# Patient Record
Sex: Female | Born: 1940
Health system: Southern US, Community
[De-identification: ages and names within clinical notes are randomized; demographics above are authoritative.]

## PROBLEM LIST (undated history)

## (undated) DIAGNOSIS — E119 Type 2 diabetes mellitus without complications: Secondary | ICD-10-CM

## (undated) DIAGNOSIS — M25569 Pain in unspecified knee: Secondary | ICD-10-CM

## (undated) DIAGNOSIS — I10 Essential (primary) hypertension: Secondary | ICD-10-CM

## (undated) DIAGNOSIS — Z923 Personal history of irradiation: Secondary | ICD-10-CM

## (undated) DIAGNOSIS — N3289 Other specified disorders of bladder: Secondary | ICD-10-CM

## (undated) DIAGNOSIS — M1990 Unspecified osteoarthritis, unspecified site: Secondary | ICD-10-CM

## (undated) DIAGNOSIS — E785 Hyperlipidemia, unspecified: Secondary | ICD-10-CM

## (undated) DIAGNOSIS — C50919 Malignant neoplasm of unspecified site of unspecified female breast: Secondary | ICD-10-CM

## (undated) DIAGNOSIS — T7840XA Allergy, unspecified, initial encounter: Secondary | ICD-10-CM

## (undated) HISTORY — DX: Other specified disorders of bladder: N32.89

## (undated) HISTORY — DX: Type 2 diabetes mellitus without complications: E11.9

## (undated) HISTORY — DX: Pain in unspecified knee: M25.569

## (undated) HISTORY — DX: Allergy, unspecified, initial encounter: T78.40XA

## (undated) HISTORY — DX: Hyperlipidemia, unspecified: E78.5

## (undated) HISTORY — DX: Unspecified osteoarthritis, unspecified site: M19.90

## (undated) HISTORY — DX: Malignant neoplasm of unspecified site of unspecified female breast: C50.919

## (undated) HISTORY — DX: Essential (primary) hypertension: I10

## (undated) HISTORY — PX: COLONOSCOPY: SHX174

---

## 1973-11-14 HISTORY — PX: ABDOMINAL HYSTERECTOMY: SHX81

## 1985-11-14 HISTORY — PX: BREAST LUMPECTOMY: SHX2

## 1986-07-15 HISTORY — PX: MASTECTOMY PARTIAL / LUMPECTOMY W/ AXILLARY LYMPHADENECTOMY: SUR852

## 1998-02-19 ENCOUNTER — Encounter: Admission: RE | Admit: 1998-02-19 | Discharge: 1998-05-20 | Payer: Self-pay | Admitting: General Surgery

## 1998-11-14 HISTORY — PX: SHOULDER ARTHROSCOPY: SHX128

## 1999-09-02 ENCOUNTER — Ambulatory Visit (HOSPITAL_BASED_OUTPATIENT_CLINIC_OR_DEPARTMENT_OTHER): Admission: RE | Admit: 1999-09-02 | Discharge: 1999-09-02 | Payer: Self-pay | Admitting: Orthopedic Surgery

## 2000-02-24 ENCOUNTER — Encounter: Admission: RE | Admit: 2000-02-24 | Discharge: 2000-02-24 | Payer: Self-pay | Admitting: Internal Medicine

## 2000-02-24 ENCOUNTER — Encounter: Payer: Self-pay | Admitting: Internal Medicine

## 2005-04-05 ENCOUNTER — Ambulatory Visit: Payer: Self-pay | Admitting: Internal Medicine

## 2005-04-18 ENCOUNTER — Ambulatory Visit: Payer: Self-pay | Admitting: Internal Medicine

## 2005-04-21 ENCOUNTER — Ambulatory Visit: Payer: Self-pay | Admitting: Internal Medicine

## 2005-04-27 ENCOUNTER — Ambulatory Visit: Payer: Self-pay | Admitting: Internal Medicine

## 2005-05-10 ENCOUNTER — Encounter (INDEPENDENT_AMBULATORY_CARE_PROVIDER_SITE_OTHER): Payer: Self-pay | Admitting: Specialist

## 2005-05-10 ENCOUNTER — Ambulatory Visit: Payer: Self-pay | Admitting: Internal Medicine

## 2005-07-20 ENCOUNTER — Emergency Department (HOSPITAL_COMMUNITY): Admission: EM | Admit: 2005-07-20 | Discharge: 2005-07-20 | Payer: Self-pay | Admitting: Emergency Medicine

## 2006-01-13 ENCOUNTER — Ambulatory Visit: Payer: Self-pay | Admitting: Internal Medicine

## 2006-05-25 ENCOUNTER — Emergency Department (HOSPITAL_COMMUNITY): Admission: EM | Admit: 2006-05-25 | Discharge: 2006-05-25 | Payer: Self-pay | Admitting: Emergency Medicine

## 2006-05-30 ENCOUNTER — Emergency Department (HOSPITAL_COMMUNITY): Admission: EM | Admit: 2006-05-30 | Discharge: 2006-05-30 | Payer: Self-pay | Admitting: Family Medicine

## 2006-11-08 ENCOUNTER — Ambulatory Visit: Payer: Self-pay | Admitting: Internal Medicine

## 2007-01-01 ENCOUNTER — Ambulatory Visit: Payer: Self-pay | Admitting: Internal Medicine

## 2007-01-01 LAB — CONVERTED CEMR LAB
BUN: 8 mg/dL (ref 6–23)
Basophils Absolute: 0 10*3/uL (ref 0.0–0.1)
Basophils Relative: 0.8 % (ref 0.0–1.0)
CO2: 30 meq/L (ref 19–32)
Calcium: 9.5 mg/dL (ref 8.4–10.5)
Chloride: 109 meq/L (ref 96–112)
Cholesterol: 175 mg/dL (ref 0–200)
Creatinine, Ser: 0.7 mg/dL (ref 0.4–1.2)
Direct LDL: 111.5 mg/dL
Eosinophils Absolute: 0.1 10*3/uL (ref 0.0–0.6)
Eosinophils Relative: 1.6 % (ref 0.0–5.0)
GFR calc Af Amer: 108 mL/min
GFR calc non Af Amer: 89 mL/min
Glucose, Bld: 148 mg/dL — ABNORMAL HIGH (ref 70–99)
HCT: 41 % (ref 36.0–46.0)
HDL: 37.3 mg/dL — ABNORMAL LOW (ref >39.0)
Hemoglobin: 14.4 g/dL (ref 12.0–15.0)
Lymphocytes Relative: 45.6 % (ref 12.0–46.0)
MCHC: 35.1 g/dL (ref 30.0–36.0)
MCV: 91.6 fL (ref 78.0–100.0)
Monocytes Absolute: 0.2 10*3/uL (ref 0.2–0.7)
Monocytes Relative: 4.6 % (ref 3.0–11.0)
Neutro Abs: 2.1 10*3/uL (ref 1.4–7.7)
Neutrophils Relative %: 47.4 % (ref 43.0–77.0)
Platelets: 218 10*3/uL (ref 150–400)
Potassium: 4.2 meq/L (ref 3.5–5.1)
RBC: 4.48 M/uL (ref 3.87–5.11)
RDW: 11.8 % (ref 11.5–14.6)
Sodium: 143 meq/L (ref 135–145)
TSH: 1.37 microintl units/mL (ref 0.35–5.50)
Total CHOL/HDL Ratio: 4.7
Triglycerides: 208 mg/dL (ref 0–149)
VLDL: 42 mg/dL — ABNORMAL HIGH (ref 0–40)
Vitamin B-12: 741 pg/mL (ref 211–911)
WBC: 4.5 10*3/uL (ref 4.5–10.5)

## 2007-08-28 ENCOUNTER — Ambulatory Visit: Payer: Self-pay | Admitting: Internal Medicine

## 2007-09-10 ENCOUNTER — Observation Stay (HOSPITAL_COMMUNITY): Admission: EM | Admit: 2007-09-10 | Discharge: 2007-09-11 | Payer: Self-pay | Admitting: Emergency Medicine

## 2007-09-11 ENCOUNTER — Ambulatory Visit: Payer: Self-pay | Admitting: Internal Medicine

## 2007-09-13 DIAGNOSIS — Z853 Personal history of malignant neoplasm of breast: Secondary | ICD-10-CM

## 2007-09-13 DIAGNOSIS — E785 Hyperlipidemia, unspecified: Secondary | ICD-10-CM | POA: Insufficient documentation

## 2007-09-13 DIAGNOSIS — I10 Essential (primary) hypertension: Secondary | ICD-10-CM

## 2007-09-13 DIAGNOSIS — M1990 Unspecified osteoarthritis, unspecified site: Secondary | ICD-10-CM | POA: Insufficient documentation

## 2007-09-28 ENCOUNTER — Ambulatory Visit: Payer: Self-pay

## 2007-10-01 ENCOUNTER — Ambulatory Visit: Payer: Self-pay | Admitting: Internal Medicine

## 2007-10-01 DIAGNOSIS — E119 Type 2 diabetes mellitus without complications: Secondary | ICD-10-CM

## 2007-10-02 DIAGNOSIS — Z9189 Other specified personal risk factors, not elsewhere classified: Secondary | ICD-10-CM

## 2007-10-26 ENCOUNTER — Ambulatory Visit: Payer: Self-pay | Admitting: Internal Medicine

## 2007-10-26 DIAGNOSIS — T7841XA Arthus phenomenon, initial encounter: Secondary | ICD-10-CM | POA: Insufficient documentation

## 2007-10-26 LAB — CONVERTED CEMR LAB
ALT: 25 units/L (ref 0–35)
AST: 22 units/L (ref 0–37)
Albumin: 3.8 g/dL (ref 3.5–5.2)
Alkaline Phosphatase: 51 units/L (ref 39–117)
Bilirubin, Direct: 0.2 mg/dL (ref 0.0–0.3)
Cholesterol: 115 mg/dL (ref 0–200)
HDL: 33.8 mg/dL — ABNORMAL LOW (ref >39.0)
LDL Cholesterol: 54 mg/dL (ref 0–99)
Total Bilirubin: 0.9 mg/dL (ref 0.3–1.2)
Total CHOL/HDL Ratio: 3.4
Total Protein: 6.7 g/dL (ref 6.0–8.3)
Triglycerides: 135 mg/dL (ref 0–149)
VLDL: 27 mg/dL (ref 0–40)

## 2007-10-28 ENCOUNTER — Encounter: Payer: Self-pay | Admitting: Internal Medicine

## 2007-11-02 ENCOUNTER — Ambulatory Visit: Payer: Self-pay | Admitting: Internal Medicine

## 2008-01-05 ENCOUNTER — Emergency Department (HOSPITAL_COMMUNITY): Admission: EM | Admit: 2008-01-05 | Discharge: 2008-01-05 | Payer: Self-pay | Admitting: Family Medicine

## 2008-01-29 ENCOUNTER — Encounter: Payer: Self-pay | Admitting: Internal Medicine

## 2008-05-23 ENCOUNTER — Telehealth (INDEPENDENT_AMBULATORY_CARE_PROVIDER_SITE_OTHER): Payer: Self-pay | Admitting: *Deleted

## 2008-06-03 ENCOUNTER — Ambulatory Visit: Payer: Self-pay | Admitting: Internal Medicine

## 2008-06-03 DIAGNOSIS — N3289 Other specified disorders of bladder: Secondary | ICD-10-CM

## 2008-06-03 DIAGNOSIS — M25569 Pain in unspecified knee: Secondary | ICD-10-CM

## 2008-08-12 ENCOUNTER — Ambulatory Visit: Payer: Self-pay | Admitting: Internal Medicine

## 2008-10-21 ENCOUNTER — Ambulatory Visit: Payer: Self-pay | Admitting: Internal Medicine

## 2008-10-23 ENCOUNTER — Encounter: Payer: Self-pay | Admitting: Internal Medicine

## 2009-02-11 ENCOUNTER — Encounter: Payer: Self-pay | Admitting: Internal Medicine

## 2009-02-11 LAB — HM MAMMOGRAPHY: HM Mammogram: NORMAL

## 2009-06-08 ENCOUNTER — Ambulatory Visit: Payer: Self-pay | Admitting: Internal Medicine

## 2009-07-30 ENCOUNTER — Ambulatory Visit: Payer: Self-pay | Admitting: Internal Medicine

## 2009-07-30 LAB — CONVERTED CEMR LAB
Albumin: 4.2 g/dL (ref 3.5–5.2)
Alkaline Phosphatase: 62 units/L (ref 39–117)
Basophils Relative: 0.7 % (ref 0.0–3.0)
CO2: 29 meq/L (ref 19–32)
Chloride: 106 meq/L (ref 96–112)
Eosinophils Absolute: 0.1 10*3/uL (ref 0.0–0.7)
HCT: 41.5 % (ref 36.0–46.0)
HDL: 36.6 mg/dL — ABNORMAL LOW (ref >39.00)
Hemoglobin: 14.3 g/dL (ref 12.0–15.0)
LDL Cholesterol: 74 mg/dL (ref 0–99)
MCHC: 34.3 g/dL (ref 30.0–36.0)
MCV: 93 fL (ref 78.0–100.0)
Monocytes Absolute: 0.2 10*3/uL (ref 0.1–1.0)
Neutro Abs: 1.9 10*3/uL (ref 1.4–7.7)
Potassium: 4.3 meq/L (ref 3.5–5.1)
RBC: 4.47 M/uL (ref 3.87–5.11)
Sodium: 141 meq/L (ref 135–145)
Total CHOL/HDL Ratio: 4
Triglycerides: 163 mg/dL — ABNORMAL HIGH (ref 0.0–149.0)

## 2009-08-04 ENCOUNTER — Encounter: Payer: Self-pay | Admitting: Internal Medicine

## 2009-08-11 ENCOUNTER — Telehealth: Payer: Self-pay | Admitting: Internal Medicine

## 2009-08-12 ENCOUNTER — Ambulatory Visit: Payer: Self-pay | Admitting: Internal Medicine

## 2009-08-12 DIAGNOSIS — J309 Allergic rhinitis, unspecified: Secondary | ICD-10-CM | POA: Insufficient documentation

## 2009-08-13 ENCOUNTER — Encounter: Payer: Self-pay | Admitting: Internal Medicine

## 2009-09-07 ENCOUNTER — Telehealth: Payer: Self-pay | Admitting: Internal Medicine

## 2009-09-24 ENCOUNTER — Ambulatory Visit: Payer: Self-pay | Admitting: Internal Medicine

## 2009-09-29 ENCOUNTER — Ambulatory Visit: Payer: Self-pay | Admitting: Internal Medicine

## 2009-09-29 LAB — CONVERTED CEMR LAB: Blood Glucose, AC Bkfst: 153 mg/dL

## 2009-10-14 ENCOUNTER — Ambulatory Visit: Payer: Self-pay | Admitting: Internal Medicine

## 2010-02-04 ENCOUNTER — Ambulatory Visit: Payer: Self-pay | Admitting: Internal Medicine

## 2010-03-25 ENCOUNTER — Encounter: Payer: Self-pay | Admitting: Internal Medicine

## 2010-03-30 ENCOUNTER — Encounter: Payer: Self-pay | Admitting: Internal Medicine

## 2010-04-22 ENCOUNTER — Ambulatory Visit: Payer: Self-pay | Admitting: Internal Medicine

## 2010-04-22 DIAGNOSIS — M545 Low back pain: Secondary | ICD-10-CM | POA: Insufficient documentation

## 2010-04-23 LAB — CONVERTED CEMR LAB
Ketones, ur: NEGATIVE mg/dL
Leukocytes, UA: NEGATIVE
Specific Gravity, Urine: 1.015 (ref 1.000–1.030)
Urine Glucose: NEGATIVE mg/dL
pH: 7 (ref 5.0–8.0)

## 2010-04-27 ENCOUNTER — Encounter (INDEPENDENT_AMBULATORY_CARE_PROVIDER_SITE_OTHER): Payer: Self-pay | Admitting: *Deleted

## 2010-05-19 ENCOUNTER — Encounter (INDEPENDENT_AMBULATORY_CARE_PROVIDER_SITE_OTHER): Payer: Self-pay | Admitting: *Deleted

## 2010-07-01 ENCOUNTER — Encounter (INDEPENDENT_AMBULATORY_CARE_PROVIDER_SITE_OTHER): Payer: Self-pay

## 2010-07-06 ENCOUNTER — Ambulatory Visit: Payer: Self-pay | Admitting: Internal Medicine

## 2010-07-13 ENCOUNTER — Ambulatory Visit: Payer: Self-pay | Admitting: Internal Medicine

## 2010-07-13 LAB — HM COLONOSCOPY

## 2010-07-14 ENCOUNTER — Encounter: Payer: Self-pay | Admitting: Internal Medicine

## 2010-08-19 ENCOUNTER — Ambulatory Visit: Payer: Self-pay | Admitting: Internal Medicine

## 2010-10-11 ENCOUNTER — Ambulatory Visit: Payer: Self-pay | Admitting: Internal Medicine

## 2010-12-16 NOTE — Letter (Signed)
Summary: Previsit letter  [facility]  [address] [place], [place] [address]   Phone: [phone]  Fax: [phone]       05/19/2010 MRN: [id number]  [facility] [address] [place], [place]  [address]  Dear Kelly Bell,  Welcome to the Gastroenterology Division at [facility].    You are scheduled to see a nurse for your pre-procedure visit on 07/06/2010 at 10:30am on the 3rd floor at [facility], [address]  We ask that you try to arrive at our office 15 minutes prior to your appointment time to allow for check-in.  Your nurse visit will consist of discussing your medical and surgical history, your immediate family medical history, and your medications.    Please bring a complete list of all your medications or, if you prefer, bring the medication bottles and we will list them.  We will need to be aware of both prescribed and over the counter drugs.  We will need to know exact dosage information as well.  If you are on blood thinners (Coumadin, Plavix, Aggrenox, Ticlid, etc.) please call our office today/prior to your appointment, as we need to consult with your physician about holding your medication.   Please be prepared to read and sign documents such as consent forms, a financial agreement, and acknowledgement forms.  If necessary, and with your consent, a friend or relative is welcome to sit-in on the nurse visit with you.  Please bring your insurance card so that we may make a copy of it.  If your insurance requires a referral to see a specialist, please bring your referral form from your primary care physician.  No co-pay is required for this nurse visit.     If you cannot keep your appointment, please call [phone] to cancel or reschedule prior to your appointment date.  This allows [place] the opportunity to schedule an appointment for another patient in need of care.    Thank you for choosing [facility] for your medical  needs.  We appreciate the opportunity to care for you.  Please visit [place] at our website  to learn more about our practice.                     Sincerely.                                                                                                                   The Gastroenterology Division

## 2010-12-16 NOTE — Letter (Signed)
Summary: Colonoscopy Letter  [facility]  [address] [place], [place] [address]   Phone: [phone]  Fax: [phone]      April 27, 2010 MRN: [id number]   [facility] [address] [place], [place]  [address]   Dear Ms. Leahy,   According to your medical record, it is time for you to schedule a Colonoscopy. The American Cancer Society recommends this procedure as a method to detect early colon cancer. Patients with a family history of colon cancer, or a personal history of colon polyps or inflammatory bowel disease are at increased risk.  This letter has been generated based on the recommendations made at the time of your procedure. If you feel that in your particular situation this may no longer apply, please contact our office.  Please call our office at [phone] to schedule this appointment or to update your records at your earliest convenience.  Thank you for cooperating with [place] to provide you with the very best care possible.   Sincerely,  Wilhemina Bonito. Marina Goodell, M.D.  [facility] Division [phone]

## 2010-12-16 NOTE — Procedures (Signed)
Summary: Colonoscopy  Patient: Kelly Bell Note: All result statuses are Final unless otherwise noted.  Tests: (1) Colonoscopy (COL)   COL Colonoscopy           DONE     [facility]     [address]     [place], [place]  [address]           COLONOSCOPY PROCEDURE REPORT           PATIENT:  Kelly Bell, Kelly Bell  MR#:  [id number]     BIRTHDATE:  08-Jan-1941, 69 yrs. old  GENDER:  female     ENDOSCOPIST:  Wilhemina Bonito. Eda Keys, MD     REF. BY:  Surveillance Program Recall,     PROCEDURE DATE:  07/13/2010     PROCEDURE:  Colonoscopy with snare polypectomy x 3     ASA CLASS:  Class II     INDICATIONS:  history of pre-cancerous (adenomatous) colon polyps,     surveillance and high-risk screening ; index exam 04-2005 w/ TAs     MEDICATIONS:   Fentanyl 100 mcg IV, Versed 10 mg IV, Benadryl 25     mg IV           DESCRIPTION OF PROCEDURE:   After the risks benefits and     alternatives of the procedure were thoroughly explained, informed     consent was obtained.  Digital rectal exam was performed and     revealed no abnormalities.   The LB [id number] endoscope     was introduced through the anus and advanced to the cecum, which     was identified by both the appendix and ileocecal valve, without     limitations.Time to cecum = 5:08 min.  The quality of the prep was     excellent, using MoviPrep.  The instrument was then slowly     withdrawn (t = 10:48 min) as the colon was fully examined.     <<PROCEDUREIMAGES>>           FINDINGS:  Three polyps (63mm,1mm,3mm) were found in the ascending     colon. Polyps were snared without cautery. Retrieval was     successful. snare polyp  Moderate diverticulosis was found     throughout the colon.   Retroflexed views in the rectum revealed     internal hemorrhoids.    The scope was then withdrawn from the     patient and the procedure completed.           COMPLICATIONS:  None     ENDOSCOPIC IMPRESSION:     1) Three polyps in the ascending colon  - removed     2) Moderate diverticulosis throughout the colon     3) Internal hemorrhoids           RECOMMENDATIONS:     1) Follow up colonoscopy in 5 years           ______________________________     Wilhemina Bonito. Eda Keys, MD           CC:  Jacques Navy, MD;The Patient           n.     Rosalie DoctorWilhemina Bonito. Eda Keys at 07/13/2010 10:28 AM           [place], [place], [phone]  Note: An exclamation mark (!) indicates a result that was not dispersed into the flowsheet. Document Creation Date: 07/13/2010 10:29 AM _______________________________________________________________________  (1) Order result  status: Final Collection or observation date-time: 07/13/2010 10:20 Requested date-time:  Receipt date-time:  Reported date-time:  Referring Physician:   Ordering Physician: Fransico Setters [phone]) Specimen Source:  Source: Launa Grill Order Number: [id number] Lab site:   Appended Document: Colonoscopy     Procedures Next Due Date:    Colonoscopy: 06/2015

## 2010-12-16 NOTE — Letter (Signed)
Summary: Patient Notice- Polyp Results  [facility]  [address] [place], [place] [address]   Phone: [phone]  Fax: [phone]        July 14, 2010 MRN: [id number]    [facility] [address] [place], [place]  [address]    Dear Ms. Kostelnik,  I am pleased to inform you that the colon polyp(s) removed during your recent colonoscopy was (were) found to be benign (no cancer detected) upon pathologic examination.  I recommend you have a repeat colonoscopy examination in 5 years to look for recurrent polyps, as having colon polyps increases your risk for having recurrent polyps or even colon cancer in the future.  Should you develop new or worsening symptoms of abdominal pain, bowel habit changes or bleeding from the rectum or bowels, please schedule an evaluation with either your primary care physician or with me.  Additional information/recommendations:  __ No further action with gastroenterology is needed at this time. Please      follow-up with your primary care physician for your other healthcare      needs.    Please call us if you are having persistent problems or have questions about your condition that have not been fully answered at this time.  Sincerely,  Hilarie Fredrickson MD  This letter has been electronically signed by your physician.  Appended Document: Patient Notice- Polyp Results letter mailed 9.2.11

## 2010-12-16 NOTE — Letter (Signed)
Summary: Diabetic Instructions  [facility]  [address] [place], [place] [address]   Phone: [phone]  Fax: [phone]    Kelly Bell 1940-12-16 MRN: [id number]   _  _   ORAL DIABETIC MEDICATION INSTRUCTIONS  The day before your procedure:   Take your diabetic pill as you do normally  The day of your procedure:   Do not take your diabetic pill    We will check your blood sugar levels during the admission process and again in Recovery before discharging you home  ________________________________________________________________________  _  _   INSULIN (LONG ACTING) MEDICATION INSTRUCTIONS (Lantus, NPH, 70/30, Humulin, Novolin-N)   The day before your procedure:   Take  your regular evening dose    The day of your procedure:   Do not take your morning dose    _  _   INSULIN (SHORT ACTING) MEDICATION INSTRUCTIONS (Regular, Humulog, Novolog)   The day before your procedure:   Do not take your evening dose   The day of your procedure:   Do not take your morning dose   _  _   INSULIN PUMP MEDICATION INSTRUCTIONS  We will contact the physician managing your diabetic care for written dosage instructions for the day before your procedure and the day of your procedure.  Once we have received the instructions, we will contact you.

## 2010-12-16 NOTE — Letter (Signed)
Summary: [facility] Instructions  [facility]  [address] [place], [place] [address]   Phone: [phone]  Fax: [phone]       Kelly Bell    1940-12-08    MRN: [id number]        Procedure Day /Date: Tuesday 07/13/2010     Arrival Time: 8:30 am      Procedure Time: 9:30 am     Location of Procedure:                    _x _  [facility] (4th Floor)                        PREPARATION FOR COLONOSCOPY WITH MOVIPREP   Starting 5 days prior to your procedure Sunday 9/25 do not eat nuts, seeds, popcorn, corn, beans, peas,  salads, or any raw vegetables.  Do not take any fiber supplements (e.g. Metamucil, Citrucel, and Benefiber).  THE DAY BEFORE YOUR PROCEDURE         DATE: Monday 8/29  1.  Drink clear liquids the entire day-NO SOLID FOOD  2.  Do not drink anything colored red or purple.  Avoid juices with pulp.  No orange juice.  3.  Drink at least 64 oz. (8 glasses) of fluid/clear liquids during the day to prevent dehydration and help the prep work efficiently.  CLEAR LIQUIDS INCLUDE: Water Jello Ice Popsicles Tea (sugar ok, no milk/cream) Powdered fruit flavored drinks Coffee (sugar ok, no milk/cream) Gatorade Juice: apple, white grape, white cranberry  Lemonade Clear bullion, consomm, broth Carbonated beverages (any kind) Strained chicken noodle soup Hard Candy                             4.  In the morning, mix first dose of MoviPrep solution:    Empty 1 Pouch A and 1 Pouch B into the disposable container    Add lukewarm drinking water to the top line of the container. Mix to dissolve    Refrigerate (mixed solution should be used within 24 hrs)  5.  Begin drinking the prep at 5:00 p.m. The MoviPrep container is divided by 4 marks.   Every 15 minutes drink the solution down to the next mark (approximately 8 oz) until the full liter is complete.   6.  Follow completed prep with 16 oz of clear liquid of your choice (Nothing red or  purple).  Continue to drink clear liquids until bedtime.  7.  Before going to bed, mix second dose of MoviPrep solution:    Empty 1 Pouch A and 1 Pouch B into the disposable container    Add lukewarm drinking water to the top line of the container. Mix to dissolve    Refrigerate  THE DAY OF YOUR PROCEDURE      DATE: Tuesday 8/30  Beginning at 4:30 a.m. (5 hours before procedure):         1. Every 15 minutes, drink the solution down to the next mark (approx 8 oz) until the full liter is complete.  2. Follow completed prep with 16 oz. of clear liquid of your choice.    3. You may drink clear liquids until 7:30 am (2 HOURS BEFORE PROCEDURE).   MEDICATION INSTRUCTIONS  Unless otherwise instructed, you should take regular prescription medications with a small sip of water   as early as possible the morning of your  procedure.  Diabetic patients - see separate instructions.  Additional medication instructions: Do not take HCTZ am of procedure.         OTHER INSTRUCTIONS  You will need a responsible adult at least 71 years of age to accompany you and drive you home.   This person must remain in the waiting room during your procedure.  Wear loose fitting clothing that is easily removed.  Leave jewelry and other valuables at home.  However, you may wish to bring a book to read or  an iPod/MP3 player to listen to music as you wait for your procedure to start.  Remove all body piercing jewelry and leave at home.  Total time from sign-in until discharge is approximately 2-3 hours.  You should go home directly after your procedure and rest.  You can resume normal activities the  day after your procedure.  The day of your procedure you should not:   Drive   Make legal decisions   Operate machinery   Drink alcohol   Return to work  You will receive specific instructions about eating, activities and medications before you leave.    The above instructions have been  reviewed and explained to me by   Ulis Rias RN  July 06, 2010 10:41 AM    I fully understand and can verbalize these instructions _____________________________ Date _________

## 2010-12-16 NOTE — Assessment & Plan Note (Signed)
Summary: ZOSTAVAX INJ/ MEN /NWS  STATES BCBS WILL PAY/ NWS   Nurse Visit   Allergies: No Known Drug Allergies  Immunizations Administered:  Zostavax # 1:    Vaccine Type: Zostavax    Site: left deltoid    Mfr: Merck    Dose: 0.5 ml    Route: [place]    Given by: Lucious Groves    Exp. Date: 12/11/2010    Lot #: [id number]    VIS given: 08/26/05 given February 04, 2010.  Orders Added: 1)  Zoster (Shingles) Vaccine Live [90736] 2)  Admin 1st Vaccine [phone]

## 2010-12-16 NOTE — Assessment & Plan Note (Signed)
Summary: pain right side/men/cd   Vital Signs:  Patient profile:   70 year old female Height:      65 inches Weight:      239.25 pounds BMI:     39.96 O2 Sat:      96 % on Room air Temp:     98.4 degrees F oral Pulse rate:   71 / minute BP sitting:   148 / 94  (left arm) Cuff size:   large  Vitals Entered By: Lucious Groves (April 22, 2010 11:39 AM)  O2 Flow:  Room air CC: C/o right side pain./kb Is Patient Diabetic? Yes Pain Assessment Patient in pain? yes     Location: right side Intensity: 8 Type: aching Onset of pain  2 weeks Comments Patient notes that she stopped HCTZ and needs new 4mg  prescription for Amaryl./kb   Primary Care Provider:  [facility]  CC:  C/o right side pain./kb.  History of Present Illness: C/o R LS sharp severe pain, nagging x 2 wks: off and on; better w/Ibuprofen. No irrad. Pain 8/10 She needs a new Rx for DM  Preventive Screening-Counseling & Management  Caffeine-Diet-Exercise     Does Patient Exercise: yes  Current Medications (verified): 1)  Aspirin Ec 325 Mg  Tbec (Aspirin) .Marland Kitchen.. 1 By Mouth Once Daily 2)  Hydrochlorothiazide 25 Mg  Tabs (Hydrochlorothiazide) .... Once Daily 3)  Oxybutynin Chloride 5 Mg  Tb24 (Oxybutynin Chloride) .Marland Kitchen.. 1 By Mouth Three Times A Day or Qid As Need For Irritable  Bladder 4)  Simvastatin 80 Mg  Tabs (Simvastatin) .Marland Kitchen.. 1 Every Pm 5)  Metoprolol Tartrate 50 Mg  Tabs (Metoprolol Tartrate) .... Take 1 Tablet By Mouth Once A Day 6)  Amaryl 2 Mg Tabs (Glimepiride) .... 2 Daily 7)  Nasacort Aq 55 Mcg/act Aers (Triamcinolone Acetonide(Nasal)) .Marland Kitchen.. 1 Spray Each Nostril Once Daily  Allergies (verified): No Known Drug Allergies  Past History:  Past Medical History: Last updated: 08/12/2009 Breast cancer, hx of-Right Hyperlipidemia Hypertension diabetes mellitus, type 2 Usual childhood diseases irritable bladder knee pain  Past Surgical History: Last updated: 09/13/2007 Lumpectomy with axillary  dissection Hysterectomy- 1975 Left shoulder arthroscopy-2000 Colonoscopy-05/10/2005  Social History: married 1965 1 son, 1 daughter; 3 grandsons retired-worked in the school system; doing care-giver work for [employer] Regular exercise-yes water aerobics  Review of Systems  The patient denies fever, prolonged cough, abdominal pain, hematuria, and difficulty walking.         No rash  Physical Exam  General:  Overweight AA female in NADalert, well-hydrated, appropriate dress, and normal appearance.   Mouth:  teeth and gums in good repair; mucous membranes moist, without lesions or ulcers. oropharynx clear without exudate, erythema.  Heart:  normal rate, regular rhythm, no murmur, and no rub. BLE without edema. Abdomen:  soft, non-tender, normal bowel sounds, no masses, and no hepatomegaly.   Msk:  Lumbar-sacral spine is tender to palpation over paraspinal muscles and painfull with the ROM on R Neurologic:  alert & oriented X3, cranial nerves II-XII intact, strength normal in all extremities, gait normal, and DTRs symmetrical and normal.   Skin:  turgor normal, color normal, no ulcerations, and no edema.   Psych:  Oriented X3, memory intact for recent and remote, normally interactive, and good eye contact.     Impression & Recommendations:  Problem # 1:  LOW BACK PAIN, ACUTE (ICD-724.2) R, MSK Assessment New See "Patient Instructions".  Her updated medication list for this problem includes:    Aspirin  Ec 325 Mg Tbec (Aspirin) .Marland Kitchen... 1 by mouth once daily    Nabumetone 500 Mg Tabs (Nabumetone) .Marland Kitchen... 1 by mouth two times a day or 2 by mouth once daily pc for arthritis    Tramadol Hcl 50 Mg Tabs (Tramadol hcl) .Marland Kitchen... 1-2 tabs by mouth two times a day as needed pain  Orders: TLB-Udip ONLY (81003-UDIP) T-Lumbar Spine 2 Views (72100TC)  Problem # 2:  DIABETES MELLITUS, MILD (ICD-250.00) Assessment: Comment Only  The following medications were removed from the medication list:     Amaryl 2 Mg Tabs (Glimepiride) .Marland Kitchen... 2 daily Her updated medication list for this problem includes:    Aspirin Ec 325 Mg Tbec (Aspirin) .Marland Kitchen... 1 by mouth once daily    Glimepiride 4 Mg Tabs (Glimepiride) .Marland Kitchen... 1 by mouth once daily for diabetes  Problem # 3:  HYPERTENSION (ICD-401.9) Assessment: Comment Only  Her updated medication list for this problem includes:    Hydrochlorothiazide 25 Mg Tabs (Hydrochlorothiazide) ..... Once daily    Metoprolol Tartrate 50 Mg Tabs (Metoprolol tartrate) .Marland Kitchen... Take 1 tablet by mouth once a day  BP today: 148/94 Prior BP: 120/82 (08/12/2009)  Labs Reviewed: K+: 4.3 (07/30/2009) Creat: : 0.8 (07/30/2009)   Chol: 143 (07/30/2009)   HDL: 36.60 (07/30/2009)   LDL: 74 (07/30/2009)   TG: 163.0 (07/30/2009)  Complete Medication List: 1)  Aspirin Ec 325 Mg Tbec (Aspirin) .Marland Kitchen.. 1 by mouth once daily 2)  Hydrochlorothiazide 25 Mg Tabs (Hydrochlorothiazide) .... Once daily 3)  Oxybutynin Chloride 5 Mg Tb24 (Oxybutynin chloride) .Marland Kitchen.. 1 by mouth three times a day or qid as need for irritable  bladder 4)  Simvastatin 80 Mg Tabs (Simvastatin) .Marland Kitchen.. 1 every pm 5)  Metoprolol Tartrate 50 Mg Tabs (Metoprolol tartrate) .... Take 1 tablet by mouth once a day 6)  Nasacort Aq 55 Mcg/act Aers (Triamcinolone acetonide(nasal)) .Marland Kitchen.. 1 spray each nostril once daily 7)  Nabumetone 500 Mg Tabs (Nabumetone) .Marland Kitchen.. 1 by mouth two times a day or 2 by mouth once daily pc for arthritis 8)  Tramadol Hcl 50 Mg Tabs (Tramadol hcl) .Marland Kitchen.. 1-2 tabs by mouth two times a day as needed pain 9)  Glimepiride 4 Mg Tabs (Glimepiride) .Marland Kitchen.. 1 by mouth once daily for diabetes  Patient Instructions: 1)  Use stretching and balance exercises that I have provided (15 min. or longer every day)  2)  Please schedule a follow-up appointment in 1 month Dr Debby Bud. Prescriptions: GLIMEPIRIDE 4 MG TABS (GLIMEPIRIDE) 1 by mouth once daily for diabetes  #30 x 12   Entered and Authorized by:   Tresa Garter  MD   Signed by:   Tresa Garter MD on 04/22/2010   Method used:   Electronically to        [facility] (retail)       [address]       [place], [place]  [address]       Ph: [phone]       Fax: [phone]   RxID:   [id number] TRAMADOL HCL 50 MG TABS (TRAMADOL HCL) 1-2 tabs by mouth two times a day as needed pain  #60 x 3   Entered and Authorized by:   Tresa Garter MD   Signed by:   Tresa Garter MD on 04/22/2010   Method used:   Electronically to        [facility] (retail)  [address]       [place], [place]  [address]       Ph: [phone]       Fax: [phone]   RxID:   [id number] NABUMETONE 500 MG TABS (NABUMETONE) 1 by mouth two times a day or 2 by mouth once daily pc for arthritis  #60 x 6   Entered and Authorized by:   Tresa Garter MD   Signed by:   Tresa Garter MD on 04/22/2010   Method used:   Electronically to        [facility] (retail)       [address]       [place], [place]  [address]       Ph: [phone]       Fax: [phone]   RxID:   [id number]

## 2010-12-16 NOTE — Assessment & Plan Note (Signed)
Summary: TB TEST / MEN /NWS   Nurse Visit   Allergies: No Known Drug Allergies  Immunizations Administered:  PPD Skin Test:    Vaccine Type: PPD    Site: left forearm    Dose: 0.1 ml    Route: ID    Given by: Lamar Sprinkles, CMA    Exp. Date: 09/26/2011    Lot #: [id number]  PPD Results    Date of reading: 10/13/2010    Results: < 5mm    Interpretation: negative  Orders Added: 1)  TB Skin Test [86580] 2)  Admin 1st Vaccine [90471]   Orders Added: 1)  TB Skin Test [86580] 2)  Admin 1st Vaccine [62130]

## 2010-12-16 NOTE — Assessment & Plan Note (Signed)
Summary: FLU SHOT/NWS  Nurse Visit   Allergies: No Known Drug Allergies  Orders Added: 1)  Flu Vaccine 3yrs + MEDICARE PATIENTS [Q2039] 2)  Administration Flu vaccine - MCR [G0008] Flu Vaccine Consent Questions     Do you have a history of severe allergic reactions to this vaccine? no    Any prior history of allergic reactions to egg and/or gelatin? no    Do you have a sensitivity to the preservative Thimersol? no    Do you have a past history of Guillan-Barre Syndrome? no    Do you currently have an acute febrile illness? no    Have you ever had a severe reaction to latex? no    Vaccine information given and explained to patient? yes    Are you currently pregnant? no    Lot Number:[id number]   Exp Date:05/14/2011   Site Given  Left Deltoid IM 

## 2010-12-16 NOTE — Miscellaneous (Signed)
Summary: Lec previsit  Clinical Lists Changes  Medications: Added new medication of MOVIPREP 100 GM  SOLR (PEG-KCL-NACL-NASULF-NA ASC-C) As per prep instructions. - Signed Rx of MOVIPREP 100 GM  SOLR (PEG-KCL-NACL-NASULF-NA ASC-C) As per prep instructions.;  #1 x 0;  Signed;  Entered by: Ulis Rias RN;  Authorized by: Hilarie Fredrickson MD;  Method used: Electronically to [facility] [address] [place], [place], [place]  [address], Ph: [phone], Fax: [phone] Observations: Added new observation of NKA: T (07/06/2010 10:19)    Prescriptions: MOVIPREP 100 GM  SOLR (PEG-KCL-NACL-NASULF-NA ASC-C) As per prep instructions.  #1 x 0   Entered by:   Ulis Rias RN   Authorized by:   Hilarie Fredrickson MD   Signed by:   Ulis Rias RN on 07/06/2010   Method used:   Electronically to        [facility] (retail)       [address]       [place], [place]  [address]       Ph: [phone]       Fax: [phone]   RxID:   [id number]

## 2011-01-28 LAB — GLUCOSE, CAPILLARY: Glucose-Capillary: 119 mg/dL — ABNORMAL HIGH (ref 70–99)

## 2011-03-03 ENCOUNTER — Ambulatory Visit (INDEPENDENT_AMBULATORY_CARE_PROVIDER_SITE_OTHER): Payer: Medicare Other | Admitting: Internal Medicine

## 2011-03-03 ENCOUNTER — Encounter: Payer: Self-pay | Admitting: Internal Medicine

## 2011-03-03 VITALS — BP 138/80 | HR 60 | Temp 98.7°F | Wt 236.0 lb

## 2011-03-03 DIAGNOSIS — M76829 Posterior tibial tendinitis, unspecified leg: Secondary | ICD-10-CM

## 2011-03-04 ENCOUNTER — Encounter: Payer: Self-pay | Admitting: Internal Medicine

## 2011-03-04 NOTE — Progress Notes (Signed)
  Subjective:    Patient ID: Kelly Bell, female    DOB: [birth year], 70 y.o.   MRN: [id number]  HPIMrs. Bell presents for a 1-2 week history of pin in the distal right leg. She does not recall any precipitating event. She has had no injury. She denies any shortness of breath or any respiratory difficulty. She describes the pain as sharp and persistent that involves the lateral aspect of the distal right lower extremity up to the level of her knee. She does have pain with weight=bearing but has been unimpeded in ambulation. She reports swelling of both feet but no swelling of the calve RLE.   PMH, FamHx and SocHx reviewed for any changes and relevance.     Review of Systems  HENT: Negative.   Eyes: Negative.   Respiratory: Negative.   Cardiovascular: Negative.   Genitourinary: Negative.   Musculoskeletal:       Per the HPI  Neurological: Negative.        Objective:   Physical Exam Morbidly obese AA woman in no distress HEENT - wnl Chest - CTAP Ext- right Lower extremity with tenderness in the lateral compartment without mass. Tender at the lateral knee at insertion of tibialis muscle. No palpable mass, negative Homan's sign. 2+ dorsalis pedis pulse. 1+ pedal edema bilaterally.       Assessment & Plan:  1. Tibialis anterior tendonitis.  Plan - lineament of choice           OTC NSAID - GI precautions given           Stretch exercises - referred to [email or web address]

## 2011-03-29 NOTE — H&P (Signed)
NAMESHAY, JHAVERI                  ACCOUNT NO.:  [id number]   MEDICAL RECORD NO.:  [id number]          PATIENT TYPE:  INP   LOCATION:  1426                         FACILITY:  [facility]   PHYSICIAN:  Vinnie Level, MD    DATE OF BIRTH:  [birth year]   DATE OF ADMISSION:  09/09/2007  DATE OF DISCHARGE:                              HISTORY & PHYSICAL   [place] HISTORY AND PHYSICAL.   CHIEF COMPLAINT:  Chest pain.   HISTORY OF PRESENT ILLNESS:  This is a 70 year old African-American  female with history of hypertension, hyperlipidemia, who presents with  several hours of sharp 10/10 substernal chest pain that radiates to her  right shoulder.  She notes this started at rest and lasted several  minutes into hours.  Did subside a few times completely, then returned.  She had no associated diaphoresis, nausea or dyspnea with this.  She has  never had similar symptoms in the past.  She previously was doing water  aerobics but no recent exercise or exertional ability to test whether  this pain is with exertion.  She does her routine activities of daily  living around the house, including laundry, without issues.   PAST MEDICAL HISTORY:  1. Breast cancer.  2. Hypertension.  3. Hyperlipidemia.   CARDIAC RISK FACTORS:  No tobacco.  No family history.  Positive  hypertension, positive hyperlipidemia.  No formal diagnosis of diabetes.   FAMILY HISTORY:  She does have diabetes in both parents.  No frank  coronary disease, however.  There is remote cancer as well and the  patient herself has had breast cancer.   SOCIAL HISTORY:  The patient denies tobacco, alcohol, drug use.  Her  daughter and son are present at the bedside.   REVIEW OF SYSTEMS:  As per HPI.  All other systems are reviewed and are  negative except as noted above.  The patient notes no recent weight loss  or weight gain, fevers, chills, no recent changes in vision, hearing,  swallowing, no chest pain prior to this episode.  No  frank palpitations.  No dyspnea on exertion.  She lays flat in bed.  No orthopnea.  No lower  extremity edema.  She denies any nausea, vomiting, diarrhea.  No change  in bowel habits or bladder habits.  No hematuria. No hematochezia.  Her  bones and joints appear intact. Her mood is normal.  She has no evidence  of depression or anxiety.  She has had no recent easy bruising.  No  polyuria, no polydipsia, no recent changes in her hair.  Again, all  systems are reviewed and negative except as noted above.   MEDICATIONS:  Her medications include:  1. Lovastatin 40 mg p.o. daily.  2. Aspirin 325 mg p.o. daily.  3. Metoprolol 50 mg p.o. daily.  4. She notes she is not taking her hydrochlorothiazide as was      prescribed in the past.  5. She also takes oxybutynin 5 mg p.o. b.i.d. for symptoms with      irritable bladder, but only takes this on an occasional  basis.   ALLERGIES:  No known drug allergies.   PHYSICAL EXAMINATION:  VITAL SIGNS:  Temperature 98.2, blood pressure  ranged from 133/64 to 165/64.  Respiratory rate 26 to20.  Sating 97 to  98% on room air.  GENERAL:  She did initially present with 10/10 chest pain.  Generally,  well-developed, well-nourished obese African-American female in no acute  distress.  She initially complained of 10/10 pain which quickly resolved  with nitro paste placed on her chest.  She has had no further pain since  being here in the emergency department.  She does not appear to be using  accessory respiratory muscles.  HEENT:  Normocephalic, atraumatic.  Extraocular movements intact.  Pupils equal, round, reactive to light and accommodation.  NECK:  No jugular venous distention.  No bruits.  No cervical  adenopathy.  CARDIOVASCULAR:  Regular rate and rhythm.  Mild bradycardia at times.  No murmurs, rubs or gallops.  LUNGS:  Clear to auscultation bilaterally.  ABDOMEN:  Obese, nontender.  Nondistended.  Unable to reproduce any pain  with palpation.   EXTREMITIES:  Trace edema bilaterally.  MUSCULOSKELETAL:  All bones are palpated and within normal limits.  NEUROLOGICAL:  Cranial nerves II-XII grossly intact.  Good strength x4.  EXTREMITIES:  Skin without rash.   LABORATORY DATA:  An EKG appears to be sinus bradycardia with no ST or T  wave changes.  Normal axis.  Normal intervals.  A chest x-ray reveals  bibasilar atelectasis but no acute other pulmonary disease.  White count  is 6.7, hemoglobin is 13.9, hematocrit 39.8, platelet count 212,000.  Neutrophils are 40%, lymphocytes 52%, monocytes 6%, eosinophils 1%.  A D-  dimer is normal at 0.37.  Myoglobin 60.3.  CK-MB is less than 1.0 and  troponin is less than 0.05, both of which are normal.  BMP reveals  sodium 140, potassium 4.2, chloride 107, bicarb 25, BUN 11, creatinine  0.8, glucose 105, calcium 10.1.   EMERGENCY ROOM COURSE:  In the emergency department she was given  aspirin, sublingual  nitroglycerin, nitro paste and oxygen.   ASSESSMENT/PLAN:  This is a 70 year old African-American female with  hypertension, hyperlipidemia presenting with nonspecific chest pain with  normal electrocardiogram and negative enzymes x1 set.   PROBLEM #1:  CHEST PAIN:  Rule out myocardial infarction with telemetry,  electrocardiogram, serial cardiac enzymes.  Will continue with aspirin  and beta blocker.  She appears to be well beta-blocked at the present  time with heart rate less than 60.  Will keep her NPO for likely a  stress test in the morning, (DSE versus ESE).  Sublingual nitroglycerin  p.r.n.  I see no need for anticoagulation with Lovenox at the present  time.   PROBLEM #2:  HYPERLIPIDEMIA:  Continue with her Statin.  Check fasting  lipid profile.   PROBLEM #3:  PROPHYLAXIS:  Lovenox subcutaneously.   PROBLEM #4:  ENDOCRINE:  Check hemoglobin A1C.   PROBLEM #5:  FLUIDS, ELECTROLYTES, NUTRITION:  As I stated above, NPO.  Electrolytes otherwise appear to be within normal  limits.   DISPOSITION:  Telemetry bed, [place] attending Dr. Felicity Coyer.      Vinnie Level, MD  Electronically Signed     PMB/MEDQ  D:  09/10/2007  T:  09/10/2007  Job:  [id number]

## 2011-03-29 NOTE — Discharge Summary (Signed)
NAME:  Kelly Bell, Kelly Bell                  ACCOUNT NO.:  [id number]   MEDICAL RECORD NO.:  [id number]          PATIENT TYPE:  INP   LOCATION:  1426                         FACILITY[facility]   PHYSICIAN:  Valerie A. Felicity Coyer, MDDATE OF BIRTH:  November 12, 1941   DATE OF ADMISSION:  09/10/2007  DATE OF DISCHARGE:  09/11/2007                               DISCHARGE SUMMARY   DISCHARGE DIAGNOSES:  1. Chest pain, atypical.  Rule out myocardial infarction negative per      outpatient Cardiolite.  2. History of hypertension.  3. History of dyslipidemia.  4. History of breast cancer.   DISCHARGE MEDICATIONS:  1. Aspirin 325 mg once daily.  2. Metoprolol 50 mg once daily.  3. Lovastatin 40 mg once daily.   DISPOSITION:  The patient is discharged home in medically stable  condition.  She is asymptomatic and has had no recurrent chest pressure  or pain during this hospitalization.  Hospital followup is scheduled  with primary care physician, Dr. Illene Regulus, for Wednesday, November  5th, at 4:00 p.m.  She will also be contacted by [facility] for  a scheduled exercise Cardiolite in the coming week.  She is instructed  that if she does not receive information regarding this appointment, she  should call their office or primary MD to find more information  regarding scheduling.  She is also instructed that if she has recurrent  pain or trouble breathing, she should call MD or return to the emergency  room for further evaluation.   HOSPITAL COURSE:  Chest pain.  The patient is a 70 year old woman with  hypertension and dyslipidemia, but who has no other known history of  coronary disease, presented to the emergency room late the evening of  the 26the and was admitted on the 27th for evaluation of sharp  substernal chest pain.  The symptoms came and left suddenly, and without  associated symptoms of diaphoresis, nausea, shortness of breath, or neck  or arm pain.  Initial evaluation was  unremarkable, but she was admitted  for observation to rule out ischemia.  D-dimer was normal at 0.37, so a  CT angio was not pursued, and she remained hemodynamically stable with  normal O2 sats and no recurrence of the symptoms during this time.  She  was monitor on tele which was unremarkable, and serial cardiac enzymes  also returned without evidence of cardiac ischemia.  As such, given the  resolution of her symptoms, hemodynamic stability and negative workup at  this time, she is being arranged for an exercise Cardiolite to be  performed on an outpatient basis by [facility], as well as close  followup with her primary MD.  Please see details above regarding the  scheduling of these.  She will continue her home medications as before  per management of her  hypertension and dyslipidemia.  It should be noted a fasting lipid  profile was checked and showed suboptimal control with a total  cholesterol of 208, triglycerides of 239, HDL of 40 and LDL of 120.  I  will, however, not change  statin, and defer this to her primary MD in  followup next week.  All other medical issues are stable or as listed.Vikki Ports A. Felicity Coyer, MD  Electronically Signed     VAL/MEDQ  D:  09/11/2007  T:  09/11/2007  Job:  [id number]

## 2011-04-01 NOTE — Assessment & Plan Note (Signed)
[facility]                           PRIMARY CARE OFFICE NOTE   NAME:Kelly Bell, Kelly Bell                         MRN:          [id number]  DATE:01/01/2007                            DOB:          [birth year]    Kelly Bell is a 70 year old African American woman who presents for a  followup evaluation and exam. She is followed for hyperlipidemia and  hypertension.  Patient was last seen in the office January 13, 2006 for  headache, evaluated by Dr. Jonny Ruiz, treated for otitis media with a Z-Pak.   PAST MEDICAL HISTORY:   SURGICAL:  1. Lumpectomy with axillary dissection.  2. Vaginal hysterectomy in 1975.  3. Left shoulder arthroscopy in 2000.   MEDICAL ILLNESS:  1. Usual childhood diseases.  2. Hyperlipidemia.  3. Hypertension.  4. Degenerative joint disease.  5. Breast cancer on the right.   CURRENT MEDICATIONS:  1. Aspirin 325 mg daily.  2. Metoprolol 50 mg daily.  3. Lovastatin 40 mg daily.   CHIEF COMPLAINT:  Patient reports she is fatigued. She reports that she  has frequency and urgency with micturition, which is somewhat  problematic for her.   HEALTH MAINTENANCE:  The patient's last mammogram was March of 2007 and  was unremarkable. Last colonoscopy was June of 2006, and was significant  for colon polyps that returned as 1 adenomatous polyp and 1 polypoid  fragment and she is to be a 5-year recall.   SOCIAL HISTORY:  Patient is now retired since 2003. She reports she  tired of being retired and is looking for some type of part time  volunteer work. [facility] [employer] participating in  water aerobics on a regular basis. She reports her marriage is in good  health. She does continue to garden.   REVIEW OF SYSTEMS:  Negative for any constitutional, Cardiovascular,  respiratory, GI, or GU problems except for urinary frequency. Patient is  due for ophthalmologic exam.   PHYSICAL EXAMINATION:  Temperature 98.5, blood pressure  172/82, pulse  60, weight 232.  GENERAL APPEARANCE: An overweight African American woman in no acute  distress.  HEENT EXAM: Normocephalic, atraumatic, EACs and TMs were normal.  Oropharynx with full dentures.  No buccal lesions were noted. Posterior  pharynx was clear.  Conjunctivae and sclera were clear. PERRLA: EOMI  funduscopic exam was unremarkable.  NECK: Supple without thyromegaly. Nodes, no adenopathy was noted in the  cervical supraclavicular regions.  CHEST:  No CVA tenderness, no deformity.  LUNGS: Clear with no rales, wheezes, or rhonchi.  BREAST EXAM: Skin was normal, nipples without discharge, patient has a  well-healed biopsy excisional scar from the 12 to 3 o'clock position on  the right breast. There are no fixed masses, lesions, or abnormalities,  but some mild fibrocystic changes.  CARDIOVASCULAR: 2+ radial pulse, no JVD or carotid bruits. She had a  quiet precordium with regular rate and rhythm, without murmurs, rubs, or  gallops.  ABDOMEN: Obese, palpation of inner organs was hindered by her girth.  PELVIC EXAM: Deferred to patient being status post hysterectomy.  EXTREMITIES: Without clubbing, cyanosis, edema, or deformity.  NEUROLOGIC EXAM: Non focal.   Laboratory ordered and pending includes a lipid panel, basic metabolic  panel for follow up of her hypertension, TSH, CBC, and B12 for her  chronic fatigue.   A 12-lead electrocardiogram revealed a normal sinus bradycardia. She  does have some increased voltage suggestive of LDH, secondary to  hypertension.   ASSESSMENT/PLAN:  1. Hypertension, patient is suboptimally controlled. Plan is to add      hydrochlorothiazide 12.5 mg q. day. and she will continue      metoprolol. She will have to have followup blood pressure checks at      her convenience.  2. Hyperlipidemia, laboratories ordered and pending. We will make      recommendations based on lab results.  3. Genitourinary, the patient with symptoms  consistent with irritable      bladder. She is given a trail of Vesicare. If she sees improvement      in her symptoms she will fill a prescription for oxybutynin to take      5 mg b.i.d. For excessive dryness she may cut back to 5 mg q. a.m.  4. Oncology, patient is a survivor. Mammograms have been normal.      Physical exam is normal.  5. Orthopedics, patient is stable. She is having no significant joint      pain. She is participating in water aerobics and doing well.   SUMMARY:  This is a very pleasant woman with problems as outlined above,  she is given refill prescriptions. Laboratories as ordered as above and  she will be notified by [facility] of the results. I have encouraged the  patient to continue with her water aerobics and exercise. Patient is  asked to return to see me in 1 year or on a p.r.n. basis.     Rosalyn Gess Norins, MD  Electronically Signed   MEN/MedQ  DD: 01/01/2007  DT: 01/01/2007  Job #: [id number]   cc:   Ms. Karolee Ohs

## 2011-05-19 ENCOUNTER — Other Ambulatory Visit: Payer: Self-pay | Admitting: Internal Medicine

## 2011-05-21 ENCOUNTER — Other Ambulatory Visit: Payer: Self-pay | Admitting: Internal Medicine

## 2011-06-07 ENCOUNTER — Other Ambulatory Visit: Payer: Self-pay | Admitting: Internal Medicine

## 2011-06-07 NOTE — Telephone Encounter (Signed)
Rx Done,

## 2011-07-13 ENCOUNTER — Other Ambulatory Visit: Payer: Self-pay | Admitting: Internal Medicine

## 2011-07-22 ENCOUNTER — Encounter: Payer: Self-pay | Admitting: Internal Medicine

## 2011-08-03 ENCOUNTER — Ambulatory Visit (INDEPENDENT_AMBULATORY_CARE_PROVIDER_SITE_OTHER): Payer: Medicare Other | Admitting: *Deleted

## 2011-08-03 DIAGNOSIS — Z23 Encounter for immunization: Secondary | ICD-10-CM

## 2011-08-16 ENCOUNTER — Inpatient Hospital Stay (INDEPENDENT_AMBULATORY_CARE_PROVIDER_SITE_OTHER)
Admission: RE | Admit: 2011-08-16 | Discharge: 2011-08-16 | Disposition: A | Discharge status: Home / Self Care | Payer: Medicare Other | Source: Ambulatory Visit | Attending: Family Medicine | Admitting: Family Medicine

## 2011-08-16 DIAGNOSIS — T22019A Burn of unspecified degree of unspecified forearm, initial encounter: Secondary | ICD-10-CM

## 2011-08-23 ENCOUNTER — Ambulatory Visit (INDEPENDENT_AMBULATORY_CARE_PROVIDER_SITE_OTHER): Payer: Medicare Other | Admitting: Internal Medicine

## 2011-08-23 VITALS — BP 132/86 | HR 59 | Temp 97.0°F | Wt 236.0 lb

## 2011-08-23 DIAGNOSIS — M79622 Pain in left upper arm: Secondary | ICD-10-CM

## 2011-08-23 DIAGNOSIS — M79609 Pain in unspecified limb: Secondary | ICD-10-CM

## 2011-08-23 DIAGNOSIS — M1990 Unspecified osteoarthritis, unspecified site: Secondary | ICD-10-CM

## 2011-08-23 MED ORDER — HYDROCODONE-ACETAMINOPHEN 5-325 MG PO TABS: 1.0000 | ORAL_TABLET | Freq: Four times a day (QID) | ORAL | Status: AC | PRN

## 2011-08-24 ENCOUNTER — Ambulatory Visit (INDEPENDENT_AMBULATORY_CARE_PROVIDER_SITE_OTHER)
Admission: RE | Admit: 2011-08-24 | Discharge: 2011-08-24 | Disposition: A | Discharge status: Home / Self Care | Payer: Medicare Other | Source: Ambulatory Visit | Attending: Internal Medicine | Admitting: Internal Medicine

## 2011-08-24 DIAGNOSIS — M79609 Pain in unspecified limb: Secondary | ICD-10-CM

## 2011-08-24 DIAGNOSIS — M79622 Pain in left upper arm: Secondary | ICD-10-CM

## 2011-08-24 LAB — DIFFERENTIAL
Basophils Absolute: 0
Basophils Relative: 0
Eosinophils Absolute: 0.1
Eosinophils Relative: 1
Lymphocytes Relative: 52 — ABNORMAL HIGH
Lymphs Abs: 3.5 — ABNORMAL HIGH
Monocytes Absolute: 0.4
Monocytes Relative: 6
Neutro Abs: 2.7
Neutrophils Relative %: 40 — ABNORMAL LOW

## 2011-08-24 LAB — POCT CARDIAC MARKERS
CKMB, poc: <1 — ABNORMAL LOW
Myoglobin, poc: 48.8
Operator id: 4531
Troponin i, poc: <0.05
Troponin i, poc: <0.05

## 2011-08-24 LAB — TROPONIN I

## 2011-08-24 LAB — BASIC METABOLIC PANEL
Chloride: 107
GFR calc non Af Amer: >60
Glucose, Bld: 105 — ABNORMAL HIGH
Potassium: 4.2
Sodium: 140

## 2011-08-24 LAB — CARDIAC PANEL(CRET KIN+CKTOT+MB+TROPI)
CK, MB: 1.7
CK, MB: 2.1
Relative Index: 0.9
Relative Index: 1
Total CK: 179 — ABNORMAL HIGH
Total CK: 205 — ABNORMAL HIGH

## 2011-08-24 LAB — LIPID PANEL
HDL: 40
Total CHOL/HDL Ratio: 5.2
Triglycerides: 239 — ABNORMAL HIGH
VLDL: 48 — ABNORMAL HIGH

## 2011-08-24 LAB — CK TOTAL AND CKMB (NOT AT ARMC)
CK, MB: 2
Relative Index: 1
Total CK: 204 — ABNORMAL HIGH

## 2011-08-24 LAB — CBC
HCT: 39.8
Hemoglobin: 13.9
MCHC: 34.9
MCV: 91
Platelets: 212
RBC: 4.37
RDW: 13.2
WBC: 6.7

## 2011-08-24 LAB — D-DIMER, QUANTITATIVE (NOT AT ARMC): D-Dimer, Quant: 0.37

## 2011-08-25 ENCOUNTER — Other Ambulatory Visit: Payer: Self-pay | Admitting: Internal Medicine

## 2011-08-25 NOTE — Progress Notes (Signed)
  Subjective:    Patient ID: Kelly Bell, female    DOB: 05-Jan-1941, 70 y.o.   MRN: [id number]  HPI Kelly Bell presents with acute pain in the left UE. She reports the pai radiates from the neck to the upper back to the shoulder and down her arm to the elbow. She denies any recent strain or injury. She has a history left shoulder surgery. She has normal strength with her limitation being solely related to pain. She admits to mild paresthesia of the fingers, mostly 1st-3rd. \ I have reviewed the patient's medical history in detail and updated the computerized patient record.    Review of Systems System review is negative for any constitutional, cardiac, pulmonary, GI or neuro symptoms or complaints other than as described in the HPI.     Objective:   Physical Exam Vitals - reviewed and stable Gen'; - overweight AA woman who is uncomfortable Ext - left hand appears normal with normal grip, no joint abnormality. Left wrist appears normal with negative Tinel's and Phalen's signs. There is normal range of motion at the left elbow but she has some pain with movement. There is marked decreased range of active and passive motion. No crepitus is appreciated in the shoulder.        Assessment & Plan:

## 2011-08-25 NOTE — Assessment & Plan Note (Signed)
Left arm pain that has a distribution from neck to hand. The is good strength and normal sensation with negative manuevers for carpal tunnel. Suspect primary shoulder joint derangement vs. Cervical disk disease vs DJD neck.  Plan - c-spine series. If normal will refer to shoulder surgeon.  Addendum- c-spine films w/o DDD or marked DJD.  Plan - patient to see her orthopedist.

## 2011-09-02 ENCOUNTER — Telehealth: Payer: Self-pay | Admitting: *Deleted

## 2011-09-02 DIAGNOSIS — M1990 Unspecified osteoarthritis, unspecified site: Secondary | ICD-10-CM

## 2011-09-02 NOTE — Telephone Encounter (Signed)
Pt calling for xray results °

## 2011-09-02 NOTE — Telephone Encounter (Signed)
Called pt. Still having shoulder pain. C-spine was normal. Norco was too strong. Not taking tramadol. Not taking nabumetone  Plan refer to ortho

## 2011-11-16 ENCOUNTER — Telehealth: Payer: Self-pay | Admitting: *Deleted

## 2011-11-16 MED ORDER — OXYBUTYNIN CHLORIDE 5 MG PO TABS: 5.0000 mg | ORAL_TABLET | Freq: Four times a day (QID) | ORAL | Status: DC | PRN

## 2011-11-16 NOTE — Telephone Encounter (Signed)
11/16/2011 

## 2011-12-27 ENCOUNTER — Other Ambulatory Visit: Payer: Self-pay | Admitting: Internal Medicine

## 2012-02-23 ENCOUNTER — Other Ambulatory Visit: Payer: Self-pay | Admitting: Internal Medicine

## 2012-03-12 ENCOUNTER — Other Ambulatory Visit: Payer: Self-pay | Admitting: Internal Medicine

## 2012-04-01 ENCOUNTER — Other Ambulatory Visit: Payer: Self-pay | Admitting: Internal Medicine

## 2012-04-20 ENCOUNTER — Other Ambulatory Visit: Payer: Self-pay | Admitting: Internal Medicine

## 2012-04-20 NOTE — Telephone Encounter (Signed)
Rx refill sent to [facility]

## 2012-06-22 ENCOUNTER — Other Ambulatory Visit (INDEPENDENT_AMBULATORY_CARE_PROVIDER_SITE_OTHER): Payer: Medicare Other

## 2012-06-22 ENCOUNTER — Encounter: Payer: Self-pay | Admitting: Internal Medicine

## 2012-06-22 ENCOUNTER — Ambulatory Visit (INDEPENDENT_AMBULATORY_CARE_PROVIDER_SITE_OTHER): Payer: Medicare Other | Admitting: Internal Medicine

## 2012-06-22 VITALS — BP 122/80 | HR 62 | Temp 97.7°F | Resp 16 | Wt 233.0 lb

## 2012-06-22 DIAGNOSIS — Z853 Personal history of malignant neoplasm of breast: Secondary | ICD-10-CM

## 2012-06-22 DIAGNOSIS — E669 Obesity, unspecified: Secondary | ICD-10-CM

## 2012-06-22 DIAGNOSIS — E785 Hyperlipidemia, unspecified: Secondary | ICD-10-CM

## 2012-06-22 DIAGNOSIS — E119 Type 2 diabetes mellitus without complications: Secondary | ICD-10-CM

## 2012-06-22 DIAGNOSIS — Z0000 Encounter for general adult medical examination without abnormal findings: Secondary | ICD-10-CM

## 2012-06-22 DIAGNOSIS — I10 Essential (primary) hypertension: Secondary | ICD-10-CM

## 2012-06-22 LAB — LIPID PANEL
Cholesterol: 154 mg/dL (ref 0–200)
HDL: 40.4 mg/dL (ref >39.00)
Total CHOL/HDL Ratio: 4
Triglycerides: 135 mg/dL (ref 0.0–149.0)

## 2012-06-22 LAB — CBC WITH DIFFERENTIAL/PLATELET
Basophils Relative: 0.8 % (ref 0.0–3.0)
Eosinophils Absolute: 0.1 10*3/uL (ref 0.0–0.7)
Eosinophils Relative: 1.2 % (ref 0.0–5.0)
Lymphocytes Relative: 45 % (ref 12.0–46.0)
MCHC: 33.6 g/dL (ref 30.0–36.0)
Neutrophils Relative %: 47.8 % (ref 43.0–77.0)
Platelets: 177 10*3/uL (ref 150.0–400.0)
RBC: 4.36 Mil/uL (ref 3.87–5.11)
WBC: 4.2 10*3/uL — ABNORMAL LOW (ref 4.5–10.5)

## 2012-06-22 LAB — COMPREHENSIVE METABOLIC PANEL
ALT: 23 U/L (ref 0–35)
BUN: 12 mg/dL (ref 6–23)
CO2: 27 mEq/L (ref 19–32)
Calcium: 9.4 mg/dL (ref 8.4–10.5)
Creatinine, Ser: 0.7 mg/dL (ref 0.4–1.2)
GFR: 100.66 mL/min (ref >60.00)
Total Bilirubin: 0.7 mg/dL (ref 0.3–1.2)

## 2012-06-22 LAB — HEPATIC FUNCTION PANEL
AST: 18 U/L (ref 0–37)
Alkaline Phosphatase: 62 U/L (ref 39–117)
Bilirubin, Direct: 0.1 mg/dL (ref 0.0–0.3)
Total Bilirubin: 0.7 mg/dL (ref 0.3–1.2)

## 2012-06-22 NOTE — Patient Instructions (Addendum)
Good exam today.   Weight management: the key is following the 3 rules and PATIENCE. 1. Smart food choices - go for the low fat option. Watch out for empty calories - junk!! 2. PORTION SIZE CONTROL - use your hand as a guide to the right amount to eat 3. Keep up the regular exercise.  Target weight - 180 lbs;  Goal - loose 1-2 lbs/month.  This is a 18 -36 month project.   For lab today - with full report to follow.

## 2012-06-22 NOTE — Progress Notes (Signed)
Subjective:    Patient ID: Kelly Bell, female    DOB: 08-Nov-1941, 71 y.o.   MRN: [id number]  HPI  The patient is here for annual Medicare wellness examination and management of other chronic and acute problems.   CC: swelling of the nose, rhinorrhea and swelling and pain in the right ear. Feels like her ear is clogged but no hearing loss. No fever or chills. She has tried zyrtec does help with her symptoms.   The risk factors are reflected in the social history.  The roster of all physicians providing medical care to patient - is listed in the Snapshot section of the chart.  Activities of daily living:  The patient is 100% inedpendent in all ADLs: dressing, toileting, feeding as well as independent mobility  Home safety : The patient has smoke detectors in the home. They wear seatbelts. Falls -does need to make improvements reduce risk of falls. No firearms at home  There is no risks for hepatitis, STDs or HIV. There is no   history of blood transfusion. They have no travel history to infectious disease endemic areas of the world.  The patient has dentures and has not seen their dentist. They have  seen their eye doctor in the last year. They deny any hearing difficulty and have not had audiologic testing in the last year.    They do not  have excessive sun exposure. Discussed the need for sun protection: hats, long sleeves and use of sunscreen if there is significant sun exposure.   Diet: the importance of a healthy diet is discussed. They do have a healthy diet.  The patient has a regular exercise program: water aerobics  , 45 min duration, 3 per week.  The benefits of regular aerobic exercise were discussed.  Depression screen: there are no signs or vegative symptoms of depression- irritability, change in appetite, anhedonia, sadness/tearfullness.  Cognitive assessment: the patient manages all their financial and personal affairs and is actively engaged.   The following portions  of the patient's history were reviewed and updated as appropriate: allergies, current medications, past family history, past medical history,  past surgical history, past social history  and problem list.  Vision, hearing, body mass index were assessed and reviewed.   During the course of the visit the patient was educated and counseled about appropriate screening and preventive services including : fall prevention , diabetes screening, nutrition counseling, colorectal cancer screening, and recommended immunizations.  Past Medical History  Diagnosis Date  . Breast cancer     Right  . Hyperlipidemia   . HTN (hypertension)   . Type II or unspecified type diabetes mellitus without mention of complication, not stated as uncontrolled   . Irritable bladder   . Knee pain    Past Surgical History  Procedure Date  . Mastectomy partial / lumpectomy w/ axillary lymphadenectomy   . Abdominal hysterectomy 1975  . Shoulder arthroscopy 2000  . Colonoscopy 05/10/05   Family History  Problem Relation Age of Onset  . Diabetes Mother   . Pancreatic cancer Mother   . Cancer Mother   . Diabetes Father   . Heart failure Father   . Heart disease Father     CHF  . Cancer Sister     breast  . Cancer Brother     oral cancer  . Cancer Sister     breast   History   Social History  . Marital Status: Married    Spouse Name: N/A  Number of Children: 2  . Years of Education: 12   Occupational History  . 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 Smoking status: Never Smoker   . Smokeless tobacco: Never Used  . Alcohol Use: No  . Drug Use: No  . Sexually Active: Not Currently   Other Topics Concern  . Not on file   Social History Narrative   HSG. Married 1965. 1 son - '68, 1 daughter-'72; 3 grandsons. Retired - mfg, Technical sales engineer. SO - pretty good health.   Current Outpatient Prescriptions on File Prior to Visit  Medication Sig  Dispense Refill  . aspirin 325 MG tablet Take 325 mg by mouth daily.        Marland Kitchen glimepiride (AMARYL) 4 MG tablet TAKE ONE TABLET BY MOUTH EVERY DAY FOR DIABETES  30 tablet  6  . hydrochlorothiazide (HYDRODIURIL) 25 MG tablet TAKE ONE TABLET BY MOUTH EVERY DAY  30 tablet  11  . metoprolol (LOPRESSOR) 50 MG tablet TAKE ONE TABLET BY MOUTH EVERY DAY  30 tablet  6  . nabumetone (RELAFEN) 500 MG tablet TAKE ONE TABLET BY MOUTH TWICE DAILY OR  TWO  TABLETS  ONCE  DAILY  AFTER  A  MEAL  FOR  ARTHRITIS  60 tablet  11  . oxybutynin (DITROPAN) 5 MG tablet TAKE ONE TABLET BY MOUTH 4 TIMES DAILY AS NEEDED  120 tablet  5  . simvastatin (ZOCOR) 80 MG tablet TAKE ONE TABLET BY MOUTH EVERY DAY IN THE EVENING  30 tablet  5  . traMADol (ULTRAM) 50 MG tablet Take 50-100 mg by mouth 2 (two) times daily as needed.             Review of Systems Constitutional:  Negative for fever, chills, activity change and unexpected weight change.  HEENT:  Negative for hearing loss, ear pain, congestion, neck stiffness and postnasal drip. Negative for sore throat or swallowing problems. Negative for dental complaints.   Eyes: Negative for vision loss or change in visual acuity.  Respiratory: Negative for chest tightness and wheezing. Negative for DOE.   Cardiovascular: Negative for chest pain or palpitations. No decreased exercise tolerance Gastrointestinal: No change in bowel habit. No bloating or gas. No reflux or indigestion Genitourinary: Negative for urgency, frequency, flank pain and difficulty urinating.  Musculoskeletal: Negative for myalgias, back pain, arthralgias and gait problem.  Neurological: Negative for dizziness, tremors, weakness and headaches.  Hematological: Negative for adenopathy.  Psychiatric/Behavioral: Negative for behavioral problems and dysphoric mood.       Objective:   Physical Exam Filed Vitals:   06/22/12 0904  BP: 122/80  Pulse: 62  Temp: 97.7 F (36.5 C)  Resp: 16   Wt Readings from  Last 3 Encounters:  06/22/12 233 lb (105.688 kg)  08/23/11 236 lb (107.049 kg)  03/03/11 236 lb (107.049 kg)   Gen'l: well nourished, well developed AA woman in no distress HEENT - Page/AT, EACs/TMs normal, oropharynx with full dentures, no buccal or palatal lesions, posterior pharynx clear, mucous membranes moist. Tender to palpation over the right TMJ. C&S clear, PERRLA, fundi - normal Neck - supple, no thyromegaly Nodes- negative submental, cervical, supraclavicular regions Chest - no deformity, no CVAT Lungs - cleat without rales, wheezes. No increased work of breathing Breast - s/p mastectomy right, manual exam deferred to up coming mammogram Cardiovascular - regular rate and rhythm, quiet precordium, no murmurs, rubs or gallops, 2+ radial, DP  and PT pulses Abdomen - BS+ x 4, no HSM, no guarding or rebound or tenderness Pelvic - deferred -aged out, s/p hysterectomy Rectal - deferred  Extremities - no clubbing, cyanosis, edema or deformity.  Neuro - A&O x 3, CN II-XII normal, motor strength normal and equal, DTRs 2+ and symmetrical biceps, radial, and patellar tendons. Cerebellar - no tremor, no rigidity, fluid movement and normal gait. Foot - normal light touch, pin prick and deep vibratory sensation. Derm - Head, neck, back, abdomen and extremities without suspicious lesions  Lab Results  Component Value Date   WBC 4.2* 06/22/2012   HGB 13.8 06/22/2012   HCT 41.0 06/22/2012   PLT 177.0 06/22/2012   GLUCOSE 166* 06/22/2012   CHOL 154 06/22/2012   TRIG 135.0 06/22/2012   HDL 40.40 06/22/2012   LDLDIRECT 111.5 01/01/2007   LDLCALC 87 06/22/2012        ALT 23 06/22/2012   AST 18 06/22/2012        NA 141 06/22/2012   K 4.5 06/22/2012   CL 107 06/22/2012   CREATININE 0.7 06/22/2012   BUN 12 06/22/2012   CO2 27 06/22/2012   TSH 1.29 06/22/2012   HGBA1C 7.4* 06/22/2012         Assessment & Plan:

## 2012-06-23 DIAGNOSIS — E669 Obesity, unspecified: Secondary | ICD-10-CM | POA: Insufficient documentation

## 2012-06-23 DIAGNOSIS — Z0000 Encounter for general adult medical examination without abnormal findings: Secondary | ICD-10-CM | POA: Insufficient documentation

## 2012-06-23 NOTE — Assessment & Plan Note (Signed)
A1C is above goal of 7% or less but below threshold for changing medication. Currently taking low dose amaryl.  Plan Continue present medication  Attention to adherence to NO SUGAR low carb diet.

## 2012-06-23 NOTE — Assessment & Plan Note (Signed)
LDL better than goal of 100 or less; HDL at goal of 40 or higher. Liver functions are normal. Patient has been on high dose simvastatin for more than 1 year w/o adverse side effects  Plan Continue present medication.

## 2012-06-23 NOTE — Assessment & Plan Note (Signed)
Kelly Bell is aware of the health risk posed by her weight and wants to loose weight. Discussed the principles of weight management: smart food choices, PORTION SIZE CONTROL, regular exercise -continuing water exercise.  Plan:  Target weight 180 lbs  Goal - to loose 1-2 lbs per month - 18-36 month project to reach target weight.

## 2012-06-23 NOTE — Assessment & Plan Note (Signed)
Doing well. Current with mammography.

## 2012-06-23 NOTE — Assessment & Plan Note (Signed)
BP Readings from Last 3 Encounters:  06/22/12 122/80  08/23/11 132/86  03/03/11 138/80   Good control on present medications. Lab results normal: electrolytes and kidney function.  Plan continue present medications

## 2012-06-23 NOTE — Assessment & Plan Note (Signed)
Interval history w/o major illness, injury or surgery. Limited physical exam, sans pelvic/breast, is normal except for weight. Lab results are in normal range except for A1C. She is current with colorectal cancer screening and mammography. Immunizations are up todate.  In summary - a very pleasant woman who appears medically stable. She is committed to weight loss and will continue her present exercise.

## 2012-07-27 ENCOUNTER — Encounter: Payer: Self-pay | Admitting: Internal Medicine

## 2012-08-07 ENCOUNTER — Other Ambulatory Visit: Payer: Self-pay | Admitting: Internal Medicine

## 2012-09-13 ENCOUNTER — Ambulatory Visit (INDEPENDENT_AMBULATORY_CARE_PROVIDER_SITE_OTHER): Payer: Medicare Other | Admitting: General Practice

## 2012-09-13 DIAGNOSIS — Z23 Encounter for immunization: Secondary | ICD-10-CM

## 2012-10-19 ENCOUNTER — Other Ambulatory Visit: Payer: Self-pay | Admitting: Internal Medicine

## 2012-10-20 ENCOUNTER — Other Ambulatory Visit: Payer: Self-pay | Admitting: Internal Medicine

## 2013-02-19 ENCOUNTER — Other Ambulatory Visit: Payer: Self-pay | Admitting: Internal Medicine

## 2013-03-05 ENCOUNTER — Other Ambulatory Visit (INDEPENDENT_AMBULATORY_CARE_PROVIDER_SITE_OTHER): Payer: 59

## 2013-03-05 ENCOUNTER — Ambulatory Visit (INDEPENDENT_AMBULATORY_CARE_PROVIDER_SITE_OTHER): Payer: 59 | Admitting: Internal Medicine

## 2013-03-05 ENCOUNTER — Encounter: Payer: Self-pay | Admitting: Internal Medicine

## 2013-03-05 VITALS — BP 112/80 | HR 63 | Temp 98.3°F | Ht 67.0 in | Wt 234.2 lb

## 2013-03-05 DIAGNOSIS — E119 Type 2 diabetes mellitus without complications: Secondary | ICD-10-CM

## 2013-03-05 DIAGNOSIS — J309 Allergic rhinitis, unspecified: Secondary | ICD-10-CM

## 2013-03-05 DIAGNOSIS — E785 Hyperlipidemia, unspecified: Secondary | ICD-10-CM

## 2013-03-05 DIAGNOSIS — I10 Essential (primary) hypertension: Secondary | ICD-10-CM

## 2013-03-05 DIAGNOSIS — R609 Edema, unspecified: Secondary | ICD-10-CM

## 2013-03-05 LAB — CBC WITH DIFFERENTIAL/PLATELET
Basophils Relative: 0.8 % (ref 0.0–3.0)
Eosinophils Relative: 1.8 % (ref 0.0–5.0)
Lymphocytes Relative: 43.2 % (ref 12.0–46.0)
Neutrophils Relative %: 48.8 % (ref 43.0–77.0)
Platelets: 195 10*3/uL (ref 150.0–400.0)
RBC: 4.47 Mil/uL (ref 3.87–5.11)
WBC: 4.9 10*3/uL (ref 4.5–10.5)

## 2013-03-05 LAB — URINALYSIS, ROUTINE W REFLEX MICROSCOPIC
Hgb urine dipstick: NEGATIVE
Nitrite: NEGATIVE
Specific Gravity, Urine: 1.02 (ref 1.000–1.030)
Total Protein, Urine: NEGATIVE
Urine Glucose: 100
Urobilinogen, UA: 0.2 (ref 0.0–1.0)

## 2013-03-05 LAB — BASIC METABOLIC PANEL
BUN: 11 mg/dL (ref 6–23)
CO2: 27 mEq/L (ref 19–32)
Chloride: 105 mEq/L (ref 96–112)
Creatinine, Ser: 0.8 mg/dL (ref 0.4–1.2)
Glucose, Bld: 178 mg/dL — ABNORMAL HIGH (ref 70–99)

## 2013-03-05 LAB — HEPATIC FUNCTION PANEL
ALT: 20 U/L (ref 0–35)
AST: 19 U/L (ref 0–37)
Albumin: 4.1 g/dL (ref 3.5–5.2)
Total Protein: 6.9 g/dL (ref 6.0–8.3)

## 2013-03-05 LAB — HEMOGLOBIN A1C: Hgb A1c MFr Bld: 7.8 % — ABNORMAL HIGH (ref 4.6–6.5)

## 2013-03-05 LAB — LIPID PANEL
Cholesterol: 139 mg/dL (ref 0–200)
LDL Cholesterol: 79 mg/dL (ref 0–99)
Triglycerides: 135 mg/dL (ref 0.0–149.0)

## 2013-03-05 MED ORDER — HYDROCHLOROTHIAZIDE 25 MG PO TABS: ORAL_TABLET | ORAL | Status: DC

## 2013-03-05 MED ORDER — FLUTICASONE PROPIONATE 50 MCG/ACT NA SUSP: 2.0000 | Freq: Every day | NASAL | Status: DC

## 2013-03-05 NOTE — Assessment & Plan Note (Signed)
Mild uncontrolled, for a1c fu, may likely need further OHA for a1c > 7

## 2013-03-05 NOTE — Assessment & Plan Note (Signed)
stable overall by history and exam, recent data reviewed with pt, and pt to continue medical treatment as before,  to f/u any worsening symptoms or concerns BP Readings from Last 3 Encounters:  03/05/13 112/80  06/22/12 122/80  08/23/11 132/86

## 2013-03-05 NOTE — Progress Notes (Signed)
Subjective:    Patient ID: Kelly Bell, female    DOB: January 05, 1941, 72 y.o.   MRN: [id number]  HPI  Here to f/u as Dr Debby Bud is out of the office; overall doing ok,  Pt denies chest pain, increased sob or doe, wheezing, orthopnea, PND, palpitations, dizziness or syncope.  Pt denies polydipsia, polyuria, or low sugar symptoms such as weakness or confusion improved with po intake.  Pt denies new neurological symptoms such as new headache, or facial or extremity weakness or numbness.   Pt states overall good compliance with meds, has been trying to follow lower cholesterol, diabetic diet, with wt overall stable,  but little exercise however, but trying to do [facility]ob[facility]  CBG's in AM about 150-170 (rare 200) in the am.  Has had mild pedal edema new onest per pt for "quite a while" (cant say how long, gradual onset since last august), goes away at night, but persistent even at night for the last 2-3 days.  Epson salt soaks not working. Admits to more salt in diet recently for the holidays, not sure about prior.  No overt bleeding or bruising, no know worsening renal or hepatic fxn, and overall good compliance with meds.  Was taking HCTZ but stopped on her own months ago, b/c did not like the diuretic effect, seemed to cause polyuria.  Does have several wks ongoing nasal allergy symptoms with clearish congestion, itch and sneezing, without fever, pain, ST, cough, swelling or wheezing. Past Medical History  Diagnosis Date  . Breast cancer     Right  . Hyperlipidemia   . HTN (hypertension)   . Type II or unspecified type diabetes mellitus without mention of complication, not stated as uncontrolled   . Irritable bladder   . Knee pain    Past Surgical History  Procedure Laterality Date  . Mastectomy partial / lumpectomy w/ axillary lymphadenectomy    . Abdominal hysterectomy  1975  . Shoulder arthroscopy  2000  . Colonoscopy  05/10/05    reports that she has never smoked. She  has never used smokeless tobacco. She reports that she does not drink alcohol or use illicit drugs. family history includes Cancer in her brother, mother, and sisters; Diabetes in her father and mother; Heart disease in her father; Heart failure in her father; and Pancreatic cancer in her mother. No Known Allergies Current Outpatient Prescriptions on File Prior to Visit  Medication Sig Dispense Refill  . aspirin 325 MG tablet Take 325 mg by mouth daily.        . metoprolol (LOPRESSOR) 50 MG tablet TAKE ONE TABLET BY MOUTH EVERY DAY  30 tablet  5  . nabumetone (RELAFEN) 500 MG tablet TAKE ONE TABLET BY MOUTH TWICE DAILY OR  TWO  TABLETS  ONCE  DAILY  AFTER  A  MEAL  FOR  ARTHRITIS  60 tablet  11  . oxybutynin (DITROPAN) 5 MG tablet TAKE ONE TABLET BY MOUTH 4 TIMES DAILY AS NEEDED  120 tablet  5  . simvastatin (ZOCOR) 80 MG tablet TAKE ONE TABLET BY MOUTH IN THE EVENING  30 tablet  5  . glimepiride (AMARYL) 4 MG tablet TAKE ONE TABLET BY MOUTH EVERY DAY FOR DIABETES  30 tablet  5  . hydrochlorothiazide (HYDRODIURIL) 25 MG tablet TAKE ONE TABLET BY MOUTH EVERY DAY  30 tablet  11  . traMADol (ULTRAM) 50 MG tablet Take 50-100 mg by mouth 2 (two) times daily as needed.  No current facility-administered medications on file prior to visit.   Review of Systems  Constitutional: Negative for unexpected weight change, or unusual diaphoresis  HENT: Negative for tinnitus.   Eyes: Negative for photophobia and visual disturbance.  Respiratory: Negative for choking and stridor.   Gastrointestinal: Negative for vomiting and blood in stool.  Genitourinary: Negative for hematuria and decreased urine volume.  Musculoskeletal: Negative for acute joint swelling Skin: Negative for color change and wound.  Neurological: Negative for tremors and numbness other than noted  Psychiatric/Behavioral: Negative for decreased concentration or  hyperactivity.       Objective:   Physical Exam BP 112/80  Pulse 63   Temp(Src) 98.3 F (36.8 C) (Oral)  Ht 5\' 7"  (1.702 m)  Wt 234 lb 4 oz (106.255 kg)  BMI 36.68 kg/m2  SpO2 97% VS noted,  Constitutional: Pt appears well-developed and well-nourished.  HENT: Head: NCAT.  Right Ear: External ear normal.  Left Ear: External ear normal.  Eyes: Conjunctivae and EOM are normal. Pupils are equal, round, and reactive to light.  Bilat tm's with mild erythema.  Max sinus areas non tender.  Pharynx with mild erythema, no exudate Neck: Normal range of motion. Neck supple.  Cardiovascular: Normal rate and regular rhythm.   Pulmonary/Chest: Effort normal and breath sounds normal.  Neurological: Pt is alert. Not confused  Skin: Skin is warm. No erythema. trace pedal edema bilat noted Psychiatric: Pt behavior is normal. Thought content normal.     Assessment & Plan:

## 2013-03-05 NOTE — Assessment & Plan Note (Signed)
Recent worsening seems correllated with stopping the HCTZ 25 and possible more salt in diet, in the setting of prob mild venous insuff, no other known card/pulm/renal/hepatic/anemia dz or other.  For simple re-start hct at 12.5 qd which she will hopefully tolerate better, and not overtx the BP

## 2013-03-05 NOTE — Assessment & Plan Note (Signed)
Already taking zyrtec otc with mild uncontrolled seasonal flare - for flonase asd,  to f/u any worsening symptoms or concerns

## 2013-03-05 NOTE — Assessment & Plan Note (Signed)
stable overall by history and exam, recent data reviewed with pt, and pt to continue medical treatment as before,  to f/u any worsening symptoms or concerns Lab Results  Component Value Date   LDLCALC 87 06/22/2012

## 2013-03-05 NOTE — Patient Instructions (Addendum)
OK to re-start the fluid pill at HALF per day (a new prescription is sent to the pharmacy) Please take all new medication as prescribed - the flonase for nasal allergies Please continue all other medications as before Please go to the LAB in the Basement (turn left off the elevator) for the tests to be done today You will be contacted by phone if any changes need to be made immediately.  Otherwise, you will receive a letter about your results with an explanation, but please check with MyChart first. Thank you for enrolling in MyChart. Please follow the instructions below to securely access your online medical record. MyChart allows you to send messages to your doctor, view your test results, renew your prescriptions, schedule appointments, and more. To Log into My Chart online, please go by [employer] or [employer] to [email or web address], or download the MyChart App from the [employer] .  Your Username is: Juliahna (password [email or web address]) Please return in 6 months, or sooner if needed, with Lab testing done 3-5 days before, to Dr Debby Bud

## 2013-03-06 ENCOUNTER — Other Ambulatory Visit: Payer: Self-pay | Admitting: Internal Medicine

## 2013-03-06 MED ORDER — METFORMIN HCL ER 500 MG PO TB24: 500.0000 mg | ORAL_TABLET | Freq: Every day | ORAL | Status: DC

## 2013-03-14 ENCOUNTER — Telehealth: Payer: Self-pay | Admitting: Internal Medicine

## 2013-03-14 NOTE — Telephone Encounter (Signed)
She should stop metformin if she has stomach symptoms, I do not use glyburide, please follow up soon.

## 2013-03-14 NOTE — Telephone Encounter (Signed)
Caller: Kelly Bell/Patient; Phone: [phone]; Reason for Call: Patient recently started taking Metformin for blood sugar control.  She is having issues with her stomach since starting Metformin.  She also wants to know if she should continue taking the Glyburide for her diabetes.  This is the medication she was taking before beginning Metformin.  She attempted to use MyChart, but was not successful in sending a message.  Please contact her to give further instructions.

## 2013-03-15 ENCOUNTER — Encounter: Payer: Self-pay | Admitting: Internal Medicine

## 2013-03-16 ENCOUNTER — Emergency Department (INDEPENDENT_AMBULATORY_CARE_PROVIDER_SITE_OTHER)
Admission: EM | Admit: 2013-03-16 | Discharge: 2013-03-16 | Disposition: A | Discharge status: Home / Self Care | Payer: Medicare Other | Source: Home / Self Care

## 2013-03-16 ENCOUNTER — Emergency Department (INDEPENDENT_AMBULATORY_CARE_PROVIDER_SITE_OTHER): Payer: Medicare Other

## 2013-03-16 ENCOUNTER — Encounter (HOSPITAL_COMMUNITY): Payer: Self-pay | Admitting: Emergency Medicine

## 2013-03-16 DIAGNOSIS — IMO0001 Reserved for inherently not codable concepts without codable children: Secondary | ICD-10-CM

## 2013-03-16 DIAGNOSIS — S40019A Contusion of unspecified shoulder, initial encounter: Secondary | ICD-10-CM

## 2013-03-16 DIAGNOSIS — M791 Myalgia, unspecified site: Secondary | ICD-10-CM

## 2013-03-16 DIAGNOSIS — S40011A Contusion of right shoulder, initial encounter: Secondary | ICD-10-CM

## 2013-03-16 MED ORDER — HYDROCODONE-ACETAMINOPHEN 5-325 MG PO TABS: 1.0000 | ORAL_TABLET | ORAL | Status: DC | PRN

## 2013-03-16 NOTE — ED Provider Notes (Signed)
Medical screening examination/treatment/procedure(s) were performed by non-physician practitioner and as supervising physician I was immediately available for consultation/collaboration.   [facility]; MD  Sharin Grave, MD 03/16/13 2004

## 2013-03-16 NOTE — ED Notes (Signed)
Pt c/o right shoulder injury. Pt states that she loss balance and tripped and fell on right shoulder. Unable to lift arm above head. Pt has not tried anything for treatment.

## 2013-03-16 NOTE — ED Provider Notes (Signed)
History     CSN: [id number]  Arrival date & time 03/16/13  1602   None     Chief Complaint  Patient presents with  . Arm Injury    right arm injury to day several hours ago. loss balance and tripped falling on right arm.     (Consider location/radiation/quality/duration/timing/severity/associated sxs/prior treatment) HPI Comments: 72 year old obese female fell on her right shoulder today. She is complaining of pain in the right upper arm right shoulder. Denies injury to the head neck, left upper extremity or other areas. She is fully ambulatory in no distress.   Past Medical History  Diagnosis Date  . Breast cancer     Right  . Hyperlipidemia   . HTN (hypertension)   . Type II or unspecified type diabetes mellitus without mention of complication, not stated as uncontrolled   . Irritable bladder   . Knee pain     Past Surgical History  Procedure Laterality Date  . Mastectomy partial / lumpectomy w/ axillary lymphadenectomy    . Abdominal hysterectomy  1975  . Shoulder arthroscopy  2000  . Colonoscopy  05/10/05    Family History  Problem Relation Age of Onset  . Diabetes Mother   . Pancreatic cancer Mother   . Cancer Mother   . Diabetes Father   . Heart failure Father   . Heart disease Father     CHF  . Cancer Sister     breast  . Cancer Brother     oral cancer  . Cancer Sister     breast    History  Substance Use Topics  . Smoking status: Never Smoker   . Smokeless tobacco: Never Used  . Alcohol Use: No    OB History   Grav Para Term Preterm Abortions TAB SAB Ect Mult Living                  Review of Systems  Constitutional: Negative for fever, chills and activity change.  HENT: Negative.   Respiratory: Negative.   Cardiovascular: Negative.   Musculoskeletal: Positive for myalgias and arthralgias.       As per HPI  Skin: Negative for color change, pallor and rash.  Neurological: Negative.     Allergies  Review of patient's allergies  indicates no known allergies.  Home Medications   Current Outpatient Rx  Name  Route  Sig  Dispense  Refill  . aspirin 325 MG tablet   Oral   Take 325 mg by mouth daily.           . fluticasone (FLONASE) 50 MCG/ACT nasal spray   Nasal   Place 2 sprays into the nose daily.   16 g   2   . glimepiride (AMARYL) 4 MG tablet      TAKE ONE TABLET BY MOUTH EVERY DAY FOR DIABETES   30 tablet   5   . hydrochlorothiazide (HYDRODIURIL) 25 MG tablet      TAKE 1/2 TABLET BY MOUTH EVERY DAY   45 tablet   3   . metFORMIN (GLUCOPHAGE-XR) 500 MG 24 hr tablet   Oral   Take 1 tablet (500 mg total) by mouth daily with breakfast.   90 tablet   3   . metoprolol (LOPRESSOR) 50 MG tablet      TAKE ONE TABLET BY MOUTH EVERY DAY   30 tablet   5   . nabumetone (RELAFEN) 500 MG tablet      TAKE ONE TABLET BY  MOUTH TWICE DAILY OR  TWO  TABLETS  ONCE  DAILY  AFTER  A  MEAL  FOR  ARTHRITIS   60 tablet   11   . oxybutynin (DITROPAN) 5 MG tablet      TAKE ONE TABLET BY MOUTH 4 TIMES DAILY AS NEEDED   120 tablet   5   . simvastatin (ZOCOR) 80 MG tablet      TAKE ONE TABLET BY MOUTH IN THE EVENING   30 tablet   5   . HYDROcodone-acetaminophen (NORCO/VICODIN) 5-325 MG per tablet   Oral   Take 1 tablet by mouth every 4 (four) hours as needed for pain.   15 tablet   0     BP 168/87  Pulse 57  Temp(Src) 98.4 F (36.9 C) (Oral)  Resp 17  SpO2 97%  Physical Exam  Nursing note and vitals reviewed. Constitutional: She is oriented to person, place, and time. She appears well-developed and well-nourished. No distress.  HENT:  Head: Normocephalic and atraumatic.  Eyes: EOM are normal.  Neck: Normal range of motion. Neck supple.  Musculoskeletal:  No shoulder or asymmetry or deformity. Tenderness in the soft tissues of the upper arm, a.c. joint, trapezius muscle and the superior and posterior aspects. Light palpation in any of these areas produces moderate to severe pain for which  she withdrawals. She is unwilling to try range of motion due to pain. She is able to abduct her arm approximately 20. There is tenderness at the a.c. joint but this is not the area of greatest tenderness. Distal neurovascular and sensory is intact.  Neurological: She is alert and oriented to person, place, and time. No cranial nerve deficit.  Skin: Skin is warm and dry.  Psychiatric: She has a normal mood and affect.    ED Course  Procedures (including critical care time)  Labs Reviewed - No data to display Dg Shoulder Right  03/16/2013  *RADIOLOGY REPORT*  Clinical Data: Fall, right shoulder pain  RIGHT SHOULDER - 2+ VIEW  Comparison: None.  Findings: Three views of the right shoulder submitted.  No acute fracture or subluxation.  Surgical clips are noted in the right axilla.  Mild degenerative changes right AC joint.  IMPRESSION: No acute fracture or subluxation.  Mild degenerative changes right AC joint.   Original Report Authenticated By: Natasha Mead, M.D.      1. Shoulder contusion, right, initial encounter   2. Myalgia       MDM  Continue Your nabumetone pain as directed Norco 5 mg one every 4 hours when necessary pain #15 Apply heat to the muscles that are sore. Wear arm sling for the next 4 days. May remove the arm sling to slightly move her shoulder around 4 range of motion. Followup with your doctor mid next week for reevaluation. The pain is located in her trapezius, upper shoulder, shoulder joint and upper arm. Appears to be mostly soft tissue but due to the level of tenderness at any point it is palpated it is difficult to obtain a good assessment.  Hayden Rasmussen, NP 03/16/13 1840

## 2013-04-07 ENCOUNTER — Other Ambulatory Visit: Payer: Self-pay | Admitting: Internal Medicine

## 2013-04-25 ENCOUNTER — Encounter: Payer: Self-pay | Admitting: Internal Medicine

## 2013-05-04 ENCOUNTER — Other Ambulatory Visit: Payer: Self-pay | Admitting: Internal Medicine

## 2013-05-06 ENCOUNTER — Other Ambulatory Visit: Payer: Self-pay | Admitting: Internal Medicine

## 2013-06-03 ENCOUNTER — Other Ambulatory Visit: Payer: Self-pay | Admitting: Internal Medicine

## 2013-06-04 ENCOUNTER — Other Ambulatory Visit: Payer: Self-pay | Admitting: Internal Medicine

## 2013-06-11 ENCOUNTER — Other Ambulatory Visit: Payer: Self-pay | Admitting: Internal Medicine

## 2013-07-29 ENCOUNTER — Encounter: Payer: Self-pay | Admitting: Internal Medicine

## 2013-08-20 ENCOUNTER — Other Ambulatory Visit: Payer: Self-pay | Admitting: Internal Medicine

## 2013-08-30 ENCOUNTER — Encounter: Payer: Self-pay | Admitting: Internal Medicine

## 2013-09-02 ENCOUNTER — Other Ambulatory Visit: Payer: Self-pay

## 2013-09-02 MED ORDER — SIMVASTATIN 80 MG PO TABS: 80.0000 mg | ORAL_TABLET | Freq: Every day | ORAL | Status: DC

## 2013-09-05 ENCOUNTER — Other Ambulatory Visit: Payer: Self-pay | Admitting: Internal Medicine

## 2013-09-05 ENCOUNTER — Ambulatory Visit (INDEPENDENT_AMBULATORY_CARE_PROVIDER_SITE_OTHER): Payer: Medicare Other | Admitting: Internal Medicine

## 2013-09-05 ENCOUNTER — Other Ambulatory Visit (INDEPENDENT_AMBULATORY_CARE_PROVIDER_SITE_OTHER): Payer: Medicare Other

## 2013-09-05 ENCOUNTER — Encounter: Payer: Self-pay | Admitting: Internal Medicine

## 2013-09-05 VITALS — BP 160/90 | HR 59 | Temp 98.1°F | Wt 232.0 lb

## 2013-09-05 DIAGNOSIS — E119 Type 2 diabetes mellitus without complications: Secondary | ICD-10-CM

## 2013-09-05 DIAGNOSIS — E669 Obesity, unspecified: Secondary | ICD-10-CM

## 2013-09-05 DIAGNOSIS — E785 Hyperlipidemia, unspecified: Secondary | ICD-10-CM

## 2013-09-05 DIAGNOSIS — Z23 Encounter for immunization: Secondary | ICD-10-CM

## 2013-09-05 DIAGNOSIS — I10 Essential (primary) hypertension: Secondary | ICD-10-CM

## 2013-09-05 LAB — COMPREHENSIVE METABOLIC PANEL
AST: 20 U/L (ref 0–37)
Albumin: 4 g/dL (ref 3.5–5.2)
BUN: 9 mg/dL (ref 6–23)
Calcium: 9.4 mg/dL (ref 8.4–10.5)
Chloride: 107 mEq/L (ref 96–112)
Glucose, Bld: 167 mg/dL — ABNORMAL HIGH (ref 70–99)
Potassium: 4.6 mEq/L (ref 3.5–5.1)

## 2013-09-05 LAB — HEPATIC FUNCTION PANEL
ALT: 22 U/L (ref 0–35)
AST: 20 U/L (ref 0–37)
Albumin: 4 g/dL (ref 3.5–5.2)
Total Protein: 7 g/dL (ref 6.0–8.3)

## 2013-09-05 LAB — LIPID PANEL: Cholesterol: 147 mg/dL (ref 0–200)

## 2013-09-05 MED ORDER — PIOGLITAZONE HCL 30 MG PO TABS: 30.0000 mg | ORAL_TABLET | Freq: Every day | ORAL | Status: DC

## 2013-09-05 NOTE — Patient Instructions (Signed)
Good to see you.  Heart and lungs are fine. Normal sensation in the feet.  Routine lab ordered today and results will be on MyChart.  Diabetes - will check the A1C today and make recommendations based on the results. If too high will add an oral medication - most likely Januvia, a DPP4.  Health maintenance - up to date. Will give flu shot today.

## 2013-09-05 NOTE — Progress Notes (Signed)
Subjective:    Patient ID: Kelly Bell, female    DOB: [birth year], 72 y.o.   MRN: [id number]  HPI [name] presents for follow up. She is followed for DM and HTN. Metformin was intolerable but she has continued with amaryl 4 mg. She has been to the eye doctor in the last 12 months.  Blood pressure - high today and in May. She reports better control at home.  Past Medical History  Diagnosis Date  . Breast cancer     Right  . Hyperlipidemia   . HTN (hypertension)   . Type II or unspecified type diabetes mellitus without mention of complication, not stated as uncontrolled   . Irritable bladder   . Knee pain    Past Surgical History  Procedure Laterality Date  . Mastectomy partial / lumpectomy w/ axillary lymphadenectomy    . Abdominal hysterectomy  1975  . Shoulder arthroscopy  2000  . Colonoscopy  05/10/05   Family History  Problem Relation Age of Onset  . Diabetes Mother   . Pancreatic cancer Mother   . Cancer Mother   . Diabetes Father   . Heart failure Father   . Heart disease Father     CHF  . Cancer Sister     breast  . Cancer Brother     oral cancer  . Cancer Sister     breast   History   Social History  . Marital Status: Married    Spouse Name: N/A    Number of Children: 2  . Years of Education: 12   Occupational History  . 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 Smoking status: Never Smoker   . Smokeless tobacco: Never Used  . Alcohol Use: No  . Drug Use: No  . Sexual Activity: Not Currently   Other Topics Concern  . Not on file   Social History Narrative   HSG. Married 1965. 1 son - '68, 1 daughter-'72; 3 grandsons. Retired - mfg, Technical sales engineer. SO - pretty good health.                 Current Outpatient Prescriptions on File Prior to Visit  Medication Sig Dispense Refill  . aspirin 325 MG tablet Take 325 mg by mouth daily.        . fluticasone (FLONASE) 50 MCG/ACT nasal spray  Place 2 sprays into the nose daily.  16 g  2  . glimepiride (AMARYL) 4 MG tablet TAKE ONE TABLET BY MOUTH ONCE DAILY FOR  DIABETES  30 tablet  5  . hydrochlorothiazide (HYDRODIURIL) 25 MG tablet TAKE 1/2 TABLET BY MOUTH EVERY DAY  45 tablet  3  . HYDROcodone-acetaminophen (NORCO/VICODIN) 5-325 MG per tablet Take 1 tablet by mouth every 4 (four) hours as needed for pain.  15 tablet  0  . metoprolol (LOPRESSOR) 50 MG tablet TAKE ONE TABLET BY MOUTH EVERY DAY  30 tablet  5  . nabumetone (RELAFEN) 500 MG tablet TAKE ONE TABLET BY MOUTH TWICE DAILY OR TAKE TWO TABLETS BY MOUTH ONCE A DAY AFTER A MEAL FOR ARTHRITIS  60 tablet  5  . oxybutynin (DITROPAN) 5 MG tablet TAKE ONE TABLET BY MOUTH 4 TIMES DAILY AS NEEDED  120 tablet  1  . simvastatin (ZOCOR) 80 MG tablet Take 1 tablet (80 mg total) by mouth at bedtime.  90 tablet  3  . [DISCONTINUED] oxybutynin (DITROPAN) 5 MG tablet TAKE  ONE TABLET BY MOUTH 4 TIMES DAILY AS NEEDED  120 tablet  5   No current facility-administered medications on file prior to visit.      Review of Systems System review is negative for any constitutional, cardiac, pulmonary, GI or neuro symptoms or complaints other than as described in the HPI.     Objective:   Physical Exam Filed Vitals:   09/05/13 1038  BP: 160/90  Pulse: 59  Temp: 98.1 F (36.7 C)   Wt Readings from Last 3 Encounters:  09/05/13 232 lb (105.235 kg)  03/05/13 234 lb 4 oz (106.255 kg)  06/22/12 233 lb (105.688 kg)   Gen'l - overweight woman in no distress HEENT- C&S clear, PERRLA Cor- 2+ radial and DP pulse, RRR Pulm - normal Neuro - A&O x 3, normal get up and go, normal gait. See DM foot exam.  Recent Results (from the past 2160 hour(s))  HEMOGLOBIN A1C     Status: Abnormal   Collection Time    09/05/13 11:48 AM      Result Value Range   Hemoglobin A1C 8.0 (*) 4.6 - 6.5 %   Comment: Glycemic Control Guidelines for People with Diabetes:Non Diabetic:  <6%Goal of Therapy: <7%Additional  Action Suggested:  >8%   HEPATIC FUNCTION PANEL     Status: None   Collection Time    09/05/13 11:48 AM      Result Value Range   Total Bilirubin 0.7  0.3 - 1.2 mg/dL   Bilirubin, Direct 0.1  0.0 - 0.3 mg/dL   Alkaline Phosphatase 51  39 - 117 U/L   AST 20  0 - 37 U/L   ALT 22  0 - 35 U/L   Total Protein 7.0  6.0 - 8.3 g/dL   Albumin 4.0  3.5 - 5.2 g/dL  LIPID PANEL     Status: None   Collection Time    09/05/13 11:48 AM      Result Value Range   Cholesterol 147  0 - 200 mg/dL   Comment: ATP III Classification       Desirable:  < 200 mg/dL               Borderline High:  200 - 239 mg/dL          High:  > = 578 mg/dL   Triglycerides 469.6  0.0 - 149.0 mg/dL   Comment: Normal:  <295 mg/dLBorderline High:  150 - 199 mg/dL   HDL 28.41  >32.44 mg/dL   VLDL 01.0  0.0 - 27.2 mg/dL   LDL Cholesterol 80  0 - 99 mg/dL   Total CHOL/HDL Ratio 4     Comment:                Men          Women1/2 Average Risk     3.4          3.3Average Risk          5.0          4.42X Average Risk          9.6          7.13X Average Risk          15.0          11.0                      COMPREHENSIVE METABOLIC PANEL     Status: Abnormal   Collection  Time    09/05/13 11:48 AM      Result Value Range   Sodium 141  135 - 145 mEq/L   Potassium 4.6  3.5 - 5.1 mEq/L   Chloride 107  96 - 112 mEq/L   CO2 26  19 - 32 mEq/L   Glucose, Bld 167 (*) 70 - 99 mg/dL   BUN 9  6 - 23 mg/dL   Creatinine, Ser 0.7  0.4 - 1.2 mg/dL   Total Bilirubin 0.7  0.3 - 1.2 mg/dL   Alkaline Phosphatase 51  39 - 117 U/L   AST 20  0 - 37 U/L   ALT 22  0 - 35 U/L   Total Protein 7.0  6.0 - 8.3 g/dL   Albumin 4.0  3.5 - 5.2 g/dL   Calcium 9.4  8.4 - 40.9 mg/dL   GFR 811.91  >47.82 mL/min        Assessment & Plan:

## 2013-09-09 NOTE — Assessment & Plan Note (Signed)
Taking and tolerating high dose simvastatin for greater than 1 year w/o adverse effects. Lab reveals excellent control with LDL 80, HDL 40.4, LFTs normal.  Plan Continue present regimen.

## 2013-09-09 NOTE — Assessment & Plan Note (Signed)
Lab Results  Component Value Date   HGBA1C 8.0* 09/05/2013   Patient was intolerant of metformin.  Plan Continue Glimeperide  Add Actos 30 mg/day  Repeat A1C 3 months

## 2013-09-09 NOTE — Assessment & Plan Note (Signed)
Working on [employer] with some loss.  Plan Continue weight management: Diet management: smart food choices, PORTION SIZE CONTROL, regular exercise. Goal - to loose 1-2 lbs.month. Target weight - 200 lbs

## 2013-09-09 NOTE — Assessment & Plan Note (Signed)
BP Readings from Last 3 Encounters:  09/05/13 160/90  03/16/13 168/87  03/05/13 112/80   Borderline control.   Plan Will continue present medications  Monitor BP at home and report back via MyChart with recommendations to follow

## 2013-09-10 ENCOUNTER — Encounter: Payer: Self-pay | Admitting: Internal Medicine

## 2013-09-28 ENCOUNTER — Other Ambulatory Visit: Payer: Self-pay | Admitting: Internal Medicine

## 2013-10-15 ENCOUNTER — Other Ambulatory Visit: Payer: Self-pay | Admitting: Internal Medicine

## 2013-11-28 ENCOUNTER — Encounter: Payer: Self-pay | Admitting: Internal Medicine

## 2013-11-28 ENCOUNTER — Ambulatory Visit (INDEPENDENT_AMBULATORY_CARE_PROVIDER_SITE_OTHER): Payer: Medicare Other | Admitting: Internal Medicine

## 2013-11-28 VITALS — BP 140/80 | HR 59 | Temp 97.4°F | Wt 240.0 lb

## 2013-11-28 DIAGNOSIS — D489 Neoplasm of uncertain behavior, unspecified: Secondary | ICD-10-CM

## 2013-11-28 DIAGNOSIS — D485 Neoplasm of uncertain behavior of skin: Secondary | ICD-10-CM

## 2013-11-28 NOTE — Progress Notes (Signed)
Pre visit review using our clinic review tool, if applicable. No additional management support is needed unless otherwise documented below in the visit note. 

## 2013-11-30 NOTE — Progress Notes (Signed)
Subjective:    Patient ID: Kelly Bell, female    DOB: [birth year], 73 y.o.   MRN: [id number]  HPI Ms Loring presents for excision of three suspicious black moles on her left chest.  PMH, FamHx and SocHx reviewed for any changes and relevance. Current Outpatient Prescriptions on File Prior to Visit  Medication Sig Dispense Refill  . aspirin 325 MG tablet Take 325 mg by mouth daily.        . fluticasone (FLONASE) 50 MCG/ACT nasal spray USE TWO SPRAY IN EACH NOSTRIL ONCE DAILY  16 g  5  . glimepiride (AMARYL) 4 MG tablet TAKE ONE TABLET BY MOUTH ONCE DAILY FOR  DIABETES  30 tablet  5  . hydrochlorothiazide (HYDRODIURIL) 25 MG tablet TAKE 1/2 TABLET BY MOUTH EVERY DAY  45 tablet  3  . metoprolol (LOPRESSOR) 50 MG tablet TAKE ONE TABLET BY MOUTH EVERY DAY  30 tablet  5  . nabumetone (RELAFEN) 500 MG tablet TAKE ONE TABLET BY MOUTH TWICE DAILY OR TAKE TWO TABLETS BY MOUTH ONCE A DAY AFTER A MEAL FOR ARTHRITIS  60 tablet  5  . oxybutynin (DITROPAN) 5 MG tablet TAKE ONE TABLET BY MOUTH 4 TIMES DAILY AS NEEDED  120 tablet  5  . pioglitazone (ACTOS) 30 MG tablet Take 1 tablet (30 mg total) by mouth daily.  30 tablet  11  . simvastatin (ZOCOR) 80 MG tablet Take 1 tablet (80 mg total) by mouth at bedtime.  90 tablet  3  . [DISCONTINUED] oxybutynin (DITROPAN) 5 MG tablet TAKE ONE TABLET BY MOUTH 4 TIMES DAILY AS NEEDED  120 tablet  5   No current facility-administered medications on file prior to visit.      Review of Systems System review is negative for any constitutional, cardiac, pulmonary, GI or neuro symptoms or complaints other than as described in the HPI.     Objective:   Physical Exam Filed Vitals:   11/28/13 1625  BP: 140/80  Pulse: 59  Temp: 97.4 F (36.3 C)   Gen'l  Overweight woman in no distress Derm - 3 2-3 mm black moles on the left chest, raised, smooth borders, no variegated appearance.   Procedure Note :     Procedure :  Skin biopsy   Indication:  Changing mole  (s ),  Suspicious lesion(s)   Risks including unsuccessful procedure , bleeding, infection, bruising, scar, a need for another complete procedure and others were explained to the patient in detail as well as the benefits. Informed consent was obtained and signed.   The patient was placed in a decubitus position.  Lesion #1 on left chest below clavical    measuring  2 mm   Skin over lesion #1  was prepped with Betadine and alcohol  and anesthetized with 1 cc of 2% lidocaine and epinephrine, using a 25-gauge 1 inch needle.  Shave biopsy with a sterile Dermablade was carried out in the usual fashion. It was attempted to biopsy with  1 mm free margins.  Hyfrecator was used to destroy the rest of the lesion potentially left behind and for hemostasis.     Lesion #2 on left chest left of midline measuring 2-3 mm   Skin over lesion #2  was prepped with Betadine and alcohol  and anesthetized with 1 cc of 2% lidocaine and epinephrine, using a 25-gauge 1 inch needle.  Shave biopsy with a sterile Dermablade was carried out in the usual fashion. It was attempted to biopsy  with  1 mm free margins.  Hyfrecator was used to destroy the rest of the lesion potentially left behind and for hemostasis.  Lesion #3 on left chest mid-clavicular line measuring  2 mm   Skin over lesion #3  was prepped with Betadine and alcohol  and anesthetized with 1 cc of 2% lidocaine and epinephrine, using a 25-gauge 1 inch needle.  Shave biopsy with a sterile Dermablade was carried out in the usual fashion. It was attempted to biopsy with  1 mm free margins.  Hyfrecator was used to destroy the rest of the lesion potentially left behind and for hemostasis.    Tolerated well. Complications none.    Postprocedure instructions :    A Band-Aid should be  changed twice daily. You can take a shower tomorrow.  Keep the wounds clean. You can wash them with liquid soap and water. Pat dry with gauze or a Kleenex tissue  Before applying  antibiotic ointment and a Band-Aid.   You need to report immediately  if fever, chills or any signs of infection develop.    The biopsy results should be available in 1 -2 weeks.        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

## 2013-12-09 ENCOUNTER — Other Ambulatory Visit: Payer: Self-pay | Admitting: Internal Medicine

## 2013-12-10 ENCOUNTER — Other Ambulatory Visit: Payer: Self-pay | Admitting: Internal Medicine

## 2013-12-27 ENCOUNTER — Encounter: Payer: Self-pay | Admitting: Internal Medicine

## 2014-03-30 ENCOUNTER — Other Ambulatory Visit: Payer: Self-pay | Admitting: Internal Medicine

## 2014-03-30 DIAGNOSIS — E785 Hyperlipidemia, unspecified: Secondary | ICD-10-CM

## 2014-03-30 DIAGNOSIS — E1159 Type 2 diabetes mellitus with other circulatory complications: Secondary | ICD-10-CM

## 2014-04-03 ENCOUNTER — Encounter: Payer: Self-pay | Admitting: Internal Medicine

## 2014-04-17 ENCOUNTER — Encounter: Payer: Self-pay | Admitting: Family

## 2014-04-17 ENCOUNTER — Ambulatory Visit (INDEPENDENT_AMBULATORY_CARE_PROVIDER_SITE_OTHER): Payer: Medicare Other | Admitting: Family

## 2014-04-17 VITALS — BP 128/86 | HR 52 | Temp 97.8°F | Resp 16 | Ht 65.0 in | Wt 246.8 lb

## 2014-04-17 DIAGNOSIS — E785 Hyperlipidemia, unspecified: Secondary | ICD-10-CM

## 2014-04-17 DIAGNOSIS — E669 Obesity, unspecified: Secondary | ICD-10-CM

## 2014-04-17 DIAGNOSIS — E1159 Type 2 diabetes mellitus with other circulatory complications: Secondary | ICD-10-CM

## 2014-04-17 DIAGNOSIS — E66812 Obesity, class 2: Secondary | ICD-10-CM

## 2014-04-17 DIAGNOSIS — I10 Essential (primary) hypertension: Secondary | ICD-10-CM

## 2014-04-17 DIAGNOSIS — E119 Type 2 diabetes mellitus without complications: Secondary | ICD-10-CM

## 2014-04-17 LAB — HEPATIC FUNCTION PANEL
ALK PHOS: 44 U/L (ref 39–117)
ALT: 15 U/L (ref 0–35)
AST: 17 U/L (ref 0–37)
Albumin: 4 g/dL (ref 3.5–5.2)
BILIRUBIN DIRECT: 0.1 mg/dL (ref 0.0–0.3)
BILIRUBIN INDIRECT: 0.4 mg/dL (ref 0.2–1.2)
Total Bilirubin: 0.5 mg/dL (ref 0.2–1.2)
Total Protein: 6.3 g/dL (ref 6.0–8.3)

## 2014-04-17 LAB — LIPID PANEL
CHOL/HDL RATIO: 3.3 ratio
Cholesterol: 111 mg/dL (ref 0–200)
HDL: 34 mg/dL — AB (ref >39)
LDL Cholesterol: 58 mg/dL (ref 0–99)
TRIGLYCERIDES: 95 mg/dL (ref <150)
VLDL: 19 mg/dL (ref 0–40)

## 2014-04-17 LAB — MICROALBUMIN / CREATININE URINE RATIO

## 2014-04-17 LAB — BASIC METABOLIC PANEL WITH GFR
BUN: 10 mg/dL (ref 6–23)
CHLORIDE: 108 meq/L (ref 96–112)
CO2: 26 meq/L (ref 19–32)
CREATININE: 0.76 mg/dL (ref 0.50–1.10)
Calcium: 9.3 mg/dL (ref 8.4–10.5)
GFR, Est African American: 89 mL/min
GFR, Est Non African American: 78 mL/min
GLUCOSE: 96 mg/dL (ref 70–99)
Potassium: 4 mEq/L (ref 3.5–5.3)
Sodium: 143 mEq/L (ref 135–145)

## 2014-04-17 LAB — HEMOGLOBIN A1C
HEMOGLOBIN A1C: 5.8 % — AB (ref <5.7)
MEAN PLASMA GLUCOSE: 120 mg/dL — AB (ref <117)

## 2014-04-17 NOTE — Patient Instructions (Signed)
Please complete lab work prior to leaving. Please follow up 3 months.

## 2014-04-17 NOTE — Progress Notes (Addendum)
Subjective:    Patient ID: Kelly Bell, female    DOB: January 18, 1941, 73 y.o.   MRN: [id number]  HPI  Kelly Bell is a 73 yr old female who presents today to establish care.  She has been followed by Dr. Linda Hedges who recently retired.  She denies new concerns except for weight gain.   HTN-  Currently maintained on hctz, metoprolol.  BP Readings from Last 3 Encounters:  04/17/14 128/86  11/28/13 140/80  09/05/13 160/90   DM2-  On actos, amaryl. Reports that she is due for eye exam.  Reports that her glucose machine is not working.  Needs a new machine.  Reports that she had a fall 3 weeks ago.  Hit right side of head and reports that symptoms have resolved. Reports hx of diarrhea on metformin.     Lab Results  Component Value Date   HGBA1C 8.0* 09/05/2013   Hyperlipidemia-  Lab Results  Component Value Date   CHOL 147 09/05/2013   HDL 40.40 09/05/2013   LDLCALC 80 09/05/2013   LDLDIRECT 111.5 01/01/2007   TRIG 131.0 09/05/2013   CHOLHDL 4 09/05/2013   Obesity-   Wt Readings from Last 3 Encounters:  04/17/14 246 lb 12 oz (111.925 kg)  11/28/13 240 lb (108.863 kg)  09/05/13 232 lb (105.235 kg)       Review of Systems See HPI  Past Medical History  Diagnosis Date  . Breast cancer     Right  . Hyperlipidemia   . HTN (hypertension)   . Type II or unspecified type diabetes mellitus without mention of complication, not stated as uncontrolled   . Irritable bladder   . Knee pain     Bilateral    History   Social History  . Marital Status: Married    Spouse Name: N/A    Number of Children: 2  . Years of Education: 12   Occupational History  . 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 Smoking status: Never Smoker   . Smokeless tobacco: Never Used  . Alcohol Use: No  . Drug Use: No  . Sexual Activity: Not Currently   Other Topics Concern  . Not on file   Social History Narrative   HSG. Married 1965. 1 son  - '68, 1 daughter-'72; 3 grandsons. 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                Past Surgical History  Procedure Laterality Date  . Mastectomy partial / lumpectomy w/ axillary lymphadenectomy    . Abdominal hysterectomy  1975  . Shoulder arthroscopy  2000  . Colonoscopy  05/10/05    Family History  Problem Relation Age of Onset  . Diabetes Mother 73    Deceased  . Pancreatic cancer Mother   . Diabetes Father 45    Deceased  . Heart failure Father   . Heart disease Father     CHF  . Breast cancer Sister     x2  . Cancer Brother     oral cancer  . Corneal Dystrophy Son     bilateral  . Healthy Daughter     x1    No Known Allergies  Current Outpatient Prescriptions on File Prior to Visit  Medication Sig Dispense Refill  . aspirin 325 MG tablet Take 325 mg by mouth daily.        . fluticasone (FLONASE)  50 MCG/ACT nasal spray USE TWO SPRAY IN EACH NOSTRIL ONCE DAILY  16 g  5  . glimepiride (AMARYL) 4 MG tablet TAKE ONE TABLET BY MOUTH ONCE DAILY FOR DIABETES  30 tablet  5  . hydrochlorothiazide (HYDRODIURIL) 25 MG tablet TAKE 1/2 TABLET BY MOUTH EVERY DAY  45 tablet  3  . metoprolol (LOPRESSOR) 50 MG tablet TAKE ONE TABLET BY MOUTH ONCE DAILY  30 tablet  5  . nabumetone (RELAFEN) 500 MG tablet TAKE ONE TABLET BY MOUTH TWICE DAILY OR TAKE TWO TABLETS BY MOUTH ONCE A DAY AFTER A MEAL FOR ARTHRITIS  60 tablet  5  . oxybutynin (DITROPAN) 5 MG tablet TAKE ONE TABLET BY MOUTH 4 TIMES DAILY AS NEEDED  120 tablet  5  . pioglitazone (ACTOS) 30 MG tablet Take 1 tablet (30 mg total) by mouth daily.  30 tablet  11  . simvastatin (ZOCOR) 80 MG tablet Take 1 tablet (80 mg total) by mouth at bedtime.  90 tablet  3  . [DISCONTINUED] oxybutynin (DITROPAN) 5 MG tablet TAKE ONE TABLET BY MOUTH 4 TIMES DAILY AS NEEDED  120 tablet  5   No current facility-administered medications on file prior to visit.    BP 128/86  Pulse 52  Temp(Src) 97.8 F (36.6 C)  (Oral)  Resp 16  Ht 5\' 5"  (1.651 m)  Wt 246 lb 12 oz (111.925 kg)  BMI 41.06 kg/m2  SpO2 96%       Objective:   Physical Exam  Constitutional: She is oriented to person, place, and time. She appears well-developed and well-nourished. No distress.  HENT:  Head: Normocephalic and atraumatic.  Cardiovascular: Normal rate and regular rhythm.   No murmur heard. Pulmonary/Chest: Effort normal and breath sounds normal. No respiratory distress. She has no wheezes. She has no rales. She exhibits no tenderness.  Musculoskeletal: She exhibits no edema.  Neurological: She is alert and oriented to person, place, and time.  Psychiatric: She has a normal mood and affect. Her behavior is normal. Judgment and thought content normal.          Assessment & Plan:  Addendum: Rx mailed to pt for glucose meter and test strips 04/18/14

## 2014-04-17 NOTE — Assessment & Plan Note (Signed)
LDL stable.  Continue current dose of simvastatin.

## 2014-04-17 NOTE — Assessment & Plan Note (Signed)
Clinically stable. Advised pt to schedule annual eye exam.  Obtain A1C, refer to nutritionist.

## 2014-04-17 NOTE — Progress Notes (Signed)
Pre visit review using our clinic review tool, if applicable. No additional management support is needed unless otherwise documented below in the visit note/SLS  

## 2014-04-17 NOTE — Assessment & Plan Note (Signed)
BP stable on current meds. Continue same.  

## 2014-04-17 NOTE — Assessment & Plan Note (Signed)
Discussed diet, exercise, weight loss. Refer to nutritionist.

## 2014-04-23 ENCOUNTER — Encounter: Payer: Self-pay | Admitting: Family

## 2014-06-06 LAB — HM DIABETES EYE EXAM

## 2014-06-13 ENCOUNTER — Telehealth: Payer: Self-pay | Admitting: *Deleted

## 2014-06-13 MED ORDER — METOPROLOL TARTRATE 50 MG PO TABS: ORAL_TABLET | ORAL | Status: DC

## 2014-06-13 NOTE — Telephone Encounter (Signed)
Received fax request for metoprolol refill. Refills sent.

## 2014-06-23 ENCOUNTER — Encounter: Payer: Medicare Other | Attending: Family | Admitting: *Deleted

## 2014-06-23 ENCOUNTER — Encounter: Payer: Self-pay | Admitting: *Deleted

## 2014-06-23 VITALS — Ht 65.0 in | Wt 244.2 lb

## 2014-06-23 DIAGNOSIS — Z6841 Body Mass Index (BMI) 40.0 and over, adult: Secondary | ICD-10-CM | POA: Diagnosis not present

## 2014-06-23 DIAGNOSIS — E119 Type 2 diabetes mellitus without complications: Secondary | ICD-10-CM | POA: Insufficient documentation

## 2014-06-23 DIAGNOSIS — E669 Obesity, unspecified: Secondary | ICD-10-CM | POA: Diagnosis not present

## 2014-06-23 DIAGNOSIS — Z713 Dietary counseling and surveillance: Secondary | ICD-10-CM | POA: Insufficient documentation

## 2014-06-23 DIAGNOSIS — E1159 Type 2 diabetes mellitus with other circulatory complications: Secondary | ICD-10-CM

## 2014-06-23 NOTE — Progress Notes (Signed)
Medical Nutrition Therapy:  Appt start time: 1030 end time:  1130.  Assessment:  Patient here today for diabetes and weight management education. Patient has had diabetes for several years. She is on Actos and Amaryl. She checks her BG in the morning with readings <120s. Recent A1c in June was 5.8, which is down from 8.0% in October 2014. She complains of continued weight gain, despite trying to eat healthy and exercising regularly. She recently cut out soda from her diet, and has been reading food labels to compare sugar intake.   MEDICATIONS: See list   DIETARY INTAKE:   Usual eating pattern includes 2-3 meals and 3 snacks per day.  24-hr recall:  B ( AM): Honey nut cheerios, yogurt, coffee (Splenda) Snk ( AM): None  L ( PM): Sometimes skips, apple Snk ( PM): Fruit, peanut butter and crackers, Kettle chips D ( PM): Fried (canola oil) chicken/fish/pork, potatoes (light sour cream)/brown rice/corn, vegetables Snk ( PM): Chips, bite size Snickers Beverages: Cranberry juice, water, iced tea with Splenda  Usual physical activity: [employer], water aerobics, biking, treadmill, 3 days a week  Estimated energy needs: 1500 calories 169 g carbohydrates 94 g protein 50 g fat  Progress Towards Goal(s):  In progress.   Nutritional Diagnosis:  [place]-3.3 Overweight/obesity As related to excessive intake of sweetened drinks and snacks.  As evidenced by BMI >30.    Intervention:  Nutrition counseling. We discussed basic carb counting, including foods with carbs, label reading, portion size, and meal planning. We discussed strategies for weight loss, including balancing nutrients (carbs, protein, fat), portion control, healthy snacks, and exercise.   Goals:  1. 2-3 carb portions per meal, 1 portion per snack 2. Use plate method for meal planning.  3. Limit amount of fat used at meals to no more than 2 tsp.  4. Eat 3 regular meals daily with up to 2-3 individual snacks.  5. Choose healthy snacks, such as  fruit, vegetables, popcorn 6. Continue to limit intake of sweetened drinks.  7. Continue to exercise as currently.   Handouts given during visit include:  Weight loss tips  Meal plan card  Monitoring/Evaluation:  Dietary intake, exercise, BG, and body weight prn.

## 2014-07-03 ENCOUNTER — Other Ambulatory Visit: Payer: Self-pay

## 2014-07-03 ENCOUNTER — Telehealth: Payer: Self-pay | Admitting: Family

## 2014-07-03 MED ORDER — GLIMEPIRIDE 4 MG PO TABS: 4.0000 mg | ORAL_TABLET | Freq: Once | ORAL | Status: DC

## 2014-07-03 NOTE — Telephone Encounter (Signed)
Rx sent to pharmacy. LDM 

## 2014-07-03 NOTE — Telephone Encounter (Signed)
Refill-glimepiride  [facility] on [address]

## 2014-07-04 NOTE — Telephone Encounter (Signed)
Rx sent in 8/20 to [facility].

## 2014-07-07 ENCOUNTER — Encounter: Payer: Self-pay | Admitting: Family

## 2014-07-25 ENCOUNTER — Ambulatory Visit: Payer: Medicare Other | Admitting: Family

## 2014-08-11 ENCOUNTER — Encounter: Payer: Self-pay | Admitting: Family

## 2014-08-18 ENCOUNTER — Ambulatory Visit (INDEPENDENT_AMBULATORY_CARE_PROVIDER_SITE_OTHER): Payer: Medicare Other | Admitting: Family

## 2014-08-18 ENCOUNTER — Encounter: Payer: Self-pay | Admitting: Family

## 2014-08-18 VITALS — BP 120/78 | HR 51 | Temp 98.2°F | Resp 18 | Ht 65.0 in | Wt 247.4 lb

## 2014-08-18 DIAGNOSIS — E119 Type 2 diabetes mellitus without complications: Secondary | ICD-10-CM

## 2014-08-18 DIAGNOSIS — Z23 Encounter for immunization: Secondary | ICD-10-CM

## 2014-08-18 DIAGNOSIS — E785 Hyperlipidemia, unspecified: Secondary | ICD-10-CM

## 2014-08-18 DIAGNOSIS — I10 Essential (primary) hypertension: Secondary | ICD-10-CM

## 2014-08-18 LAB — BASIC METABOLIC PANEL
BUN: 12 mg/dL (ref 6–23)
CO2: 27 meq/L (ref 19–32)
Calcium: 9.2 mg/dL (ref 8.4–10.5)
Chloride: 106 mEq/L (ref 96–112)
Creatinine, Ser: 0.8 mg/dL (ref 0.4–1.2)
GFR: 92.7 mL/min (ref >60.00)
GLUCOSE: 98 mg/dL (ref 70–99)
POTASSIUM: 3.8 meq/L (ref 3.5–5.1)
SODIUM: 138 meq/L (ref 135–145)

## 2014-08-18 LAB — MICROALBUMIN / CREATININE URINE RATIO
Creatinine,U: 128.1 mg/dL
Microalb Creat Ratio: 0.2 mg/g (ref 0.0–30.0)
Microalb, Ur: 0.3 mg/dL (ref 0.0–1.9)

## 2014-08-18 LAB — HEMOGLOBIN A1C: Hgb A1c MFr Bld: 5.8 % (ref 4.6–6.5)

## 2014-08-18 MED ORDER — NABUMETONE 500 MG PO TABS: ORAL_TABLET | ORAL | Status: DC

## 2014-08-18 NOTE — Progress Notes (Signed)
Pre visit review using our clinic review tool, if applicable. No additional management support is needed unless otherwise documented below in the visit note. 

## 2014-08-18 NOTE — Assessment & Plan Note (Signed)
BP is stable on current meds. Obtain follow up bmet.

## 2014-08-18 NOTE — Patient Instructions (Signed)
Please schedule a medicare wellness visit at the front desk. Complete lab work prior to leaving.

## 2014-08-18 NOTE — Assessment & Plan Note (Addendum)
BP stable on current meds. Continue same. Obtain follow a1C, urine microalbumin, prevnar and flu shot today.

## 2014-08-18 NOTE — Progress Notes (Signed)
Subjective:    Patient ID: Kelly Bell, female    DOB: May 31, 1941, 73 y.o.   MRN: [id number]  HPI  Kelly Bell is a 73 yr old female who presents today for follow up.  1) HTN- current bp meds include hctz, metoprolol.  Denies CP or SOB. BP Readings from Last 3 Encounters:  08/18/14 120/78  04/17/14 128/86  11/28/13 140/80    2) DM2-Pt is maintained on actos and amaryl. Reports sugar 102.  Denies symptomatic hypoglycemia.  Due for flu shot and prevnar today.   Lab Results  Component Value Date   HGBA1C 5.8* 04/17/2014   HGBA1C 8.0* 09/05/2013   HGBA1C 7.8* 03/05/2013   Lab Results  Component Value Date   LDLCALC 58 04/17/2014   CREATININE 0.76 04/17/2014   3) Hyperlipidemia- maintained on simvastatin. LDL at goal.  Denies myalgia.     Review of Systems See HPI  Past Medical History  Diagnosis Date  . Breast cancer     Right  . Hyperlipidemia   . HTN (hypertension)   . Type II or unspecified type diabetes mellitus without mention of complication, not stated as uncontrolled   . Irritable bladder   . Knee pain     Bilateral    History   Social History  . Marital Status: Married    Spouse Name: N/A    Number of Children: 2  . Years of Education: 12   Occupational History  . 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 Smoking status: Never Smoker   . Smokeless tobacco: Never Used  . Alcohol Use: No  . Drug Use: No  . Sexual Activity: Not Currently   Other Topics Concern  . Not on file   Social History Narrative   HSG. Married 1965. 1 son - '68, 1 daughter-'72; 3 grandsons. 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                Past Surgical History  Procedure Laterality Date  . Mastectomy partial / lumpectomy w/ axillary lymphadenectomy    . Abdominal hysterectomy  1975  . Shoulder arthroscopy  2000  . Colonoscopy  05/10/05    Family History  Problem Relation Age of Onset  .  Diabetes Mother 74    Deceased  . Pancreatic cancer Mother   . Diabetes Father 75    Deceased  . Heart failure Father   . Heart disease Father     CHF  . Breast cancer Sister     x2  . Cancer Brother     oral cancer  . Corneal Dystrophy Son     bilateral  . Healthy Daughter     x1    No Known Allergies  Current Outpatient Prescriptions on File Prior to Visit  Medication Sig Dispense Refill  . aspirin 325 MG tablet Take 325 mg by mouth daily.        . fluticasone (FLONASE) 50 MCG/ACT nasal spray USE TWO SPRAY IN EACH NOSTRIL ONCE DAILY  16 g  5  . glimepiride (AMARYL) 4 MG tablet Take 1 tablet (4 mg total) by mouth once.  30 tablet  1  . hydrochlorothiazide (HYDRODIURIL) 25 MG tablet TAKE 1/2 TABLET BY MOUTH EVERY DAY  45 tablet  3  . metoprolol (LOPRESSOR) 50 MG tablet TAKE ONE TABLET BY MOUTH ONCE DAILY  30 tablet  2  . nabumetone (RELAFEN) 500  MG tablet TAKE ONE TABLET BY MOUTH TWICE DAILY OR TAKE TWO TABLETS BY MOUTH ONCE A DAY AFTER A MEAL FOR ARTHRITIS  60 tablet  5  . oxybutynin (DITROPAN) 5 MG tablet TAKE ONE TABLET BY MOUTH 4 TIMES DAILY AS NEEDED  120 tablet  5  . pioglitazone (ACTOS) 30 MG tablet Take 1 tablet (30 mg total) by mouth daily.  30 tablet  11  . simvastatin (ZOCOR) 80 MG tablet Take 1 tablet (80 mg total) by mouth at bedtime.  90 tablet  3  . [DISCONTINUED] oxybutynin (DITROPAN) 5 MG tablet TAKE ONE TABLET BY MOUTH 4 TIMES DAILY AS NEEDED  120 tablet  5   No current facility-administered medications on file prior to visit.    BP 120/78  Pulse 51  Temp(Src) 98.2 F (36.8 C) (Oral)  Resp 18  Ht 5\' 5"  (1.651 m)  Wt 247 lb 6.4 oz (112.22 kg)  BMI 41.17 kg/m2  SpO2 100%       Objective:   Physical Exam  Constitutional: She is oriented to person, place, and time. She appears well-developed and well-nourished. No distress.  HENT:  Head: Normocephalic and atraumatic.  Cardiovascular: Normal rate and regular rhythm.   No murmur  heard. Pulmonary/Chest: Effort normal and breath sounds normal. No respiratory distress. She has no wheezes. She has no rales. She exhibits no tenderness.  Neurological: She is alert and oriented to person, place, and time.  Psychiatric: She has a normal mood and affect. Her behavior is normal. Judgment and thought content normal.          Assessment & Plan:

## 2014-08-18 NOTE — Assessment & Plan Note (Signed)
Stable on statin, continue same.  

## 2014-09-05 ENCOUNTER — Other Ambulatory Visit: Payer: Self-pay | Admitting: Family

## 2014-09-10 ENCOUNTER — Encounter: Payer: Self-pay | Admitting: Family

## 2014-09-17 ENCOUNTER — Other Ambulatory Visit: Payer: Self-pay | Admitting: Family

## 2014-09-17 ENCOUNTER — Encounter: Payer: Self-pay | Admitting: Family

## 2014-09-17 ENCOUNTER — Ambulatory Visit (INDEPENDENT_AMBULATORY_CARE_PROVIDER_SITE_OTHER): Payer: Medicare Other | Admitting: Family

## 2014-09-17 VITALS — BP 138/80 | HR 42 | Temp 98.0°F | Resp 16 | Ht 65.0 in | Wt 245.0 lb

## 2014-09-17 DIAGNOSIS — E785 Hyperlipidemia, unspecified: Secondary | ICD-10-CM

## 2014-09-17 DIAGNOSIS — Z0000 Encounter for general adult medical examination without abnormal findings: Secondary | ICD-10-CM

## 2014-09-17 DIAGNOSIS — I10 Essential (primary) hypertension: Secondary | ICD-10-CM

## 2014-09-17 DIAGNOSIS — R001 Bradycardia, unspecified: Secondary | ICD-10-CM | POA: Insufficient documentation

## 2014-09-17 DIAGNOSIS — E119 Type 2 diabetes mellitus without complications: Secondary | ICD-10-CM

## 2014-09-17 DIAGNOSIS — Z13820 Encounter for screening for osteoporosis: Secondary | ICD-10-CM

## 2014-09-17 MED ORDER — PRAVASTATIN SODIUM 80 MG PO TABS: 80.0000 mg | ORAL_TABLET | Freq: Every day | ORAL | Status: DC

## 2014-09-17 MED ORDER — METOPROLOL SUCCINATE ER 25 MG PO TB24: 25.0000 mg | ORAL_TABLET | Freq: Every day | ORAL | Status: DC

## 2014-09-17 MED ORDER — METOPROLOL TARTRATE 25 MG PO TABS: 25.0000 mg | ORAL_TABLET | Freq: Two times a day (BID) | ORAL | Status: DC

## 2014-09-17 NOTE — Patient Instructions (Addendum)
Change metoprolol 50mg  twice daily to 25mg  twice daily.  Stop simvastatin, start pravastatin, call if persistent joint/muscle pain.  Please follow up in 2 weeks for nurse visit. Follow up in 3 months for routine follow up.

## 2014-09-17 NOTE — Assessment & Plan Note (Signed)
Change metoprolol 50mg  daily to 25mg  xl once daily. Follow up in 2 weeks for BP check.

## 2014-09-17 NOTE — Progress Notes (Signed)
Pre visit review using our clinic review tool, if applicable. No additional management support is needed unless otherwise documented below in the visit note. 

## 2014-09-17 NOTE — Progress Notes (Signed)
Subjective:    Kelly Bell is a 73 y.o. female who presents for Medicare Annual/Subsequent preventive examination.  Preventive Screening-Counseling & Management  Tobacco History  Smoking status  . Never Smoker   Smokeless tobacco  . Never Used     Problems Prior to Visit 1. HTN- on metoprolol and hctz.   BP Readings from Last 3 Encounters:  09/17/14 138/80  08/18/14 120/78  04/17/14 128/86   2. Hyperlipidemia- maintained on simvastatin. Reports aching in her bones which she attributes to statin.     3. DM2- reports that her sugar is generally around 100. Maintained on amaryl.  Lab Results  Component Value Date   HGBA1C 5.8 08/18/2014   HGBA1C 5.8* 04/17/2014   HGBA1C 8.0* 09/05/2013   Lab Results  Component Value Date   MICROALBUR 0.3 08/18/2014   LDLCALC 58 04/17/2014   CREATININE 0.8 08/18/2014     Current Problems (verified) Patient Active Problem List   Diagnosis Date Noted  . Edema 03/05/2013  . Obesity, Class II, BMI 35-39.9 06/23/2012  . Routine health maintenance 06/23/2012  . LOW BACK PAIN, ACUTE 04/22/2010  . ALLERGIC RHINITIS 08/12/2009  . Other specified disorders of bladder 06/03/2008  . KNEE PAIN, LEFT 06/03/2008  . 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 CHEST PAIN, ATYPICAL, HX OF 10/02/2007  . Diabetes type 2, controlled 10/01/2007  . Hyperlipemia 09/13/2007  . Essential hypertension 09/13/2007  . DEGENERATIVE JOINT DISEASE 09/13/2007  . BREAST CANCER, HX OF 09/13/2007    Medications Prior to Visit Current Outpatient Prescriptions on File Prior to Visit  Medication Sig Dispense Refill  . aspirin 325 MG tablet Take 325 mg by mouth daily.      . fluticasone (FLONASE) 50 MCG/ACT nasal spray USE TWO SPRAY IN EACH NOSTRIL ONCE DAILY 16 g 5  . glimepiride (AMARYL) 4 MG tablet TAKE ONE TABLET BY MOUTH ONCE DAILY 30 tablet 3  . hydrochlorothiazide (HYDRODIURIL) 25 MG tablet TAKE 1/2 TABLET BY MOUTH EVERY DAY 45 tablet 3  . metoprolol (LOPRESSOR) 50 MG  tablet TAKE ONE TABLET BY MOUTH ONCE DAILY 30 tablet 2  . nabumetone (RELAFEN) 500 MG tablet TAKE ONE TABLET BY MOUTH TWICE DAILY as needed AFTER A MEAL FOR ARTHRITIS 60 tablet 5  . oxybutynin (DITROPAN) 5 MG tablet TAKE ONE TABLET BY MOUTH 4 TIMES DAILY AS NEEDED 120 tablet 5  . pioglitazone (ACTOS) 30 MG tablet Take 1 tablet (30 mg total) by mouth daily. 30 tablet 11  . simvastatin (ZOCOR) 80 MG tablet Take 1 tablet (80 mg total) by mouth at bedtime. 90 tablet 3  . [DISCONTINUED] oxybutynin (DITROPAN) 5 MG tablet TAKE ONE TABLET BY MOUTH 4 TIMES DAILY AS NEEDED 120 tablet 5   No current facility-administered medications on file prior to visit.    Current Medications (verified) Current Outpatient Prescriptions  Medication Sig Dispense Refill  . aspirin 325 MG tablet Take 325 mg by mouth daily.      . fluticasone (FLONASE) 50 MCG/ACT nasal spray USE TWO SPRAY IN EACH NOSTRIL ONCE DAILY 16 g 5  . glimepiride (AMARYL) 4 MG tablet TAKE ONE TABLET BY MOUTH ONCE DAILY 30 tablet 3  . hydrochlorothiazide (HYDRODIURIL) 25 MG tablet TAKE 1/2 TABLET BY MOUTH EVERY DAY 45 tablet 3  . metoprolol (LOPRESSOR) 50 MG tablet TAKE ONE TABLET BY MOUTH ONCE DAILY 30 tablet 2  . nabumetone (RELAFEN) 500 MG tablet TAKE ONE TABLET BY MOUTH TWICE DAILY as needed AFTER A MEAL FOR ARTHRITIS 60 tablet 5  .  oxybutynin (DITROPAN) 5 MG tablet TAKE ONE TABLET BY MOUTH 4 TIMES DAILY AS NEEDED 120 tablet 5  . pioglitazone (ACTOS) 30 MG tablet Take 1 tablet (30 mg total) by mouth daily. 30 tablet 11  . simvastatin (ZOCOR) 80 MG tablet Take 1 tablet (80 mg total) by mouth at bedtime. 90 tablet 3  . [DISCONTINUED] oxybutynin (DITROPAN) 5 MG tablet TAKE ONE TABLET BY MOUTH 4 TIMES DAILY AS NEEDED 120 tablet 5   No current facility-administered medications for this visit.     Allergies (verified) Review of patient's allergies indicates no known allergies.   PAST HISTORY  Family History Family History  Problem Relation  Age of Onset  . Diabetes Mother 3    Deceased  . Pancreatic cancer Mother   . Diabetes Father 60    Deceased  . Heart failure Father   . Heart disease Father     CHF  . Breast cancer Sister     x2  . Cancer Brother     oral cancer  . Corneal Dystrophy Son     bilateral  . Healthy Daughter     x1    Social History History  Substance Use Topics  . Smoking status: Never Smoker   . Smokeless tobacco: Never Used  . Alcohol Use: No     Are there smokers in your home (other than you)? No  Risk Factors Current exercise habits: Gym/ health club routine includes cardio and treadmill. .  Dietary issues discussed:  Healthy diet    Cardiac risk factors: diabetes mellitus, dyslipidemia and hypertension.  Depression Screen (Note: if answer to either of the following is "Yes", a more complete depression screening is indicated)   Over the past two weeks, have you felt down, depressed or hopeless? No  Over the past two weeks, have you felt little interest or pleasure in doing things? No  Have you lost interest or pleasure in daily life? No  Do you often feel hopeless? No  Do you cry easily over simple problems? No  Activities of Daily Living In your present state of health, do you have any difficulty performing the following activities?:  Driving? No Managing money?  No Feeding yourself? No Getting from bed to chair? No. Climbing a flight of stairs? No Preparing food and eating?: No Bathing or showering? No Getting dressed: No Getting to the toilet? No Using the toilet:No Moving around from place to place: No In the past year have you fallen or had a near fall?:Yes, tripped going up the steps.   Are you sexually active?  No  Do you have more than one partner?  No  Hearing Difficulties: No Do you often ask people to speak up or repeat themselves? no Do you experience ringing or noises in your ears? No Do you have difficulty understanding soft or whispered voices?  No   Do you feel that you have a problem with memory? No  Do you often misplace items? No  Do you feel safe at home?  No  Cognitive Testing  Alert? Yes  Normal Appearance?Yes  Oriented to person? Yes  Place? Yes   Time? Yes  Recall of three objects?  Yes  Can perform simple calculations? Yes  Displays appropriate judgment?Yes  Can read the correct time from a watch face?Yes   Advanced Directives have been discussed with the patient? No  List the Names of Other Physician/Practitioners you currently use: 1.    Indicate any recent Medical Services you may  have received from other than [facility] providers in the past year (date may be approximate).  Immunization History  Administered Date(s) Administered  . Influenza Split 08/03/2011, 09/13/2012  . Influenza Whole 08/12/2008, 08/12/2009, 08/19/2010  . Influenza, High Dose Seasonal PF 09/05/2013  . Influenza,inj,Quad PF,36+ Mos 08/18/2014  . Pneumococcal Conjugate-13 08/18/2014  . Pneumococcal Polysaccharide-23 09/10/2007  . Td 10/21/2008  . Zoster 02/04/2010    Screening Tests Health Maintenance  Topic Date Due  . OPHTHALMOLOGY EXAM  01/18/1951  . FOOT EXAM  07/30/2010  . HEMOGLOBIN A1C  02/17/2015  . INFLUENZA VACCINE  06/15/2015  . URINE MICROALBUMIN  08/19/2015  . MAMMOGRAM  08/21/2016  . TETANUS/TDAP  10/21/2018  . COLONOSCOPY  07/13/2020  . PNEUMOCOCCAL POLYSACCHARIDE VACCINE AGE 31 AND OVER  Completed  . ZOSTAVAX  Completed    All answers were reviewed with the patient and necessary referrals were made:  O'SULLIVAN,Chanele Douglas S., NP   09/17/2014   History reviewed: allergies, current medications, past family history, past medical history, past social history, past surgical history and problem list  Review of Systems Pertinent items are noted in HPI.    Objective:     Vision by Snellen chart: right FUX:NATFTDD declines measurement, left UKG:URKYHCW declines measurement  Body mass index is 40.77 kg/(m^2). BP  138/80 mmHg  Pulse 42  Temp(Src) 98 F (36.7 C) (Oral)  Resp 16  Ht 5\' 5"  (1.651 m)  Wt 245 lb (111.131 kg)  BMI 40.77 kg/m2  SpO2 95%  Physical Exam  Constitutional: She is oriented to person, place, and time. She appears well-developed and well-nourished. No distress.  HENT:  Head: Normocephalic and atraumatic.  Right Ear: Tympanic membrane and ear canal normal.  Left Ear: Tympanic membrane and ear canal normal.  Mouth/Throat: Oropharynx is clear and moist.  Eyes: Pupils are equal, round, and reactive to light. No scleral icterus.  Neck: Normal range of motion. No thyromegaly present.  Cardiovascular: Normal rate and regular rhythm.   No murmur heard. Pulmonary/Chest: Effort normal and breath sounds normal. No respiratory distress. He has no wheezes. She has no rales. She exhibits no tenderness.  Abdominal: Soft. Bowel sounds are normal. He exhibits no distension and no mass. There is no tenderness. There is no rebound and no guarding.  Musculoskeletal: She exhibits no edema.  Lymphadenopathy:    She has no cervical adenopathy.  Neurological: She is alert and oriented to person, place, and time. She has normal reflexes. She exhibits normal muscle tone. Coordination normal.  Skin: Skin is warm and dry.  Psychiatric: She has a normal mood and affect. Her behavior is normal. Judgment and thought content normal.  Breasts: Examined lying Right: smaller than left breast secondary to surgery. Without masses, retractions, discharge or axillary adenopathy.  Left: Without masses, retractions, discharge or axillary adenopathy.  Inguinal/mons: Normal without inguinal adenopathy  Pelvis: not performed      Assessment & Plan:        Assessment:          Plan:     During the course of the visit the patient was educated and counseled about appropriate screening and preventive services including:    Bone densitometry screening  Diet review for nutrition referral? Yes ____   Not Indicated ____   Patient Instructions (the written plan) was given to the patient.  Medicare Attestation I have personally reviewed: The patient's medical and social history Their use of alcohol, tobacco or illicit drugs Their current medications and supplements The patient's functional  ability including ADLs,fall risks, home safety risks, cognitive, and hearing and visual impairment Diet and physical activities Evidence for depression or mood disorders  The patient's weight, height, BMI, and visual acuity have been recorded in the chart.  I have made referrals, counseling, and provided education to the patient based on review of the above and I have provided the patient with a written personalized care plan for preventive services.     Nance Pear., NP   09/17/2014      Patient ID: KIMORA STANKOVIC, female   DOB: 23-Oct-1941, 73 y.o.   MRN: [id number]

## 2014-09-17 NOTE — Assessment & Plan Note (Addendum)
Has some joint pain on simvastatin. Will change to pravastatin. Pt is instructed to call if joint pain worsens or if it does not improve.

## 2014-09-17 NOTE — Telephone Encounter (Signed)
Received refill request from pharmacy for metoprolol 50mg  once a day. Upon review of EPIC, Provider thought pt was taking metoprolol 50mg  twice a day and she instructed her to take 25mg  twice a day. New rx was sent to pharmacy at today's visit. Per verbal from Provider, need to send new Rx for Toprol XL 25mg  once a day.  Spoke with pt and verified that she has been taking 50mg  once a day. Advised her new directions will be 25mg  once a day and rx has been sent. Pt voices understanding.

## 2014-09-17 NOTE — Assessment & Plan Note (Signed)
Stable on current meds.  Continue same. 

## 2014-09-26 ENCOUNTER — Encounter: Payer: Self-pay | Admitting: Family

## 2014-09-26 MED ORDER — SIMVASTATIN 40 MG PO TABS: 40.0000 mg | ORAL_TABLET | Freq: Every day | ORAL | Status: DC

## 2014-09-28 ENCOUNTER — Other Ambulatory Visit: Payer: Self-pay | Admitting: Internal Medicine

## 2014-09-29 ENCOUNTER — Telehealth: Payer: Self-pay

## 2014-09-29 NOTE — Telephone Encounter (Signed)
Called pt to get eye exam information.   Will call pt back, per husband she is not in right now.

## 2014-10-01 ENCOUNTER — Telehealth: Payer: Self-pay | Admitting: Family

## 2014-10-01 MED ORDER — OXYBUTYNIN CHLORIDE 5 MG PO TABS: ORAL_TABLET | ORAL | Status: DC

## 2014-10-01 MED ORDER — HYDROCHLOROTHIAZIDE 25 MG PO TABS: ORAL_TABLET | ORAL | Status: DC

## 2014-10-01 NOTE — Telephone Encounter (Signed)
Refills sent, notified pt. 

## 2014-10-01 NOTE — Telephone Encounter (Signed)
Caller name: Bernestine Relation to pt: self Call back number: Pharmacy: [facility] on [address]  Reason for call:   Patient requesting a refill of oxybutynin (DITROPAN) 5 MG tablet [[id number]] and hydrochlorizide

## 2014-10-07 ENCOUNTER — Telehealth: Payer: Self-pay | Admitting: Family

## 2014-10-07 MED ORDER — PIOGLITAZONE HCL 30 MG PO TABS: 30.0000 mg | ORAL_TABLET | Freq: Every day | ORAL | Status: DC

## 2014-10-07 NOTE — Telephone Encounter (Signed)
Refill sent, notified pt. 

## 2014-10-07 NOTE — Telephone Encounter (Signed)
°  Caller name: Mayeli, Bornhorst Relation to pt: self  Call back number: Pharmacy: [facility] [phone]  Reason for call:   Pt requesting a refill of pioglitazone (ACTOS) 30 MG tablet

## 2014-10-14 ENCOUNTER — Telehealth: Payer: Self-pay | Admitting: Family

## 2014-10-14 DIAGNOSIS — R7611 Nonspecific reaction to tuberculin skin test without active tuberculosis: Secondary | ICD-10-CM | POA: Insufficient documentation

## 2014-10-14 NOTE — Telephone Encounter (Signed)
Yes, ordered placed. Can she please bring [place] a copy of her + PPD result? Have they referred her to the health department?  If not let me know and I will arrange.

## 2014-10-14 NOTE — Telephone Encounter (Signed)
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 Can she come in for that? Please advise

## 2014-10-14 NOTE — Telephone Encounter (Signed)
Notified pt and she voices understanding. Pt has not been referred to health dept and is agreeable to referral. She will bring copy of PPD result when she comes for her xray. Please placed referral to health dept.

## 2014-10-15 NOTE — Telephone Encounter (Signed)
Kelly Bell, patient is PPD positive and needs to be referred to health department. Could you please arrange?  There is not a build in epic for health department.

## 2014-10-16 ENCOUNTER — Ambulatory Visit (INDEPENDENT_AMBULATORY_CARE_PROVIDER_SITE_OTHER)
Admission: RE | Admit: 2014-10-16 | Discharge: 2014-10-16 | Disposition: A | Discharge status: Home / Self Care | Payer: Medicare Other | Source: Ambulatory Visit | Attending: Family | Admitting: Family

## 2014-10-16 ENCOUNTER — Encounter: Payer: Self-pay | Admitting: Family

## 2014-10-16 ENCOUNTER — Telehealth: Payer: Self-pay | Admitting: Family

## 2014-10-16 DIAGNOSIS — R7611 Nonspecific reaction to tuberculin skin test without active tuberculosis: Secondary | ICD-10-CM

## 2014-10-16 DIAGNOSIS — Z13820 Encounter for screening for osteoporosis: Secondary | ICD-10-CM

## 2014-10-16 DIAGNOSIS — M8580 Other specified disorders of bone density and structure, unspecified site: Secondary | ICD-10-CM | POA: Insufficient documentation

## 2014-10-16 NOTE — Telephone Encounter (Signed)
[facility] Department, will need copies of results of skin test before referral can be done.

## 2014-10-16 NOTE — Telephone Encounter (Signed)
CXR negative, bone density shows mild bone thinning. Add caltrate + D 600mg  bid. Make sure to get regular weight bearing exercise such as walking.

## 2014-10-17 NOTE — Telephone Encounter (Signed)
Spoke with pt. States her cxr was performed in [place] when she completed her DEXA so she did not bring result over to [place]. Pt states she will either brop it off on Monday or have her employer fax it to [place] on Monday. Advised pt we need to have that before we can make referral to health dept.

## 2014-10-17 NOTE — Telephone Encounter (Signed)
Notified pt and she voices understanding. 

## 2014-10-17 NOTE — Telephone Encounter (Signed)
Did she bring this?  If not please call her to remind her. Thanks.

## 2014-10-18 ENCOUNTER — Encounter: Payer: Self-pay | Admitting: Family

## 2014-10-24 NOTE — Telephone Encounter (Signed)
Left message for pt to return my call.

## 2014-10-24 NOTE — Telephone Encounter (Signed)
Kelly Bell-- do you remember seeing TB skin result come through on pt?

## 2014-10-24 NOTE — Telephone Encounter (Signed)
We have not recevied. Thanks.

## 2014-10-27 NOTE — Telephone Encounter (Signed)
Spoke with pt. She states she is no longer working. Also states that she has a copy of TB skin test result but her husband has been in ICU for the last 6 days and she has not been able to bring copy to [place]. States she will get copy to [place] but cannot say when that will be.

## 2014-10-28 ENCOUNTER — Other Ambulatory Visit: Payer: Self-pay | Admitting: Internal Medicine

## 2014-10-28 NOTE — Telephone Encounter (Signed)
Delsa Sale, are you able to close this message- it says, "incomplete noted jen sebastian" when I try.

## 2014-11-14 HISTORY — PX: MASTECTOMY: SHX3

## 2014-12-02 ENCOUNTER — Telehealth: Payer: Self-pay | Admitting: Family

## 2014-12-02 NOTE — Telephone Encounter (Signed)
PT requesting list of current RX info faxed to [facility]- [phone]-

## 2014-12-03 NOTE — Telephone Encounter (Signed)
Attempted to reach pt to verify specific meds to send to mail order and left message to return my call.

## 2014-12-04 MED ORDER — METOPROLOL SUCCINATE ER 25 MG PO TB24: 25.0000 mg | ORAL_TABLET | Freq: Every day | ORAL | Status: DC

## 2014-12-04 NOTE — Telephone Encounter (Addendum)
Received fax from [facility] requesting refill of metoprolol. Refill sent. Pt will be due for follow up on or after 12/18/14.  Attempted to reach pt to verify if refills are needed on other medications and left message for pt to return my call.

## 2014-12-04 NOTE — Telephone Encounter (Signed)
Left message with pt's husband to have pt return my call. 

## 2014-12-04 NOTE — Telephone Encounter (Signed)
Patient returned phone  °

## 2014-12-08 MED ORDER — FLUTICASONE PROPIONATE 50 MCG/ACT NA SUSP: NASAL | Status: DC

## 2014-12-08 MED ORDER — GLIMEPIRIDE 4 MG PO TABS: 4.0000 mg | ORAL_TABLET | Freq: Every day | ORAL | Status: DC

## 2014-12-08 MED ORDER — PIOGLITAZONE HCL 30 MG PO TABS: 30.0000 mg | ORAL_TABLET | Freq: Every day | ORAL | Status: DC

## 2014-12-08 MED ORDER — SIMVASTATIN 40 MG PO TABS: 40.0000 mg | ORAL_TABLET | Freq: Every day | ORAL | Status: DC

## 2014-12-08 MED ORDER — NABUMETONE 500 MG PO TABS: ORAL_TABLET | ORAL | Status: DC

## 2014-12-08 MED ORDER — OXYBUTYNIN CHLORIDE 5 MG PO TABS: ORAL_TABLET | ORAL | Status: DC

## 2014-12-08 MED ORDER — HYDROCHLOROTHIAZIDE 25 MG PO TABS: ORAL_TABLET | ORAL | Status: DC

## 2014-12-08 NOTE — Telephone Encounter (Signed)
Patient states that she needs all prescriptions filled to [facility]. Appointment scheduled

## 2014-12-08 NOTE — Addendum Note (Signed)
Addended by: Kelle Darting A on: 12/08/2014 12:19 PM   Modules accepted: Orders

## 2014-12-08 NOTE — Telephone Encounter (Signed)
Refills sent for all prescribed Rxs.

## 2014-12-17 ENCOUNTER — Ambulatory Visit: Payer: Self-pay | Admitting: Family

## 2014-12-21 ENCOUNTER — Other Ambulatory Visit: Payer: Self-pay | Admitting: Family

## 2014-12-22 NOTE — Telephone Encounter (Signed)
Medication Detail      Disp Refills Start End     metoprolol succinate (TOPROL XL) 25 MG 24 hr tablet 90 tablet 0 12/04/2014     Sig - Route: Take 1 tablet (25 mg total) by mouth daily. - Oral    E-Prescribing Status: Receipt confirmed by pharmacy (12/04/2014 8:59 AM EST)     Pharmacy    [place], [place]   Rx request Denied - [place] for request/SLS

## 2014-12-23 ENCOUNTER — Encounter: Payer: Self-pay | Admitting: Family

## 2014-12-23 ENCOUNTER — Ambulatory Visit (INDEPENDENT_AMBULATORY_CARE_PROVIDER_SITE_OTHER): Payer: Medicare Other | Admitting: Family

## 2014-12-23 ENCOUNTER — Ambulatory Visit: Payer: Self-pay | Admitting: Family

## 2014-12-23 VITALS — BP 140/88 | HR 66 | Temp 97.8°F | Resp 16 | Ht 60.0 in | Wt 242.0 lb

## 2014-12-23 DIAGNOSIS — E119 Type 2 diabetes mellitus without complications: Secondary | ICD-10-CM

## 2014-12-23 DIAGNOSIS — I10 Essential (primary) hypertension: Secondary | ICD-10-CM

## 2014-12-23 DIAGNOSIS — E785 Hyperlipidemia, unspecified: Secondary | ICD-10-CM

## 2014-12-23 LAB — BASIC METABOLIC PANEL
BUN: 11 mg/dL (ref 6–23)
CO2: 28 mEq/L (ref 19–32)
CREATININE: 0.76 mg/dL (ref 0.40–1.20)
Calcium: 9.7 mg/dL (ref 8.4–10.5)
Chloride: 106 mEq/L (ref 96–112)
GFR: 95.43 mL/min (ref >60.00)
GLUCOSE: 116 mg/dL — AB (ref 70–99)
POTASSIUM: 4 meq/L (ref 3.5–5.1)
SODIUM: 137 meq/L (ref 135–145)

## 2014-12-23 LAB — HEMOGLOBIN A1C: HEMOGLOBIN A1C: 6.1 % (ref 4.6–6.5)

## 2014-12-23 NOTE — Progress Notes (Signed)
Pre visit review using our clinic review tool, if applicable. No additional management support is needed unless otherwise documented below in the visit note. 

## 2014-12-23 NOTE — Assessment & Plan Note (Addendum)
Well controlled on current meds. Continue same.  Obtain a1c.

## 2014-12-23 NOTE — Assessment & Plan Note (Signed)
Stable on current meds.  Continue same. 

## 2014-12-23 NOTE — Progress Notes (Signed)
Subjective:    Patient ID: Kelly Bell, female    DOB: January 27, 1941, 74 y.o.   MRN: [id number]  HPI Kelly Bell is here today for follow up.  1. HYPERTENSION: Reports compliance with HCTZ and metoprolol. Denies chest pain, dyspnea. Reports some edema on the top of her feet when she is on her feet a lot. The HCTZ reduces edema. BP Readings from Last 3 Encounters:  12/23/14 140/88  09/17/14 138/80  08/18/14 120/78    2. Hyperlipidemia: Reports compliance with simvastatin. Denies myalgia. Lab Results  Component Value Date   CHOL 111 04/17/2014   HDL 34* 04/17/2014   LDLCALC 58 04/17/2014   LDLDIRECT 111.5 01/01/2007   TRIG 95 04/17/2014   CHOLHDL 3.3 04/17/2014    3. DM2: Fasting BS: 90-110 Diet: reports fairly good diet but she does have sweets at times. Exercise: Used to go to [facility] but husband has been sick- she plans to start back. Eye exam: 05/2014. Reports compliance with pioglitazone and glimepiride. Denies episodes of hypoglycemia. Lab Results  Component Value Date   HGBA1C 5.8 08/18/2014   HGBA1C 5.8* 04/17/2014   HGBA1C 8.0* 09/05/2013   Lab Results  Component Value Date   MICROALBUR 0.3 08/18/2014   LDLCALC 58 04/17/2014   CREATININE 0.8 08/18/2014   4. Reports feeling agitated with husband because he has been sick and not always doing what he should for his health. Would like to have something for "nerves". Denies depression.   Review of Systems  Respiratory: Negative for shortness of breath.   Cardiovascular: Positive for leg swelling. Negative for chest pain.   Past Medical History  Diagnosis Date  . Breast cancer     Right  . Hyperlipidemia   . HTN (hypertension)   . Type II or unspecified type diabetes mellitus without mention of complication, not stated as uncontrolled   . Irritable bladder   . Knee pain     Bilateral    History   Social History  . Marital Status: Married    Spouse Name: N/A    Number of Children: 2  . Years of  Education: 12   Occupational History  . 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 Smoking status: Never Smoker   . Smokeless tobacco: Never Used  . Alcohol Use: No  . Drug Use: No  . Sexual Activity: Not Currently   Other Topics Concern  . Not on file   Social History Narrative   HSG. Married 1965. 1 son - '68, 1 daughter-'72; 3 grandsons. 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                Past Surgical History  Procedure Laterality Date  . Mastectomy partial / lumpectomy w/ axillary lymphadenectomy    . Abdominal hysterectomy  1975  . Shoulder arthroscopy  2000  . Colonoscopy  05/10/05    Family History  Problem Relation Age of Onset  . Diabetes Mother 73    Deceased  . Pancreatic cancer Mother   . Diabetes Father 81    Deceased  . Heart failure Father   . Heart disease Father     CHF  . Breast cancer Sister     x2  . Cancer Brother     oral cancer  . Corneal Dystrophy Son     bilateral  . Healthy Daughter     x1  No Known Allergies  Current Outpatient Prescriptions on File Prior to Visit  Medication Sig Dispense Refill  . aspirin 325 MG tablet Take 325 mg by mouth daily.      . Calcium Carbonate-Vitamin D (CALTRATE 600+D) 600-400 MG-UNIT per tablet Take 1 tablet by mouth 2 (two) times daily.    . fluticasone (FLONASE) 50 MCG/ACT nasal spray USE TWO SPRAY IN EACH NOSTRIL ONCE DAILY 48 g 1  . glimepiride (AMARYL) 4 MG tablet Take 1 tablet (4 mg total) by mouth daily. 90 tablet 1  . hydrochlorothiazide (HYDRODIURIL) 25 MG tablet TAKE 1/2 TABLET BY MOUTH EVERY DAY 45 tablet 1  . metoprolol succinate (TOPROL XL) 25 MG 24 hr tablet Take 1 tablet (25 mg total) by mouth daily. 90 tablet 0  . nabumetone (RELAFEN) 500 MG tablet TAKE ONE TABLET BY MOUTH TWICE DAILY as needed AFTER A MEAL FOR ARTHRITIS 180 tablet 1  . oxybutynin (DITROPAN) 5 MG tablet TAKE ONE TABLET BY MOUTH 4 TIMES DAILY  AS NEEDED 360 tablet 1  . pioglitazone (ACTOS) 30 MG tablet Take 1 tablet (30 mg total) by mouth daily. 90 tablet 1  . simvastatin (ZOCOR) 40 MG tablet Take 1 tablet (40 mg total) by mouth at bedtime. 90 tablet 1  . [DISCONTINUED] oxybutynin (DITROPAN) 5 MG tablet TAKE ONE TABLET BY MOUTH 4 TIMES DAILY AS NEEDED 120 tablet 5   No current facility-administered medications on file prior to visit.    BP 140/88 mmHg  Pulse 66  Temp(Src) 97.8 F (36.6 C) (Oral)  Resp 16  Ht 5' (1.524 m)  Wt 242 lb (109.77 kg)  BMI 47.26 kg/m2  SpO2 99%        Objective:   Physical Exam  Constitutional: She is oriented to person, place, and time. She appears well-developed and well-nourished.  Cardiovascular: Normal rate, regular rhythm and normal heart sounds.   No murmur heard. Pulmonary/Chest: Effort normal and breath sounds normal. No respiratory distress. She has no wheezes.  Musculoskeletal: She exhibits no edema.  Neurological: She is alert and oriented to person, place, and time.  Psychiatric: She has a normal mood and affect. Her behavior is normal. Judgment and thought content normal.          Assessment & Plan:  We did discuss her stress at home.  I don't think that she truly has anxiety or depression, but instead is dealing with stress.  Advised pt to try to add exercise for stress reduction and consider referral to therapist (she declines at this time).

## 2014-12-23 NOTE — Patient Instructions (Addendum)
Please complete lab work prior to leaving. Follow up in 3 months.  

## 2015-03-07 ENCOUNTER — Other Ambulatory Visit: Payer: Self-pay | Admitting: Family

## 2015-03-09 NOTE — Telephone Encounter (Signed)
Refill sent per [facility] refill protocol/SLS  

## 2015-03-24 ENCOUNTER — Other Ambulatory Visit: Payer: Self-pay | Admitting: Family

## 2015-03-24 NOTE — Telephone Encounter (Signed)
Refills sent to [facility] for: amaryl, metoprolol and actos.  Pt is due for 3 month follow up this month.  Please call pt to arrange appt.

## 2015-03-25 NOTE — Telephone Encounter (Signed)
Informed patient of med refill and appointment scheduled for 04/06/15

## 2015-04-06 ENCOUNTER — Ambulatory Visit (INDEPENDENT_AMBULATORY_CARE_PROVIDER_SITE_OTHER): Payer: Medicare Other | Admitting: Family

## 2015-04-06 ENCOUNTER — Encounter: Payer: Self-pay | Admitting: Family

## 2015-04-06 VITALS — BP 148/84 | HR 54 | Temp 98.3°F | Resp 18 | Ht 60.0 in | Wt 249.6 lb

## 2015-04-06 DIAGNOSIS — N3281 Overactive bladder: Secondary | ICD-10-CM | POA: Insufficient documentation

## 2015-04-06 DIAGNOSIS — I10 Essential (primary) hypertension: Secondary | ICD-10-CM

## 2015-04-06 DIAGNOSIS — E119 Type 2 diabetes mellitus without complications: Secondary | ICD-10-CM | POA: Diagnosis not present

## 2015-04-06 DIAGNOSIS — E785 Hyperlipidemia, unspecified: Secondary | ICD-10-CM

## 2015-04-06 DIAGNOSIS — M791 Myalgia, unspecified site: Secondary | ICD-10-CM

## 2015-04-06 LAB — CK: Total CK: 131 U/L (ref 7–177)

## 2015-04-06 LAB — HEMOGLOBIN A1C: HEMOGLOBIN A1C: 5.6 % (ref 4.6–6.5)

## 2015-04-06 LAB — LIPID PANEL
CHOLESTEROL: 141 mg/dL (ref 0–200)
HDL: 38.1 mg/dL — ABNORMAL LOW (ref >39.00)
LDL Cholesterol: 75 mg/dL (ref 0–99)
NonHDL: 102.9
Total CHOL/HDL Ratio: 4
Triglycerides: 141 mg/dL (ref 0.0–149.0)
VLDL: 28.2 mg/dL (ref 0.0–40.0)

## 2015-04-06 LAB — BASIC METABOLIC PANEL
BUN: 15 mg/dL (ref 6–23)
CHLORIDE: 106 meq/L (ref 96–112)
CO2: 26 meq/L (ref 19–32)
CREATININE: 0.68 mg/dL (ref 0.40–1.20)
Calcium: 9.6 mg/dL (ref 8.4–10.5)
GFR: 108.42 mL/min (ref >60.00)
Glucose, Bld: 93 mg/dL (ref 70–99)
Potassium: 4 mEq/L (ref 3.5–5.1)
SODIUM: 138 meq/L (ref 135–145)

## 2015-04-06 MED ORDER — PRAVASTATIN SODIUM 80 MG PO TABS: 80.0000 mg | ORAL_TABLET | Freq: Every day | ORAL | Status: DC

## 2015-04-06 MED ORDER — LISINOPRIL 10 MG PO TABS: 10.0000 mg | ORAL_TABLET | Freq: Every day | ORAL | Status: DC

## 2015-04-06 MED ORDER — ACCU-CHEK AVIVA PLUS VI STRP: ORAL_STRIP | Status: DC

## 2015-04-06 NOTE — Progress Notes (Signed)
Pre visit review using our clinic review tool, if applicable. No additional management support is needed unless otherwise documented below in the visit note. 

## 2015-04-06 NOTE — Patient Instructions (Addendum)
Stop simvastatin, start pravastatin.  Add lisinopril for blood pressure. Please schedule a follow up appointment in 1 month.

## 2015-04-06 NOTE — Assessment & Plan Note (Signed)
Uncontrolled, add ace, follow up in 1 month for BP check and bmet.

## 2015-04-06 NOTE — Assessment & Plan Note (Signed)
Stop simvastatin, start pravastatin. Due to myalgia/arthrlagia. Obtain flp, ck

## 2015-04-06 NOTE — Assessment & Plan Note (Signed)
Uncontrolled despite ditropan, declines urology referral at this time. Monitor.

## 2015-04-06 NOTE — Progress Notes (Signed)
Subjective:    Patient ID: GARLENE APPERSON, female    DOB: Aug 17, 1941, 74 y.o.   MRN: [id number]  HPI  Ms. Butikofer is a 74 yr old female who presents today for follow up.   Patient presents today for follow up of multiple medical problems.  Diabetes Type 2  Pt is currently maintained on the following medications for diabetes: amaryl, actos  Lab Results  Component Value Date   HGBA1C 6.1 12/23/2014   HGBA1C 5.8 08/18/2014   HGBA1C 5.8* 04/17/2014    Lab Results  Component Value Date   MICROALBUR 0.3 08/18/2014   LDLCALC 58 04/17/2014   CREATININE 0.76 12/23/2014    Last diabetic eye exam was : 06/06/14 Denies polydipsia. Denies hypoglycemia Home glucose readings range: usually <100.  Max was 142 post prandial  Hyperlipidemia  Patient is currently maintained on the following medication for hyperlipidemia: zocor Last lipid panel as follows:  Lab Results  Component Value Date   CHOL 111 04/17/2014   HDL 34* 04/17/2014   LDLCALC 58 04/17/2014   LDLDIRECT 111.5 01/01/2007   TRIG 95 04/17/2014   CHOLHDL 3.3 04/17/2014   Pt reports some myalgia/arthralagia Patient reports good compliance with low fat/low cholesterol diet.   Hypertension  Patient is currently maintained on the following medications for blood pressure: toprol xl, hctz Patient reports good compliance with blood pressure medications. Patient denies chest pain, shortness of breath or swelling. Last 3 blood pressure readings in our office are as follows: BP Readings from Last 3 Encounters:  04/06/15 148/84  12/23/14 140/88  09/17/14 138/80   Overactive Bladder- Reports that she continues to have difficulty holding her urine. Reports no improvement despite ditropan. Declines urology referral.  Denies dysuria or frequency.   Review of Systems See HPI  Past Medical History  Diagnosis Date  . Breast cancer     Right  . Hyperlipidemia   . HTN (hypertension)   . Type II or unspecified type diabetes  mellitus without mention of complication, not stated as uncontrolled   . Irritable bladder   . Knee pain     Bilateral    History   Social History  . Marital Status: Married    Spouse Name: N/A  . Number of Children: 2  . Years of Education: 12   Occupational History  . 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 Smoking status: Never Smoker   . Smokeless tobacco: Never Used  . Alcohol Use: No  . Drug Use: No  . Sexual Activity: Not Currently   Other Topics Concern  . Not on file   Social History Narrative   HSG. Married 1965. 1 son - '68, 1 daughter-'72; 3 grandsons. 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                Past Surgical History  Procedure Laterality Date  . Mastectomy partial / lumpectomy w/ axillary lymphadenectomy    . Abdominal hysterectomy  1975  . Shoulder arthroscopy  2000  . Colonoscopy  05/10/05    Family History  Problem Relation Age of Onset  . Diabetes Mother 58    Deceased  . Pancreatic cancer Mother   . Diabetes Father 49    Deceased  . Heart failure Father   . Heart disease Father     CHF  . Breast cancer Sister     x2  . Cancer Brother  oral cancer  . Corneal Dystrophy Son     bilateral  . Healthy Daughter     x1    No Known Allergies  Current Outpatient Prescriptions on File Prior to Visit  Medication Sig Dispense Refill  . aspirin 325 MG tablet Take 325 mg by mouth daily.      . Calcium Carbonate-Vitamin D (CALTRATE 600+D) 600-400 MG-UNIT per tablet Take 1 tablet by mouth 2 (two) times daily.    . fluticasone (FLONASE) 50 MCG/ACT nasal spray USE TWO SPRAY IN EACH NOSTRIL ONCE DAILY 48 g 1  . glimepiride (AMARYL) 4 MG tablet Take 1 tablet by mouth  daily 90 tablet 0  . hydrochlorothiazide (HYDRODIURIL) 25 MG tablet TAKE 1/2 TABLET BY MOUTH EVERY DAY 45 tablet 1  . metoprolol succinate (TOPROL-XL) 25 MG 24 hr tablet Take 1 tablet by mouth  daily  90 tablet 0  . nabumetone (RELAFEN) 500 MG tablet TAKE ONE TABLET BY MOUTH TWICE DAILY as needed AFTER A MEAL FOR ARTHRITIS 180 tablet 1  . oxybutynin (DITROPAN) 5 MG tablet TAKE ONE TABLET BY MOUTH 4 TIMES DAILY AS NEEDED 360 tablet 1  . pioglitazone (ACTOS) 30 MG tablet Take 1 tablet by mouth  daily 90 tablet 0  . simvastatin (ZOCOR) 40 MG tablet Take 1 tablet (40 mg total) by mouth at bedtime. 90 tablet 1  . [DISCONTINUED] oxybutynin (DITROPAN) 5 MG tablet TAKE ONE TABLET BY MOUTH 4 TIMES DAILY AS NEEDED 120 tablet 5   No current facility-administered medications on file prior to visit.    BP 148/84 mmHg  Pulse 54  Temp(Src) 98.3 F (36.8 C) (Oral)  Resp 18  Ht 5' (1.524 m)  Wt 249 lb 9.6 oz (113.218 kg)  BMI 48.75 kg/m2  SpO2 99%       Objective:   Physical Exam  Constitutional: She appears well-developed and well-nourished.  Cardiovascular: Normal rate, regular rhythm and normal heart sounds.   No murmur heard. 1+ bilateral LE edema  Pulmonary/Chest: Effort normal and breath sounds normal. No respiratory distress. She has no wheezes.  Psychiatric: She has a normal mood and affect. Her behavior is normal. Judgment and thought content normal.          Assessment & Plan:

## 2015-04-06 NOTE — Assessment & Plan Note (Signed)
Stable, obtain a1c.

## 2015-04-08 ENCOUNTER — Telehealth: Payer: Self-pay | Admitting: Family

## 2015-04-08 NOTE — Telephone Encounter (Signed)
Notified pt and she voices understanding. 

## 2015-04-08 NOTE — Telephone Encounter (Signed)
Sugar very well controlled. I would like to decrease her amaryl from 4mg  to (1/2 tab) 2mg  once daily. Cholesterol looks good except HDL should be higher (good cholesterol) regular exercise can help raise this.

## 2015-04-16 ENCOUNTER — Encounter: Payer: Self-pay | Admitting: Family

## 2015-04-28 ENCOUNTER — Other Ambulatory Visit: Payer: Self-pay | Admitting: Family

## 2015-05-07 ENCOUNTER — Encounter: Payer: Self-pay | Admitting: Family

## 2015-05-07 ENCOUNTER — Ambulatory Visit (INDEPENDENT_AMBULATORY_CARE_PROVIDER_SITE_OTHER): Payer: Medicare Other | Admitting: Family

## 2015-05-07 VITALS — BP 134/84 | HR 55 | Temp 98.1°F | Resp 16 | Ht 60.0 in | Wt 249.0 lb

## 2015-05-07 DIAGNOSIS — E785 Hyperlipidemia, unspecified: Secondary | ICD-10-CM | POA: Diagnosis not present

## 2015-05-07 DIAGNOSIS — I10 Essential (primary) hypertension: Secondary | ICD-10-CM | POA: Diagnosis not present

## 2015-05-07 DIAGNOSIS — M791 Myalgia, unspecified site: Secondary | ICD-10-CM

## 2015-05-07 DIAGNOSIS — M722 Plantar fascial fibromatosis: Secondary | ICD-10-CM | POA: Insufficient documentation

## 2015-05-07 LAB — BASIC METABOLIC PANEL
BUN: 13 mg/dL (ref 6–23)
CALCIUM: 9.8 mg/dL (ref 8.4–10.5)
CO2: 28 mEq/L (ref 19–32)
Chloride: 107 mEq/L (ref 96–112)
Creatinine, Ser: 0.82 mg/dL (ref 0.40–1.20)
GFR: 87.33 mL/min (ref >60.00)
GLUCOSE: 110 mg/dL — AB (ref 70–99)
Potassium: 4.1 mEq/L (ref 3.5–5.1)
SODIUM: 139 meq/L (ref 135–145)

## 2015-05-07 LAB — CK: Total CK: 124 U/L (ref 7–177)

## 2015-05-07 NOTE — Assessment & Plan Note (Signed)
BP is improved.  Continue lisinopril. Obtain bmet.

## 2015-05-07 NOTE — Progress Notes (Signed)
Subjective:    Patient ID: Kelly Bell, female    DOB: 27-Sep-1941, 74 y.o.   MRN: [id number]  HPI  Kelly Bell is a 74 yr old female who presents today for follow up.  HTN-  Last visit BP was elevated. ACE was added to her regimen.  Denies SE. Denies cough.  BP Readings from Last 3 Encounters:  05/07/15 134/84  04/06/15 148/84  12/23/14 140/88   Hyperlipidemia- last visit simvastatin was d/c'd due to arthralgia and she was given rx for pravastatin. Reports that pravastatin caused worse myalgia. She returned to simvastatin.      Review of Systems See HPI  Past Medical History  Diagnosis Date  . Breast cancer     Right  . Hyperlipidemia   . HTN (hypertension)   . Type II or unspecified type diabetes mellitus without mention of complication, not stated as uncontrolled   . Irritable bladder   . Knee pain     Bilateral    History   Social History  . Marital Status: Married    Spouse Name: N/A  . Number of Children: 2  . Years of Education: 12   Occupational History  . 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 Smoking status: Never Smoker   . Smokeless tobacco: Never Used  . Alcohol Use: No  . Drug Use: No  . Sexual Activity: Not Currently   Other Topics Concern  . Not on file   Social History Narrative   HSG. Married 1965. 1 son - '68, 1 daughter-'72; 3 grandsons. 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                Past Surgical History  Procedure Laterality Date  . Mastectomy partial / lumpectomy w/ axillary lymphadenectomy    . Abdominal hysterectomy  1975  . Shoulder arthroscopy  2000  . Colonoscopy  05/10/05    Family History  Problem Relation Age of Onset  . Diabetes Mother 72    Deceased  . Pancreatic cancer Mother   . Diabetes Father 64    Deceased  . Heart failure Father   . Heart disease Father     CHF  . Breast cancer Sister     x2  . Cancer Brother    oral cancer  . Corneal Dystrophy Son     bilateral  . Healthy Daughter     x1    Allergies  Allergen Reactions  . Pravastatin Other (See Comments)    Severe muscle cramps.    Current Outpatient Prescriptions on File Prior to Visit  Medication Sig Dispense Refill  . ACCU-CHEK AVIVA PLUS test strip Use to check blood sugar once a day. 100 each 1  . aspirin 325 MG tablet Take 325 mg by mouth daily.      . Calcium Carbonate-Vitamin D (CALTRATE 600+D) 600-400 MG-UNIT per tablet Take 1 tablet by mouth 2 (two) times daily.    . fluticasone (FLONASE) 50 MCG/ACT nasal spray USE TWO SPRAY IN EACH NOSTRIL ONCE DAILY 48 g 1  . glimepiride (AMARYL) 4 MG tablet Take 1 tablet by mouth  daily (Patient taking differently: take 1/2 tab by mouth once daily.) 90 tablet 0  . hydrochlorothiazide (HYDRODIURIL) 25 MG tablet TAKE 1/2 TABLET BY MOUTH EVERY DAY 45 tablet 1  . lisinopril (PRINIVIL,ZESTRIL) 10 MG tablet Take 1 tablet (10 mg total) by mouth daily. [place]  tablet 2  . metoprolol succinate (TOPROL-XL) 25 MG 24 hr tablet Take 1 tablet by mouth  daily 90 tablet 0  . nabumetone (RELAFEN) 500 MG tablet TAKE ONE TABLET BY MOUTH TWICE DAILY as needed AFTER A MEAL FOR ARTHRITIS 180 tablet 1  . oxybutynin (DITROPAN) 5 MG tablet TAKE ONE TABLET BY MOUTH 4 TIMES DAILY AS NEEDED 360 tablet 1  . pioglitazone (ACTOS) 30 MG tablet Take 1 tablet by mouth  daily 90 tablet 0  . simvastatin (ZOCOR) 40 MG tablet Take 1 tablet (40 mg total) by mouth at bedtime. 90 tablet 1  . [DISCONTINUED] oxybutynin (DITROPAN) 5 MG tablet TAKE ONE TABLET BY MOUTH 4 TIMES DAILY AS NEEDED 120 tablet 5   No current facility-administered medications on file prior to visit.    BP 134/84 mmHg  Pulse 55  Temp(Src) 98.1 F (36.7 C) (Oral)  Resp 16  Ht 5' (1.524 m)  Wt 249 lb (112.946 kg)  BMI 48.63 kg/m2  SpO2 96%       Objective:   Physical Exam  Constitutional: She appears well-developed and well-nourished.  Cardiovascular:  Normal rate, regular rhythm and normal heart sounds.   No murmur heard. Pulmonary/Chest: Effort normal and breath sounds normal. No respiratory distress. She has no wheezes.  Psychiatric: She has a normal mood and affect. Her behavior is normal. Judgment and thought content normal.          Assessment & Plan:

## 2015-05-07 NOTE — Assessment & Plan Note (Signed)
Mild myalgia- pt is back on simvastatin. Advised pt to try cutting dose in half and call if myalgia worsens or does not improve. Will obtian CK level as well.

## 2015-05-07 NOTE — Patient Instructions (Addendum)
Please complete lab work prior to leaving. Cut simvastatin in half and take 1/2 tab by mouth once daily. If muscle pain worsens or does not improve, please stop simvastatin and call me.  Follow up in 3 months.

## 2015-05-07 NOTE — Progress Notes (Signed)
Pre visit review using our clinic review tool, if applicable. No additional management support is needed unless otherwise documented below in the visit note. 

## 2015-06-03 ENCOUNTER — Encounter: Payer: Self-pay | Admitting: Internal Medicine

## 2015-06-12 ENCOUNTER — Other Ambulatory Visit: Payer: Self-pay | Admitting: Family

## 2015-06-14 ENCOUNTER — Other Ambulatory Visit: Payer: Self-pay | Admitting: Family

## 2015-07-20 ENCOUNTER — Other Ambulatory Visit: Payer: Self-pay | Admitting: Family

## 2015-07-21 ENCOUNTER — Other Ambulatory Visit: Payer: Self-pay | Admitting: Family

## 2015-08-07 ENCOUNTER — Ambulatory Visit (INDEPENDENT_AMBULATORY_CARE_PROVIDER_SITE_OTHER): Payer: Medicare Other | Admitting: Family

## 2015-08-07 ENCOUNTER — Encounter: Payer: Self-pay | Admitting: Family

## 2015-08-07 VITALS — BP 150/86 | HR 68 | Temp 98.0°F | Resp 16 | Ht 60.0 in | Wt 251.6 lb

## 2015-08-07 DIAGNOSIS — E119 Type 2 diabetes mellitus without complications: Secondary | ICD-10-CM

## 2015-08-07 DIAGNOSIS — E785 Hyperlipidemia, unspecified: Secondary | ICD-10-CM | POA: Diagnosis not present

## 2015-08-07 DIAGNOSIS — Z23 Encounter for immunization: Secondary | ICD-10-CM

## 2015-08-07 DIAGNOSIS — I10 Essential (primary) hypertension: Secondary | ICD-10-CM

## 2015-08-07 DIAGNOSIS — R7611 Nonspecific reaction to tuberculin skin test without active tuberculosis: Secondary | ICD-10-CM | POA: Diagnosis not present

## 2015-08-07 LAB — BASIC METABOLIC PANEL
BUN: 13 mg/dL (ref 6–23)
CO2: 29 mEq/L (ref 19–32)
Calcium: 9.7 mg/dL (ref 8.4–10.5)
Chloride: 107 mEq/L (ref 96–112)
Creatinine, Ser: 0.81 mg/dL (ref 0.40–1.20)
GFR: 88.52 mL/min (ref >60.00)
GLUCOSE: 107 mg/dL — AB (ref 70–99)
POTASSIUM: 4.2 meq/L (ref 3.5–5.1)
SODIUM: 141 meq/L (ref 135–145)

## 2015-08-07 LAB — MICROALBUMIN / CREATININE URINE RATIO
Creatinine,U: 64.2 mg/dL
Microalb Creat Ratio: 1.1 mg/g (ref 0.0–30.0)
Microalb, Ur: 0.7 mg/dL (ref 0.0–1.9)

## 2015-08-07 LAB — LIPID PANEL
CHOL/HDL RATIO: 5
Cholesterol: 198 mg/dL (ref 0–200)
HDL: 36.6 mg/dL — AB (ref >39.00)
LDL Cholesterol: 127 mg/dL — ABNORMAL HIGH (ref 0–99)
NonHDL: 161.15
Triglycerides: 169 mg/dL — ABNORMAL HIGH (ref 0.0–149.0)
VLDL: 33.8 mg/dL (ref 0.0–40.0)

## 2015-08-07 LAB — HEMOGLOBIN A1C: HEMOGLOBIN A1C: 5.7 % (ref 4.6–6.5)

## 2015-08-07 MED ORDER — LOSARTAN POTASSIUM 50 MG PO TABS: 50.0000 mg | ORAL_TABLET | Freq: Every day | ORAL | Status: DC

## 2015-08-07 NOTE — Assessment & Plan Note (Addendum)
Intolerant to statin.  Obtain flp. Discussed diet/exercise, weight loss.

## 2015-08-07 NOTE — Progress Notes (Signed)
Subjective:    Patient ID: Kelly Bell, female    DOB: 01/29/1941, 74 y.o.   MRN: [id number]  HPI   Kelly Bell is a 74 yr old female who presents today for follow up.    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  She had CXR at that was reportedly negative at that time.  She declines TB treatment.   DM2- she is currently maintained on amaryl 2mg  once daily. She has had some sugar in the 80's and some shakiness.   Lab Results  Component Value Date   HGBA1C 5.6 04/06/2015   HGBA1C 6.1 12/23/2014   HGBA1C 5.8 08/18/2014   Lab Results  Component Value Date   MICROALBUR 0.3 08/18/2014   LDLCALC 75 04/06/2015   CREATININE 0.82 05/07/2015   Hyperlipidemia- can't tolerate statin.  Declines statin therapy.   Lab Results  Component Value Date   CHOL 141 04/06/2015   HDL 38.10* 04/06/2015   LDLCALC 75 04/06/2015   LDLDIRECT 111.5 01/01/2007   TRIG 141.0 04/06/2015   CHOLHDL 4 04/06/2015   HTN- current BP meds include toprol xl, lisinopril and hctz. + dry cough.  BP Readings from Last 3 Encounters:  08/07/15 150/86  05/07/15 134/84  04/06/15 148/84     Review of Systems    see HPI  Past Medical History  Diagnosis Date  . Breast cancer     Right  . Hyperlipidemia   . HTN (hypertension)   . Type II or unspecified type diabetes mellitus without mention of complication, not stated as uncontrolled   . Irritable bladder   . Knee pain     Bilateral    Social History   Social History  . Marital Status: Married    Spouse Name: N/A  . Number of Children: 2  . Years of Education: 12   Occupational History  . 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 Smoking status: Never Smoker   . Smokeless tobacco: Never Used  . Alcohol Use: No  . Drug Use: No  . Sexual Activity: Not Currently   Other Topics Concern  . Not on file   Social History Narrative   HSG. Married 1965. 1 son - '68, 1  daughter-'72; 3 grandsons. 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                Past Surgical History  Procedure Laterality Date  . Mastectomy partial / lumpectomy w/ axillary lymphadenectomy    . Abdominal hysterectomy  1975  . Shoulder arthroscopy  2000  . Colonoscopy  05/10/05    Family History  Problem Relation Age of Onset  . Diabetes Mother 37    Deceased  . Pancreatic cancer Mother   . Diabetes Father 31    Deceased  . Heart failure Father   . Heart disease Father     CHF  . Breast cancer Sister     x2  . Cancer Brother     oral cancer  . Corneal Dystrophy Son     bilateral  . Healthy Daughter     x1    Allergies  Allergen Reactions  . Pravastatin Other (See Comments)    Severe muscle cramps.    Current Outpatient Prescriptions on File Prior to Visit  Medication Sig Dispense Refill  . ACCU-CHEK AVIVA PLUS test strip Use to check blood sugar once a  day. 100 each 1  . aspirin 325 MG tablet Take 325 mg by mouth daily.      . Calcium Carbonate-Vitamin D (CALTRATE 600+D) 600-400 MG-UNIT per tablet Take 1 tablet by mouth 2 (two) times daily.    . fluticasone (FLONASE) 50 MCG/ACT nasal spray USE TWO SPRAY IN EACH NOSTRIL ONCE DAILY 48 g 1  . glimepiride (AMARYL) 4 MG tablet Take 1 tablet by mouth  daily (Patient taking differently: take 1/2 tab by mouth once daily.) 90 tablet 0  . hydrochlorothiazide (HYDRODIURIL) 25 MG tablet TAKE 1/2 TABLET BY MOUTH EVERY DAY 45 tablet 1  . lisinopril (PRINIVIL,ZESTRIL) 10 MG tablet TAKE ONE TABLET BY MOUTH ONCE DAILY 30 tablet 5  . metoprolol succinate (TOPROL-XL) 25 MG 24 hr tablet Take 1 tablet by mouth  daily 90 tablet 0  . nabumetone (RELAFEN) 500 MG tablet TAKE ONE TABLET BY MOUTH TWICE DAILY as needed AFTER A MEAL FOR ARTHRITIS 180 tablet 1  . oxybutynin (DITROPAN) 5 MG tablet Take 1 tablet by mouth 4  times daily as needed 360 tablet 1  . pioglitazone (ACTOS) 30 MG tablet Take 1 tablet by mouth  daily  90 tablet 1  . [DISCONTINUED] oxybutynin (DITROPAN) 5 MG tablet TAKE ONE TABLET BY MOUTH 4 TIMES DAILY AS NEEDED 120 tablet 5   No current facility-administered medications on file prior to visit.    BP 150/86 mmHg  Pulse 68  Temp(Src) 98 F (36.7 C) (Oral)  Resp 16  Ht 5' (1.524 m)  Wt 251 lb 9.6 oz (114.125 kg)  BMI 49.14 kg/m2  SpO2 98%    Objective:   Physical Exam  Constitutional: She is oriented to person, place, and time. She appears well-developed and well-nourished.  HENT:  Head: Normocephalic and atraumatic.  Cardiovascular: Normal rate, regular rhythm and normal heart sounds.   No murmur heard. Pulmonary/Chest: Effort normal and breath sounds normal. No respiratory distress. She has no wheezes.  Musculoskeletal:  1+ bilateral LE edema  Neurological: She is alert and oriented to person, place, and time.  Psychiatric: She has a normal mood and affect. Her behavior is normal. Judgment and thought content normal.           Assessment & Plan:  Flu shot today.

## 2015-08-07 NOTE — Progress Notes (Signed)
Pre visit review using our clinic review tool, if applicable. No additional management support is needed unless otherwise documented below in the visit note. 

## 2015-08-07 NOTE — Assessment & Plan Note (Signed)
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 She declines treatment.  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 Monitor.

## 2015-08-07 NOTE — Assessment & Plan Note (Signed)
I think she is overtreated. D/c amaryl, continue actos.

## 2015-08-07 NOTE — Patient Instructions (Addendum)
Stop amaryl Stop lisinopril Start losartan. Complete lab work prior to leaving. Follow up in 2 weeks for nurse visit BP check and blood work ([facility] dx HTN) Follow up in 3 months.

## 2015-08-07 NOTE — Assessment & Plan Note (Signed)
BP above goal and pt is not tolerating ace due to cough. D/c lisinopril, start losartan, follow up in 2 weeks for bmet and nurse visit bp check.

## 2015-08-10 ENCOUNTER — Telehealth: Payer: Self-pay | Admitting: Family

## 2015-08-10 NOTE — Telephone Encounter (Signed)
See mychart.  

## 2015-08-12 ENCOUNTER — Encounter: Payer: Self-pay | Admitting: Family

## 2015-08-19 ENCOUNTER — Encounter: Payer: Self-pay | Admitting: Family

## 2015-08-21 ENCOUNTER — Telehealth: Payer: Self-pay | Admitting: Family

## 2015-08-21 ENCOUNTER — Other Ambulatory Visit: Payer: Medicare Other

## 2015-08-21 ENCOUNTER — Ambulatory Visit (INDEPENDENT_AMBULATORY_CARE_PROVIDER_SITE_OTHER): Payer: Medicare Other | Admitting: Family

## 2015-08-21 VITALS — BP 130/82 | HR 54

## 2015-08-21 DIAGNOSIS — I10 Essential (primary) hypertension: Secondary | ICD-10-CM | POA: Diagnosis not present

## 2015-08-21 NOTE — Progress Notes (Signed)
Pre visit review using our clinic review tool, if applicable. No additional management support is needed unless otherwise documented below in the visit note.  Patient in for BP check.

## 2015-08-21 NOTE — Patient Instructions (Signed)
BMET ( [id number]) Return for follow up in 3 months. Patient states she just got blood work 2 weeks doesn't understand why she would need to have again. Going out of town and will come back at end of week to have done fasting. Appointment scheduled for lab 08/28/15 @9 :45am.

## 2015-08-21 NOTE — Progress Notes (Signed)
BP Readings from Last 3 Encounters:  08/21/15 130/82  08/07/15 150/86  05/07/15 134/84   BP looks good.  Continue losartain.

## 2015-08-21 NOTE — Telephone Encounter (Signed)
Opened in error

## 2015-08-28 ENCOUNTER — Telehealth: Payer: Self-pay | Admitting: Family

## 2015-08-28 ENCOUNTER — Other Ambulatory Visit: Payer: Medicare Other

## 2015-08-28 NOTE — Telephone Encounter (Signed)
Left message for pt to return my call.

## 2015-08-28 NOTE — Telephone Encounter (Signed)
Pt is going back Monday for repeat on left breast.

## 2015-08-28 NOTE — Telephone Encounter (Signed)
Please let pt know that the radiologist would like her to complete some additional breast images for further evaluation. Let me know if she has not been contacted by them about a follow up appointment in 1 week.   

## 2015-09-01 ENCOUNTER — Other Ambulatory Visit: Payer: Self-pay | Admitting: Radiology

## 2015-09-02 ENCOUNTER — Telehealth: Payer: Self-pay | Admitting: Family

## 2015-09-02 NOTE — Telephone Encounter (Signed)
Notified pt. 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

## 2015-09-02 NOTE — Telephone Encounter (Signed)
I reviewed mammogram results- they should have been communicated with her already- radiologist wants her to have a biopsy. Please let me know if she does not hear back from them about scheduling biopsy.

## 2015-09-03 ENCOUNTER — Telehealth: Payer: Self-pay | Admitting: *Deleted

## 2015-09-03 DIAGNOSIS — C50212 Malignant neoplasm of upper-inner quadrant of left female breast: Secondary | ICD-10-CM

## 2015-09-03 NOTE — Telephone Encounter (Signed)
Confirmed [facility] for 09/09/15 at 1230 .  Instructions and contact information given.

## 2015-09-09 ENCOUNTER — Other Ambulatory Visit: Payer: Self-pay | Admitting: *Deleted

## 2015-09-09 ENCOUNTER — Ambulatory Visit: Payer: Medicare Other | Attending: General Surgery | Admitting: Physical Therapy

## 2015-09-09 ENCOUNTER — Encounter: Payer: Self-pay | Admitting: Oncology

## 2015-09-09 ENCOUNTER — Other Ambulatory Visit (HOSPITAL_BASED_OUTPATIENT_CLINIC_OR_DEPARTMENT_OTHER): Payer: Medicare Other

## 2015-09-09 ENCOUNTER — Ambulatory Visit
Admission: RE | Admit: 2015-09-09 | Discharge: 2015-09-09 | Disposition: A | Discharge status: Home / Self Care | Payer: Medicare Other | Source: Ambulatory Visit | Attending: Radiation Oncology | Admitting: Radiation Oncology

## 2015-09-09 ENCOUNTER — Encounter: Payer: Self-pay | Admitting: Nurse Practitioner

## 2015-09-09 ENCOUNTER — Encounter: Payer: Self-pay | Admitting: Physical Therapy

## 2015-09-09 ENCOUNTER — Telehealth: Payer: Self-pay | Admitting: Oncology

## 2015-09-09 ENCOUNTER — Ambulatory Visit (HOSPITAL_BASED_OUTPATIENT_CLINIC_OR_DEPARTMENT_OTHER): Payer: Medicare Other | Admitting: Oncology

## 2015-09-09 VITALS — BP 151/66 | HR 54 | Temp 98.6°F | Resp 18 | Ht 60.0 in | Wt 245.5 lb

## 2015-09-09 DIAGNOSIS — R293 Abnormal posture: Secondary | ICD-10-CM

## 2015-09-09 DIAGNOSIS — M899 Disorder of bone, unspecified: Secondary | ICD-10-CM

## 2015-09-09 DIAGNOSIS — C50212 Malignant neoplasm of upper-inner quadrant of left female breast: Secondary | ICD-10-CM

## 2015-09-09 DIAGNOSIS — Z170 Estrogen receptor positive status [ER+]: Secondary | ICD-10-CM | POA: Diagnosis not present

## 2015-09-09 DIAGNOSIS — D0512 Intraductal carcinoma in situ of left breast: Secondary | ICD-10-CM | POA: Diagnosis not present

## 2015-09-09 DIAGNOSIS — C50912 Malignant neoplasm of unspecified site of left female breast: Secondary | ICD-10-CM | POA: Insufficient documentation

## 2015-09-09 DIAGNOSIS — I890 Lymphedema, not elsewhere classified: Secondary | ICD-10-CM

## 2015-09-09 LAB — COMPREHENSIVE METABOLIC PANEL (CC13)
ALT: 20 U/L (ref 0–55)
AST: 20 U/L (ref 5–34)
Albumin: 4.1 g/dL (ref 3.5–5.0)
Alkaline Phosphatase: 59 U/L (ref 40–150)
Anion Gap: 6 mEq/L (ref 3–11)
BILIRUBIN TOTAL: 0.66 mg/dL (ref 0.20–1.20)
BUN: 15.2 mg/dL (ref 7.0–26.0)
CHLORIDE: 107 meq/L (ref 98–109)
CO2: 26 meq/L (ref 22–29)
Calcium: 10.3 mg/dL (ref 8.4–10.4)
Creatinine: 0.9 mg/dL (ref 0.6–1.1)
EGFR: 70 mL/min/{1.73_m2} — AB (ref >90)
GLUCOSE: 123 mg/dL (ref 70–140)
Potassium: 4.3 mEq/L (ref 3.5–5.1)
SODIUM: 140 meq/L (ref 136–145)
TOTAL PROTEIN: 7.3 g/dL (ref 6.4–8.3)

## 2015-09-09 LAB — CBC WITH DIFFERENTIAL/PLATELET
BASO%: 0.8 % (ref 0.0–2.0)
Basophils Absolute: 0 10*3/uL (ref 0.0–0.1)
EOS%: 0.9 % (ref 0.0–7.0)
Eosinophils Absolute: 0 10*3/uL (ref 0.0–0.5)
HCT: 42.5 % (ref 34.8–46.6)
HGB: 14 g/dL (ref 11.6–15.9)
LYMPH%: 42.7 % (ref 14.0–49.7)
MCH: 31.1 pg (ref 25.1–34.0)
MCHC: 33 g/dL (ref 31.5–36.0)
MCV: 94.2 fL (ref 79.5–101.0)
MONO#: 0.2 10*3/uL (ref 0.1–0.9)
MONO%: 6.7 % (ref 0.0–14.0)
NEUT%: 48.9 % (ref 38.4–76.8)
NEUTROS ABS: 1.6 10*3/uL (ref 1.5–6.5)
Platelets: 197 10*3/uL (ref 145–400)
RBC: 4.51 10*6/uL (ref 3.70–5.45)
RDW: 13.4 % (ref 11.2–14.5)
WBC: 3.2 10*3/uL — AB (ref 3.9–10.3)
lymph#: 1.4 10*3/uL (ref 0.9–3.3)

## 2015-09-09 NOTE — Progress Notes (Signed)
Kelly Bell is a very pleasant 74 y.o. female from [place], [place] with newly diagnosed ductal carcinoma in situ of the left breast.  Biopsy results revealed the tumor's prognostic profile is ER positive and PR positive.  She presents today to the [facility]) for treatment consideration and recommendations from the breast surgeon, radiation oncologist, and medical oncologist.     I briefly met with Kelly Bell during her [facility] visit today. We discussed the purpose of the Survivorship Clinic, which will include monitoring for recurrence, coordinating completion of age and gender-appropriate cancer screenings, promotion of overall wellness, as well as managing potential late/long-term side effects of anti-cancer treatments.    The treatment plan for Kelly Bell will likely include surgery (type depending results of upcoming MRI scan), +/- radiation therapy, and anti-estrogen therapy.  She will meet with the Genetics Counselor due to her personal history and family history of breast cancer. As of today, the intent of treatment for Kelly Bell is cure, therefore she will be eligible for the Survivorship Clinic upon her completion of treatment.  Her survivorship care plan (SCP) document will be drafted and updated throughout the course of her treatment trajectory. She will receive the SCP in an office visit with myself in the Survivorship Clinic once she has completed treatment.   Kelly Bell was encouraged to ask questions and all questions were answered to her satisfaction.  She was given my business card and encouraged to contact me with any concerns regarding survivorship.  I look forward to participating in her care.   Kenn File, [place] [phone]

## 2015-09-09 NOTE — Patient Instructions (Signed)

## 2015-09-09 NOTE — Therapy (Signed)
[place], [place], [address] Phone: [phone]   Fax:  [phone]  Physical Therapy Evaluation  Patient Details  Name: Kelly Bell MRN: [id number] Date of Birth: 11-21-1940 Referring Provider: Dr. Stark Klein  Encounter Date: 09/09/2015      PT End of Session - 09/09/15 1644    Visit Number 1   Number of Visits 1   PT Start Time 1530   PT Stop Time 1554   PT Time Calculation (min) 24 min   Activity Tolerance Patient tolerated treatment well   Behavior During Therapy [facility] for tasks assessed/performed      Past Medical History  Diagnosis Date  . Breast cancer ([place])     Right  . Hyperlipidemia   . HTN (hypertension)   . Type II or unspecified type diabetes mellitus without mention of complication, not stated as uncontrolled   . Irritable bladder   . Knee pain     Bilateral    Past Surgical History  Procedure Laterality Date  . Mastectomy partial / lumpectomy w/ axillary lymphadenectomy    . Abdominal hysterectomy  1975  . Shoulder arthroscopy  2000  . Colonoscopy  05/10/05    There were no vitals filed for this visit.  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            [facility] PT Assessment - 09/09/15 0001    Assessment   Medical Diagnosis Left breast cancer   Referring Provider Dr. Stark Klein   Onset Date/Surgical Date 08/25/15   Hand Dominance Left   Prior Therapy None recent   Precautions   Precautions Other (comment)   Active left breast cancer; hx right breast cancer/lymphedema   Restrictions   Weight Bearing Restrictions No   Balance Screen   Has the patient fallen in the past 6 months No   Has the patient had a decrease in activity level because of a fear of falling?  No   Is the patient reluctant to leave their home because of a fear of falling?  No   Home Ecologist residence   Living Arrangements Spouse/significant other   Available Help at Discharge Family   Prior Function   Level of [place] Retired   Leisure She goes to the [employer] twice a week and walks for 40 minutes on the treadmill or on a track   Cognition   Overall Cognitive Status Within Functional Limits for tasks assessed   Posture/Postural Control   Posture/Postural Control Postural limitations   Postural Limitations Forward head;Rounded Shoulders   ROM / Strength   AROM / PROM / Strength AROM;Strength   AROM   AROM Assessment Site Shoulder   Right/Left Shoulder Right;Left   Right Shoulder Extension 45 Degrees   Right Shoulder Flexion 131 Degrees   Right Shoulder ABduction 146 Degrees   Right Shoulder Internal Rotation 88 Degrees   Right Shoulder External Rotation 76 Degrees   Left  Shoulder Extension 50 Degrees   Left Shoulder Flexion 133 Degrees   Left Shoulder ABduction 140 Degrees   Left Shoulder Internal Rotation 78 Degrees   Left Shoulder External Rotation 75 Degrees   Strength   Overall Strength Within functional limits for tasks performed           LYMPHEDEMA/ONCOLOGY QUESTIONNAIRE - 09/09/15 1643    Type   Cancer Type Left breast cancer   Lymphedema Assessments   Lymphedema Assessments Upper extremities   Right Upper Extremity Lymphedema   10 cm Proximal to Olecranon Process 40.9 cm   Olecranon Process 30.4 cm   10 cm Proximal to Ulnar Styloid Process 26.4 cm   Just Proximal to Ulnar Styloid Process 18.9 cm   Across Hand at [employer] 19.6 cm    At [place] of 2nd Digit 6.8 cm   Left Upper Extremity Lymphedema   10 cm Proximal to Olecranon Process 39.7 cm   Olecranon Process 30.2 cm   10 cm Proximal to Ulnar Styloid Process 23.4 cm   Just Proximal to Ulnar Styloid Process 17.9 cm   Across Hand at [employer] 18.4 cm   At [place] of 2nd Digit 6.4 cm      Patient was instructed today in a home exercise program today for post op shoulder range of motion. These included active assist shoulder flexion in sitting, scapular retraction, wall walking with shoulder abduction, and hands behind head external rotation.  She was encouraged to do these twice a day, holding 3 seconds and repeating 5 times when permitted by her physician.         PT Education - 09/09/15 1644    Education provided Yes   Education Details Post op shoulder ROM HEP and lymphedema risk reduction   Person(s) Educated Patient   Methods Explanation;Demonstration;Handout   Comprehension Returned demonstration;Verbalized understanding              Breast Clinic Goals - 09/09/15 1647    Patient will be able to verbalize understanding of pertinent lymphedema risk reduction practices relevant to her diagnosis specifically related to skin care.   Time 1   Period Days   Status Achieved   Patient will be able to return demonstrate and/or verbalize understanding of the post-op home exercise program related to regaining shoulder range of motion.   Time 1   Period Days   Status Achieved   Patient will be able to verbalize understanding of the importance of attending the postoperative After Breast Cancer Class for further lymphedema risk reduction education and therapeutic exercise.   Time 1   Period Days   Status Achieved              Plan - 09/09/15 1645    Clinical Impression Statement Patient was diagnosed 08/25/15 with left breast cancer.  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  She has a history of right breast cancer in 1987 which resulted in  right arm lymphedema.  She is planning to have a left mastectomy and sentinel node biopsy followed by anti-estrogen therapy.  She will benefit from PT post-op to treat her current right arm lymphedema and regain left shoulder ROM and strength.   Pt will benefit from skilled therapeutic intervention in order to improve on the following deficits Pain;Decreased strength;Increased edema;Decreased knowledge of precautions;Impaired UE functional use;Decreased range of motion   Rehab Potential Good   Clinical Impairments Affecting Rehab Potential Right arm lymphedema due to right breast cancer  in 1987   PT Frequency One time visit   PT Treatment/Interventions Patient/family education;Therapeutic exercise   Consulted and Agree with Plan of Care Patient     Patient will follow up at outpatient cancer rehab if needed following surgery.  If the patient requires physical therapy at that time, a specific plan will be dictated and sent to the referring physician for approval. The patient was educated today on appropriate basic range of motion exercises to begin post operatively and the importance of attending the After Breast Cancer class following surgery.  Patient was educated today on lymphedema risk reduction practices as it pertains to recommendations that will benefit the patient immediately following surgery.  She verbalized good understanding.  No additional physical therapy is indicated at this time.         G-Codes - 10-Sep-2015 1648    Functional Assessment Tool Used Clinical Judgement   Functional Limitation Self care   Self Care Current Status [phone]) At least 1 percent but less than 20 percent impaired, limited or restricted   Self Care Goal Status (Z6010) At least 1 percent but less than 20 percent impaired, limited or restricted   Self Care Discharge Status [phone]) At least 1 percent but less than 20 percent impaired, limited or restricted       Problem List Patient Active Problem List    Diagnosis Date Noted  . Breast cancer of upper-inner quadrant of left female breast ([place]) 09-10-2015  . 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 Bradycardia 09/17/2014  . Edema 03/05/2013  . Obesity, Class II, BMI 35-39.9 06/23/2012  . Routine health maintenance 06/23/2012  . LOW BACK PAIN, ACUTE 04/22/2010  . ALLERGIC RHINITIS 08/12/2009  . Other specified disorders of bladder 06/03/2008  . KNEE PAIN, LEFT 06/03/2008  . 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 CHEST PAIN, ATYPICAL, HX OF 10/02/2007  . Diabetes type 2, controlled ([place]) 10/01/2007  . Hyperlipidemia 09/13/2007  . Essential hypertension 09/13/2007  . DEGENERATIVE JOINT DISEASE 09/13/2007  . BREAST CANCER, HX OF 09/13/2007   Annia Friendly, PT 10-Sep-2015 4:49 PM  [place], [place], [address] Phone: [phone]   Fax:  [phone]  Name: Kelly Bell MRN: [id number] Date of Birth: 1940/12/01

## 2015-09-09 NOTE — Progress Notes (Signed)
[facility] Psychosocial Distress Screening Counseling Intern  Counseling Intern was referred by distress screening protocol.  The patient scored a 5 on the Psychosocial Distress Thermometer which indicates Moderate distress. Counseling Intern Vaughan Sine to assess for distress and other psychosocial needs.    ONCBCN DISTRESS SCREENING 09/09/2015  Screening Type Initial Screening  Distress experienced in past week (1-10) 5  Information Concerns Type Lack of info about diagnosis   Met with Pt and son Vicente Males during [facility].  Pt reported that her distress decreased from a 5 to a 0 by the time of our encounter due to the gaining of information about her dx and tx.  Pt reported having a large support network including her family (children, siblings, husband) and many friends. Counselor provided information about supportive services available at [facility].    No follow up scheduled at this time.   Vaughan Sine Counseling Intern [phone]

## 2015-09-09 NOTE — Progress Notes (Signed)
[place]  [phone] [phone]     ID: Kelly Bell DOB: [birth year]  MR#: [id number]  QRF#:[id number]  Patient Care Team: Debbrah Alar, NP as PCP - General (Internal Medicine) Marybelle Killings, MD (Orthopedic Surgery) Stark Klein, MD as Consulting Physician (General Surgery) Chauncey Cruel, MD as Consulting Physician (Oncology) Kyung Rudd, MD as Consulting Physician (Radiation Oncology) Rockwell Germany, RN as Registered Nurse Mauro Kaufmann, RN as Registered Nurse Sylvan Cheese, NP as Nurse Practitioner (Hematology and Oncology) PCP: Nance Pear., NP OTHER MD: Rodell Perna MD  CHIEF COMPLAINT: Estrogen receptor positive breast cancer  CURRENT TREATMENT: Awaiting definitive surgery   BREAST CANCER HISTORY: [name] had a right lumpectomy in 1987 for which she describes as a "tiny" tumor which was lymph node negative. She had adjuvant radiation and then 5 years of tamoxifen under the care of Dr. Sonny Dandy.  More recently, bilateral screening mammography with tomosynthesis 08/25/2015 at [facility] found a new cluster of calcifications in the upper inner quadrant of the left breast. Left diagnostic mammography 08/31/2015 showed the breast composition to be category C. There was a suspicious area of calcifications in the left breast upper inner quadrant measuring 4 cm. This was biopsied 09/01/2015, and showed ([id number]) ductal carcinoma in situ, low-grade, involving a papilloma. The estrogen receptor was under percent positive. The progesterone receptor was under percent positive, both with strong staining intensity.  The patient's subsequent history is as detailed below.   INTERVAL HISTORY: May was evaluated in the multidisciplinary breast cancer clinic 09/09/2015. Her case was also presented in the multidisciplinary breast cancer conference that same morning. At that time a preliminary plan was proposed: Consider an MRI to better  delineate the extent of disease, with a second biopsy if necessary. This is to be followed by breast conserving surgery, with radiation and hormones as appropriate. Genetics counseling was also recommended.  REVIEW OF SYSTEMS: There were no specific symptoms leading to the original mammogram, which was routinely scheduled. The patient denies unusual headaches, visual changes, nausea, vomiting, stiff neck, dizziness, or gait imbalance. There has been no cough, phlegm production, or pleurisy, no chest pain or pressure, and no change in bowel or bladder habits. The patient denies fever, rash, bleeding, unexplained fatigue or unexplained weight loss. [facility]. A detailed review of systems was otherwise entirely negative.  PAST MEDICAL HISTORY: Past Medical History  Diagnosis Date  . Breast cancer ([place])     Right  . Hyperlipidemia   . HTN (hypertension)   . Type II or unspecified type diabetes mellitus without mention of complication, not stated as uncontrolled   . Irritable bladder   . Knee pain     Bilateral    PAST SURGICAL HISTORY: Past Surgical History  Procedure Laterality Date  . Mastectomy partial / lumpectomy w/ axillary lymphadenectomy    . Abdominal hysterectomy  1975  . Shoulder arthroscopy  2000  . Colonoscopy  05/10/05    FAMILY HISTORY Family History  Problem Relation Age of Onset  . Diabetes Mother 60    Deceased  . Pancreatic cancer Mother   . Diabetes Father 38    Deceased  . Heart failure Father   . Heart disease Father     CHF  . Breast cancer Sister     x2  . Cancer Brother     oral cancer  . Corneal Dystrophy Son  bilateral  . Healthy Daughter     x1   the patient's father died at the age of 37 from non-cancer related causes. The patient's mother died at the age of 78, from pancreatic cancer. The patient had 4 brothers, 4 sisters. One sister died from breast cancer at the age of 56. One brother died  from cancer of the head and neck. A second sister has been diagnosed recently with breast cancer. She is in her 47s. There is no history of ovarian cancer in the family  GYNECOLOGIC HISTORY:  No LMP recorded. Patient has had a hysterectomy. Menarche age 74, first live birth age 74, the patient is [place] P2. She underwent hysterectomy without salpingo-oophorectomy in 1975. She did not take hormone replacement. She took birth controls for some period of time in the past but does not recall how long. 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 She worked a total of 28 years in [place]. Her husband Ezell is a retired Chemical engineer. He used to work as a Company secretary as well. The patient's son Vicente Males is disabled and lives in [place]. The patient's daughter Luciana Axe also lives in [place]. She is a Theatre manager. The patient has 3 grandchildren. She attends a local [place]: Not in place   HEALTH MAINTENANCE: Social History  Substance Use Topics  . Smoking status: Never Smoker   . Smokeless tobacco: Never Used  . Alcohol Use: No     Colonoscopy: 2011/lobe our  PAP:  Bone density: 10/16/2014/[place]/D- 1.5 (osteopenia)  Lipid panel:  Allergies  Allergen Reactions  . Pravastatin Other (See Comments)    Severe muscle cramps.  . Lisinopril     cough    Current Outpatient Prescriptions  Medication Sig Dispense Refill  . ACCU-CHEK AVIVA PLUS test strip Use to check blood sugar once a day. 100 each 1  . aspirin 325 MG tablet Take 325 mg by mouth daily.      . Calcium Carbonate-Vitamin D (CALTRATE 600+D) 600-400 MG-UNIT per tablet Take 1 tablet by mouth 2 (two) times daily.    . fluticasone (FLONASE) 50 MCG/ACT nasal spray USE TWO SPRAY IN EACH NOSTRIL ONCE DAILY 48 g 1  . hydrochlorothiazide (HYDRODIURIL) 25 MG tablet TAKE 1/2 TABLET BY MOUTH EVERY DAY 45 tablet 1  . losartan (COZAAR) 50 MG tablet Take 1 tablet (50 mg  total) by mouth daily. 30 tablet 2  . metoprolol succinate (TOPROL-XL) 25 MG 24 hr tablet Take 1 tablet by mouth  daily 90 tablet 0  . nabumetone (RELAFEN) 500 MG tablet TAKE ONE TABLET BY MOUTH TWICE DAILY as needed AFTER A MEAL FOR ARTHRITIS 180 tablet 1  . oxybutynin (DITROPAN) 5 MG tablet Take 1 tablet by mouth 4  times daily as needed 360 tablet 1  . pioglitazone (ACTOS) 30 MG tablet Take 1 tablet by mouth  daily 90 tablet 1  . glimepiride (AMARYL) 4 MG tablet     . [DISCONTINUED] oxybutynin (DITROPAN) 5 MG tablet TAKE ONE TABLET BY MOUTH 4 TIMES DAILY AS NEEDED 120 tablet 5   No current facility-administered medications for this visit.    OBJECTIVE: Older African-American Bell who appears stated age 84 Vitals:   09/09/15 1321  BP: 151/66  Pulse: 54  Temp: 98.6 F (37 C)  Resp: 18     Body mass index is 47.95 kg/(m^2).    ECOG FS:0 - Asymptomatic  Ocular: Sclerae unicteric,  pupils equal, round and reactive to light Ear-nose-throat: Oropharynx clear and moist Lymphatic: No cervical or supraclavicular adenopathy Lungs no rales or rhonchi, good excursion bilaterally Heart regular rate and rhythm, no murmur appreciated Abd soft, obese, nontender, positive bowel sounds MSK no focal spinal tenderness, no joint edema Neuro: non-focal, well-oriented, appropriate affect Breasts: The right breast is status post remote lumpectomy. There are no palpable masses and no skin or nipple changes of concern. The right axilla is benign. The left breast is status post recent biopsy. I do not palpate a mass. Left axilla is benign.   LAB RESULTS:  CMP     Component Value Date/Time   NA 140 09/09/2015 1209   NA 141 08/07/2015 1026   K 4.3 09/09/2015 1209   K 4.2 08/07/2015 1026   CL 107 08/07/2015 1026   CO2 26 09/09/2015 1209   CO2 29 08/07/2015 1026   GLUCOSE 123 09/09/2015 1209   GLUCOSE 107* 08/07/2015 1026   BUN 15.2 09/09/2015 1209   BUN 13 08/07/2015 1026   CREATININE 0.9  09/09/2015 1209   CREATININE 0.81 08/07/2015 1026   CREATININE 0.76 04/17/2014 1115   CALCIUM 10.3 09/09/2015 1209   CALCIUM 9.7 08/07/2015 1026   PROT 7.3 09/09/2015 1209   PROT 6.3 04/17/2014 1115   ALBUMIN 4.1 09/09/2015 1209   ALBUMIN 4.0 04/17/2014 1115   AST 20 09/09/2015 1209   AST 17 04/17/2014 1115   ALT 20 09/09/2015 1209   ALT 15 04/17/2014 1115   ALKPHOS 59 09/09/2015 1209   ALKPHOS 44 04/17/2014 1115   BILITOT 0.66 09/09/2015 1209   BILITOT 0.5 04/17/2014 1115   GFRNONAA 78 04/17/2014 1115   GFRNONAA 91.32 07/30/2009 1028   GFRAA 89 04/17/2014 1115   GFRAA  09/09/2007 2130    >60        The eGFR has been calculated using the MDRD equation. This calculation has not been validated in all clinical    INo results found for: SPEP, UPEP  Lab Results  Component Value Date   WBC 3.2* 09/09/2015   NEUTROABS 1.6 09/09/2015   HGB 14.0 09/09/2015   HCT 42.5 09/09/2015   MCV 94.2 09/09/2015   PLT 197 09/09/2015      Chemistry      Component Value Date/Time   NA 140 09/09/2015 1209   NA 141 08/07/2015 1026   K 4.3 09/09/2015 1209   K 4.2 08/07/2015 1026   CL 107 08/07/2015 1026   CO2 26 09/09/2015 1209   CO2 29 08/07/2015 1026   BUN 15.2 09/09/2015 1209   BUN 13 08/07/2015 1026   CREATININE 0.9 09/09/2015 1209   CREATININE 0.81 08/07/2015 1026   CREATININE 0.76 04/17/2014 1115      Component Value Date/Time   CALCIUM 10.3 09/09/2015 1209   CALCIUM 9.7 08/07/2015 1026   ALKPHOS 59 09/09/2015 1209   ALKPHOS 44 04/17/2014 1115   AST 20 09/09/2015 1209   AST 17 04/17/2014 1115   ALT 20 09/09/2015 1209   ALT 15 04/17/2014 1115   BILITOT 0.66 09/09/2015 1209   BILITOT 0.5 04/17/2014 1115       No results found for: LABCA2  No components found for: ZOXWR604  No results for input(s): INR in the last 168 hours.  Urinalysis    Component Value Date/Time   COLORURINE LT. YELLOW 03/05/2013 0930   APPEARANCEUR CLEAR 03/05/2013 0930   LABSPEC 1.020  03/05/2013 0930   PHURINE 6.0 03/05/2013 0930   GLUCOSEU 100  03/05/2013 0930   HGBUR NEGATIVE 03/05/2013 0930   BILIRUBINUR NEGATIVE 03/05/2013 0930   KETONESUR NEGATIVE 03/05/2013 0930   UROBILINOGEN 0.2 03/05/2013 0930   NITRITE NEGATIVE 03/05/2013 0930   LEUKOCYTESUR NEGATIVE 03/05/2013 0930    STUDIES: No results found.  ASSESSMENT: 74 y.o. Kelly Bell  (1) status post right lumpectomy in 1987 for a stage I invasive breast cancer, treated with adjuvant radiation and tamoxifen for 5 years  (2) status post left breast upper inner quadrant biopsy 09/01/2015 for ductal carcinoma in situ, low-grade, extending approximately 4 cm, estrogen and progesterone receptor positive  (3) definitive surgery pending  (4) adjuvant radiation to follow surgery is appropriate  (5) consider adjuvant anti-estrogens following local treatment  (6) genetics testing pending.  (7) osteopenia, with bone density December 2015 showing a T score of -1.5  PLAN: We spent the better part of today's hour-long appointment discussing the biology of breast cancer in general, and the specifics of the patient's tumor in particular. Sheniece understands that in noninvasive ductal carcinoma, also called ductal carcinoma in situ ("DCIS") the breast cancer cells remain trapped in the ducts were they started. They cannot travel to a vital organ. For that reason these cancers in themselves are not life-threatening.  If the whole breast is removed then all the ducts are removed and since the cancer cells are trapped in the ducts, the cure rate with mastectomy for noninvasive breast cancer is approximately 99%. Nevertheless we recommend lumpectomy, because there is no survival advantage to mastectomy and because the cosmetic result is generally superior with breast conservation. However, with a 4 cm area of calcifications noted, the possibility of mastectomy needs to be considered and so we are obtaining an MRI of the breast and  considering if necessary other biopsies before making a definitive recommendation  If the patient has a lumpectomy she understands adjuvant radiation will follow  We then discussed antiestrogen's in this setting. In general ductal carcinoma in situ should be considered as a local problem and local treatment is the mainstay. Nevertheless antiestrogen is would further reduce the risk of local recurrence, which will be low in any case. Of more importance, anti-estrogens can cut in half the risk of a new breast cancer developing in either breast in the future. That risk approximates 1% per year  I am tentatively scheduling made to return to see me in January, by which time her local treatment should have been completed. Since she does have osteopenia one option for her would be to take another 5 years of tamoxifen, which she tolerated well previously. She has a good understanding of the possible toxicities, side effects and complications of this agent. We will also discuss aromatase inhibitors at that time  Aika has a good understanding of the overall plan. She agrees with it. She knows the goal of treatment in her case is cure. She will call with any problems that may develop before her next visit here.  Chauncey Cruel, MD   09/09/2015 4:02 PM Medical Oncology and Hematology [facility] [address] [place], [place] [address] Tel. [phone]    Fax. [phone]

## 2015-09-09 NOTE — Telephone Encounter (Signed)
lvm for pt regarding to Jan 2017 appt...the patient ok and aware

## 2015-09-10 ENCOUNTER — Encounter: Payer: Self-pay | Admitting: Family

## 2015-09-11 ENCOUNTER — Encounter: Payer: Self-pay | Admitting: Radiation Oncology

## 2015-09-11 NOTE — Progress Notes (Signed)
Radiation Oncology         [phone] ________________________________  Name: [name] MRN: [id number]  Date: 09/09/2015  DOB: 08-Dec-1940  CC:O'SULLIVAN,MELISSA S., NP  Stark Klein, MD     REFERRING PHYSICIAN: Stark Klein, MD   DIAGNOSIS: The encounter diagnosis was Breast cancer of upper-inner quadrant of left female breast ([place]).  Breast cancer of upper-inner quadrant of left female breast [facility])   Staging form: Breast, AJCC 7th Edition     Clinical stage from 09/09/2015: Stage 0 (Tis (DCIS), N0, M0) - Unsigned    HISTORY OF PRESENT ILLNESS::Kelly Bell is a 74 y.o. 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  The patient presented was originally with suspicious calcifications on screening mammogram. A diagnostic mammogram confirmed this finding and a stereotactic biopsy of this area was performed. This returned positive for DCIS. Notably, loosely grouped calcifications including this area and surrounding this area measure up to 10 cm. Receptors studies have been performed and the tumor is estrogen receptor positive and progesterone receptor positive.  The patient has not undergone an MRI scan of the breasts. This was discussed as a possibility in multidisciplinary breast conference to further examine the potential area of involvement.    PREVIOUS RADIATION THERAPY: No   PAST MEDICAL HISTORY:  has a past medical history of Breast cancer ([place]); Hyperlipidemia; HTN (hypertension); Type II or unspecified type diabetes mellitus without mention of complication, not stated as uncontrolled; Irritable bladder; and Knee pain.     PAST SURGICAL HISTORY: Past Surgical History  Procedure Laterality Date  . Mastectomy partial / lumpectomy w/ axillary lymphadenectomy    . Abdominal hysterectomy  1975  . Shoulder arthroscopy  2000  . Colonoscopy  05/10/05     FAMILY HISTORY: family history includes Breast  cancer in her sister; Cancer in her brother; Corneal Dystrophy in her son; Diabetes (age of onset: 82) in her father and mother; Healthy in her daughter; Heart disease in her father; Heart failure in her father; Pancreatic cancer in her mother.   SOCIAL HISTORY:  reports that she has never smoked. She has never used smokeless tobacco. She reports that she does not drink alcohol or use illicit drugs.   ALLERGIES: Pravastatin and Lisinopril   MEDICATIONS:  Current Outpatient Prescriptions  Medication Sig Dispense Refill  . ACCU-CHEK AVIVA PLUS test strip Use to check blood sugar once a day. 100 each 1  . aspirin 325 MG tablet Take 325 mg by mouth daily.      . Calcium Carbonate-Vitamin D (CALTRATE 600+D) 600-400 MG-UNIT per tablet Take 1 tablet by mouth 2 (two) times daily.    . fluticasone (FLONASE) 50 MCG/ACT nasal spray USE TWO SPRAY IN EACH NOSTRIL ONCE DAILY 48 g 1  . glimepiride (AMARYL) 4 MG tablet     . hydrochlorothiazide (HYDRODIURIL) 25 MG tablet TAKE 1/2 TABLET BY MOUTH EVERY DAY 45 tablet 1  . losartan (COZAAR) 50 MG tablet Take 1 tablet (50 mg total) by mouth daily. 30 tablet 2  . metoprolol succinate (TOPROL-XL) 25 MG 24 hr tablet Take 1 tablet by mouth  daily 90 tablet 0  . nabumetone (RELAFEN) 500 MG tablet TAKE ONE TABLET BY MOUTH TWICE DAILY as needed AFTER A MEAL FOR ARTHRITIS 180 tablet 1  . oxybutynin (DITROPAN) 5 MG tablet Take 1 tablet by mouth 4  times daily as needed 360 tablet 1  . pioglitazone (ACTOS) 30 MG  tablet Take 1 tablet by mouth  daily 90 tablet 1  . [DISCONTINUED] oxybutynin (DITROPAN) 5 MG tablet TAKE ONE TABLET BY MOUTH 4 TIMES DAILY AS NEEDED 120 tablet 5   No current facility-administered medications for this encounter.     REVIEW OF SYSTEMS:  A 15 point review of systems is documented in the electronic medical record. This was obtained by the nursing staff. However, I reviewed this with the patient to discuss relevant findings and make appropriate  changes.  Pertinent items are noted in HPI.    PHYSICAL EXAM:  vitals were not taken for this visit.  ECOG = 0  0 - Asymptomatic (Fully active, able to carry on all predisease activities without restriction)  1 - Symptomatic but completely ambulatory (Restricted in physically strenuous activity but ambulatory and able to carry out work of a light or sedentary nature. For example, light housework, office work)  2 - Symptomatic, <50% in bed during the day (Ambulatory and capable of all self care but unable to carry out any work activities. Up and about more than 50% of waking hours)  3 - Symptomatic, >50% in bed, but not bedbound (Capable of only limited self-care, confined to bed or chair 50% or more of waking hours)  4 - Bedbound (Completely disabled. Cannot carry on any self-care. Totally confined to bed or chair)  5 - Death   Eustace Pen MM, Creech RH, Tormey DC, et al. [phone]). "Toxicity and response criteria of the [facility]". [place] Oncol. 5 (6): 649-55  General: Well-developed, in no acute distress HEENT: Normocephalic, atraumatic; oral cavity clear Neck: Supple without any lymphadenopathy Cardiovascular: Regular rate and rhythm Respiratory: Clear to auscultation bilaterally Breasts: The patient is status post biopsy of the left breast. No discrete mass present within the left breast and no axillary adenopathy. GI: Soft, nontender, normal bowel sounds Extremities: No edema present Neuro: No focal deficits     LABORATORY DATA:  Lab Results  Component Value Date   WBC 3.2* 09/09/2015   HGB 14.0 09/09/2015   HCT 42.5 09/09/2015   MCV 94.2 09/09/2015   PLT 197 09/09/2015   Lab Results  Component Value Date   NA 140 09/09/2015   K 4.3 09/09/2015   CL 107 08/07/2015   CO2 26 09/09/2015   Lab Results  Component Value Date   ALT 20 09/09/2015   AST 20 09/09/2015   ALKPHOS 59 09/09/2015   BILITOT 0.66 09/09/2015      RADIOGRAPHY: No  results found.     IMPRESSION:  The patient has a recent diagnosis of DCIS of the left breast. Given the large area of potential involvement, an MRI scan will be completed to gain further information to determine if she intentionally is a breast conservation candidate. Additional biopsy will be necessary if this is further entertained as discussed in clinic today. The patient is highly motivated however to proceed with a lumpectomy if at all possible. Given her breast size, this may be possible.  I discussed with the patient the role of adjuvant radiation treatment in this setting. We discussed the potential benefit of radiation treatment, especially with regards to local control of the patient's tumor. We also discussed the possible side effects and risks of such a treatment as well.  All of the patient's questions were answered. The patient wishes to proceed with radiation treatment at the appropriate time.  PLAN: I look forward to seeing the patient postoperatively to review her case and  further discuss and coordinate an anticipated course of radiation treatment. If the patient does proceed with a lumpectomy, then I would recommend adjuvant radiation treatment for her given her health status. This can be discussed further with her postoperatively. In the event of a mastectomy, I would not anticipate the need for postmastectomy radiation treatment.      ________________________________   Jodelle Gross, MD, PhD   **Disclaimer: This note was dictated with voice recognition software. Similar sounding words can inadvertently be transcribed and this note may contain transcription errors which may not have been corrected upon publication of note.**

## 2015-09-12 ENCOUNTER — Other Ambulatory Visit: Payer: Medicare Other

## 2015-09-12 ENCOUNTER — Other Ambulatory Visit: Payer: Self-pay | Admitting: Family

## 2015-09-14 ENCOUNTER — Ambulatory Visit
Admission: RE | Admit: 2015-09-14 | Discharge: 2015-09-14 | Disposition: A | Discharge status: Home / Self Care | Payer: Medicare Other | Source: Ambulatory Visit | Attending: General Surgery | Admitting: General Surgery

## 2015-09-14 DIAGNOSIS — C50212 Malignant neoplasm of upper-inner quadrant of left female breast: Secondary | ICD-10-CM

## 2015-09-14 MED ORDER — GADOBENATE DIMEGLUMINE 529 MG/ML IV SOLN: 20.0000 mL | Freq: Once | INTRAVENOUS | Status: DC | PRN

## 2015-09-15 ENCOUNTER — Telehealth: Payer: Self-pay | Admitting: *Deleted

## 2015-09-15 NOTE — Telephone Encounter (Signed)
Spoke with patient from [facility] 09/09/15.  She is doing well.  Encouraged her to call with any needs or concerns.

## 2015-09-21 ENCOUNTER — Telehealth: Payer: Self-pay | Admitting: *Deleted

## 2015-09-21 NOTE — Telephone Encounter (Signed)
Left vm for pt to return call to discuss further bx. Contact information provided.

## 2015-09-22 ENCOUNTER — Ambulatory Visit (HOSPITAL_BASED_OUTPATIENT_CLINIC_OR_DEPARTMENT_OTHER): Payer: Medicare Other | Admitting: Genetic Counselor

## 2015-09-22 ENCOUNTER — Other Ambulatory Visit: Payer: Medicare Other

## 2015-09-22 ENCOUNTER — Encounter: Payer: Self-pay | Admitting: Genetic Counselor

## 2015-09-22 DIAGNOSIS — Z853 Personal history of malignant neoplasm of breast: Secondary | ICD-10-CM

## 2015-09-22 DIAGNOSIS — Z315 Encounter for genetic counseling: Secondary | ICD-10-CM | POA: Diagnosis not present

## 2015-09-22 DIAGNOSIS — Z800 Family history of malignant neoplasm of digestive organs: Secondary | ICD-10-CM

## 2015-09-22 DIAGNOSIS — D0512 Intraductal carcinoma in situ of left breast: Secondary | ICD-10-CM

## 2015-09-22 DIAGNOSIS — Z803 Family history of malignant neoplasm of breast: Secondary | ICD-10-CM | POA: Diagnosis not present

## 2015-09-22 DIAGNOSIS — C50212 Malignant neoplasm of upper-inner quadrant of left female breast: Secondary | ICD-10-CM

## 2015-09-22 NOTE — Progress Notes (Signed)
REFERRING PROVIDER: Lurline Del, MD  PRIMARY PROVIDER:  Nance Pear., NP  PRIMARY REASON FOR VISIT:  1. BREAST CANCER, HX OF   2. Breast cancer of upper-inner quadrant of left female breast ([place])   3. Family history of breast cancer   4. Family history of pancreatic cancer   5. Family history of oral cancer      HISTORY OF PRESENT ILLNESS:   Kelly Bell, a 74 y.o. female, was seen for a  cancer genetics consultation at the request of Dr. Jana Hakim due to a personal history of breast cancer diagnosed at 40 and in the other breast at 65, and family history of breast and pancreatic cancers.  Ms. Corprew presents to clinic today to discuss the possibility of a hereditary predisposition to cancer, genetic testing, and to further clarify her future cancer risks, as well as potential cancer risks for family members.   In 74, at the age of 11, Ms. Gibas was diagnosed with cancer of the right breast. This was treated with right lumpectomy, radiation, and five years of tamoxifen.  In 2016, at the age of 48, Ms. Mynhier was diagnosed with DCIS of the left breast.  Hormone receptor status was ER/PR+.  Breast MRI results are pending.   CANCER HISTORY:   No history exists.     HORMONAL RISK FACTORS:  Menarche was at age 27.  First live birth at age 89.  OCP use for approximately -  she is not sure how long them Ovaries intact: yes.  Hysterectomy: yes, in 1975 Menopausal status: postmenopausal.  HRT use: 0 years. Colonoscopy: yes; last was in 06/2010 and found 3 tubular adenomas of ascending colon; one adenomatous polyp found in 2006; is on a 5-year schedule. Mammogram within the last year: yes. Number of breast biopsies: 2. Up to date with pelvic exams:  n/a. Any excessive radiation exposure in the past:  no  Past Medical History  Diagnosis Date  . Breast cancer ([place])     Right  . Hyperlipidemia   . HTN (hypertension)   . Type II or unspecified type diabetes  mellitus without mention of complication, not stated as uncontrolled   . Irritable bladder   . Knee pain     Bilateral    Past Surgical History  Procedure Laterality Date  . Mastectomy partial / lumpectomy w/ axillary lymphadenectomy    . Abdominal hysterectomy  1975  . Shoulder arthroscopy  2000  . Colonoscopy  05/10/05    Social History   Social History  . Marital Status: Married    Spouse Name: N/A  . Number of Children: 2  . Years of Education: 12   Occupational History  . 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 Smoking status: Never Smoker   . Smokeless tobacco: Never Used  . Alcohol Use: No  . Drug Use: No  . Sexual Activity: Not Currently   Other Topics Concern  . Not on file   Social History Narrative   HSG. Married 1965. 1 son - '68, 1 daughter-'72; 3 grandsons. 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                 FAMILY HISTORY:  We obtained a detailed, 4-generation family history.  Significant diagnoses are listed below: Family History  Problem Relation Age of Onset  . Diabetes Mother 26    Deceased  . Pancreatic cancer Mother  dx. early 66s  . Diabetes Father 27    Deceased  . Heart failure Father   . Heart disease Father     CHF  . Breast cancer Sister 76  . Cancer Brother     oral cancer - metastasis to lungs; smoker  . Corneal Dystrophy Son     bilateral  . Healthy Daughter     x1  . Breast cancer Sister     dx. 71s  . Cancer Cousin     oral cancer dx. <60; +EtOH, smoker; daughter of father's paternal half-brother    Ms. Vessey has one son, age 18, and one daughter, age 30.  She has five full sisters and five full brothers.  One brother died of metastatic oral cancer before the age of 82.  Her four remaining brothers are between the ages of 31 and 2 and have never had cancer.  One sister died at 70 from an unspecified cause.  Another sister was diagnosed  with breast cancer and passed away at 21.  This sister had two sons and one daughter--none of whom have had cancer.  Another sister was recently diagnosed with breast cancer in her 32s.  This sister has three daughters who have never had cancer.  Two other sisters are currently in their early 17s and have never had cancer.    Ms. Cost mother was diagnosed with breast cancer and passed away at 53.  Her mother had one full sister and four full brothers.  All of her siblings passed away in their 70s-80s.  Ms. Lannan is unaware of any cancer diagnoses in her maternal aunts/uncles or maternal first cousins.  Her maternal grandmother died at 39.  She has no information for her maternal grandfather.    Ms. Nasby father died at 5 of congestive heart failure.  He had one full brother and one paternal half-brother.  Both of these paternal uncles died over the age of 50 with issues related to a "bad heart".  The paternal half-brother had a daughter who was diagnosed with oral cancer younger than 60.  She was a smoker.  Ms. Boudreau has no information for her paternal grandparents.    Patient's maternal ancestors are of African-American descent, and paternal ancestors are of Native [place] and African-American descent. There is no reported Ashkenazi Jewish ancestry. There is no known consanguinity.  GENETIC COUNSELING ASSESSMENT: YOKO MCGAHEE is a 74 y.o. female with a personal and family history which is somewhat suggestive of a hereditary breast cancer syndrome and predisposition to cancer. We, therefore, discussed and recommended the following at today's visit.   DISCUSSION: We reviewed the characteristics, features and inheritance patterns of hereditary cancer syndromes, particularly those caused by mutations within the BRCA1/2 genes. We also discussed genetic testing, including the appropriate family members to test, the process of testing, insurance coverage and turn-around-time for results. We discussed  the implications of a negative, positive and/or variant of uncertain significant result. We recommended Ms. Tauzin pursue genetic testing for the 20-gene Breast/Ovarian Cancer Panel through [facility] Hope Pigeon, MD).  The Breast/Ovarian Cancer Panel offered by [facility] includes sequencing and deletion/duplication analysis for the following 19 genes:  ATM, BARD1, BRCA1, BRCA2, BRIP1, CDH1, CHEK2, FANCC, MLH1, MSH2, MSH6, NBN, PALB2, PMS2, PTEN, RAD51C, RAD51D, TP53, and XRCC2.  This panel also includes deletion/duplication analysis (without sequencing) for one gene, EPCAM.  Based on Ms. Piehl's personal and family history of cancer, she meets medical criteria for genetic testing. Despite that she meets  criteria, she may still have an out of pocket cost. We discussed that if her out of pocket cost for testing is over $100, the laboratory will call and confirm whether she wants to proceed with testing.  If the out of pocket cost of testing is less than $100 she will be billed by the genetic testing laboratory.   PLAN: Despite our recommendation, Ms. Capshaw did not wish to pursue genetic testing at today's visit. We understand this decision, and remain available to coordinate genetic testing at any time in the future. We; therefore, recommend Ms. Davilla continue to follow the cancer screening guidelines given by her oncology and primary healthcare providers.  Thank you for the referral and allowing [place] to share in the care of your patient.   Jeanine Luz, MS Genetic Counselor [email or web address] Phone: [phone]  The patient was seen for a total of 35 minutes in face-to-face genetic counseling.  This patient was discussed with Drs. Magrinat, Lindi Adie and/or Burr Medico who agrees with the above.    _______________________________________________________________________ For Office Staff:  Number of people involved in session: 1 Was an Intern/ student involved with case: no

## 2015-09-24 ENCOUNTER — Other Ambulatory Visit: Payer: Self-pay | Admitting: Radiology

## 2015-10-08 ENCOUNTER — Other Ambulatory Visit: Payer: Self-pay | Admitting: General Surgery

## 2015-10-08 DIAGNOSIS — C50212 Malignant neoplasm of upper-inner quadrant of left female breast: Secondary | ICD-10-CM

## 2015-10-15 ENCOUNTER — Encounter: Payer: Self-pay | Admitting: *Deleted

## 2015-10-20 ENCOUNTER — Encounter: Payer: Self-pay | Admitting: *Deleted

## 2015-10-20 NOTE — Progress Notes (Signed)
Pt called in question of surgery date. Pt relayed she was contacted last week by [facility] stating they were waiting on pre-authorization from insurance company. Pt called her insurance company and they informed her they have not received any information. I reached out to [place] scheduler to request a f/u with their pre-authorization depart.

## 2015-10-22 ENCOUNTER — Encounter (HOSPITAL_BASED_OUTPATIENT_CLINIC_OR_DEPARTMENT_OTHER): Payer: Self-pay | Admitting: *Deleted

## 2015-10-26 ENCOUNTER — Encounter (HOSPITAL_BASED_OUTPATIENT_CLINIC_OR_DEPARTMENT_OTHER)
Admission: RE | Admit: 2015-10-26 | Discharge: 2015-10-26 | Disposition: A | Discharge status: Home / Self Care | Payer: Medicare Other | Source: Ambulatory Visit | Attending: General Surgery | Admitting: General Surgery

## 2015-10-26 ENCOUNTER — Other Ambulatory Visit: Payer: Self-pay

## 2015-10-26 DIAGNOSIS — Z853 Personal history of malignant neoplasm of breast: Secondary | ICD-10-CM | POA: Diagnosis not present

## 2015-10-26 DIAGNOSIS — Z803 Family history of malignant neoplasm of breast: Secondary | ICD-10-CM | POA: Diagnosis not present

## 2015-10-26 DIAGNOSIS — Z90710 Acquired absence of both cervix and uterus: Secondary | ICD-10-CM | POA: Diagnosis not present

## 2015-10-26 DIAGNOSIS — Z808 Family history of malignant neoplasm of other organs or systems: Secondary | ICD-10-CM | POA: Diagnosis not present

## 2015-10-26 DIAGNOSIS — E119 Type 2 diabetes mellitus without complications: Secondary | ICD-10-CM | POA: Diagnosis not present

## 2015-10-26 DIAGNOSIS — E7800 Pure hypercholesterolemia, unspecified: Secondary | ICD-10-CM | POA: Diagnosis not present

## 2015-10-26 DIAGNOSIS — Z79899 Other long term (current) drug therapy: Secondary | ICD-10-CM | POA: Diagnosis not present

## 2015-10-26 DIAGNOSIS — Z7984 Long term (current) use of oral hypoglycemic drugs: Secondary | ICD-10-CM | POA: Diagnosis not present

## 2015-10-26 DIAGNOSIS — D0512 Intraductal carcinoma in situ of left breast: Secondary | ICD-10-CM | POA: Diagnosis not present

## 2015-10-26 DIAGNOSIS — I10 Essential (primary) hypertension: Secondary | ICD-10-CM | POA: Diagnosis not present

## 2015-10-26 DIAGNOSIS — Z800 Family history of malignant neoplasm of digestive organs: Secondary | ICD-10-CM | POA: Diagnosis not present

## 2015-10-26 LAB — BASIC METABOLIC PANEL
Anion gap: 6 (ref 5–15)
BUN: 9 mg/dL (ref 6–20)
CHLORIDE: 107 mmol/L (ref 101–111)
CO2: 26 mmol/L (ref 22–32)
Calcium: 9.6 mg/dL (ref 8.9–10.3)
Creatinine, Ser: 0.71 mg/dL (ref 0.44–1.00)
GFR calc non Af Amer: >60 mL/min (ref >60)
Glucose, Bld: 110 mg/dL — ABNORMAL HIGH (ref 65–99)
POTASSIUM: 3.8 mmol/L (ref 3.5–5.1)
SODIUM: 139 mmol/L (ref 135–145)

## 2015-10-28 ENCOUNTER — Encounter (HOSPITAL_BASED_OUTPATIENT_CLINIC_OR_DEPARTMENT_OTHER): Payer: Self-pay | Admitting: Anesthesiology

## 2015-10-28 ENCOUNTER — Ambulatory Visit (HOSPITAL_BASED_OUTPATIENT_CLINIC_OR_DEPARTMENT_OTHER)
Admission: RE | Admit: 2015-10-28 | Discharge: 2015-10-29 | Disposition: A | Discharge status: Home / Self Care | Payer: Medicare Other | Source: Ambulatory Visit | Attending: General Surgery | Admitting: General Surgery

## 2015-10-28 ENCOUNTER — Ambulatory Visit (HOSPITAL_COMMUNITY)
Admission: RE | Admit: 2015-10-28 | Discharge: 2015-10-28 | Disposition: A | Discharge status: Home / Self Care | Payer: Medicare Other | Source: Ambulatory Visit | Attending: General Surgery | Admitting: General Surgery

## 2015-10-28 ENCOUNTER — Ambulatory Visit (HOSPITAL_BASED_OUTPATIENT_CLINIC_OR_DEPARTMENT_OTHER): Payer: Medicare Other | Admitting: Anesthesiology

## 2015-10-28 ENCOUNTER — Encounter (HOSPITAL_BASED_OUTPATIENT_CLINIC_OR_DEPARTMENT_OTHER)
Admission: RE | Disposition: A | Discharge status: Home / Self Care | Payer: Self-pay | Source: Ambulatory Visit | Attending: General Surgery

## 2015-10-28 DIAGNOSIS — E7800 Pure hypercholesterolemia, unspecified: Secondary | ICD-10-CM | POA: Diagnosis not present

## 2015-10-28 DIAGNOSIS — I10 Essential (primary) hypertension: Secondary | ICD-10-CM | POA: Insufficient documentation

## 2015-10-28 DIAGNOSIS — Z800 Family history of malignant neoplasm of digestive organs: Secondary | ICD-10-CM | POA: Insufficient documentation

## 2015-10-28 DIAGNOSIS — E119 Type 2 diabetes mellitus without complications: Secondary | ICD-10-CM | POA: Insufficient documentation

## 2015-10-28 DIAGNOSIS — Z7984 Long term (current) use of oral hypoglycemic drugs: Secondary | ICD-10-CM | POA: Insufficient documentation

## 2015-10-28 DIAGNOSIS — Z853 Personal history of malignant neoplasm of breast: Secondary | ICD-10-CM | POA: Insufficient documentation

## 2015-10-28 DIAGNOSIS — Z79899 Other long term (current) drug therapy: Secondary | ICD-10-CM | POA: Insufficient documentation

## 2015-10-28 DIAGNOSIS — Z90710 Acquired absence of both cervix and uterus: Secondary | ICD-10-CM | POA: Insufficient documentation

## 2015-10-28 DIAGNOSIS — C50212 Malignant neoplasm of upper-inner quadrant of left female breast: Secondary | ICD-10-CM

## 2015-10-28 DIAGNOSIS — D0512 Intraductal carcinoma in situ of left breast: Secondary | ICD-10-CM | POA: Diagnosis not present

## 2015-10-28 DIAGNOSIS — Z803 Family history of malignant neoplasm of breast: Secondary | ICD-10-CM | POA: Insufficient documentation

## 2015-10-28 DIAGNOSIS — Z808 Family history of malignant neoplasm of other organs or systems: Secondary | ICD-10-CM | POA: Insufficient documentation

## 2015-10-28 HISTORY — PX: MASTECTOMY W/ SENTINEL NODE BIOPSY: SHX2001

## 2015-10-28 LAB — GLUCOSE, CAPILLARY
GLUCOSE-CAPILLARY: 183 mg/dL — AB (ref 65–99)
Glucose-Capillary: 115 mg/dL — ABNORMAL HIGH (ref 65–99)

## 2015-10-28 SURGERY — MASTECTOMY WITH SENTINEL LYMPH NODE BIOPSY
Anesthesia: General | Site: Breast | Laterality: Left

## 2015-10-28 MED ORDER — FLUTICASONE PROPIONATE 50 MCG/ACT NA SUSP: 2.0000 | Freq: Every day | NASAL | Status: DC

## 2015-10-28 MED ORDER — CEFAZOLIN SODIUM-DEXTROSE 2-3 GM-% IV SOLR
2.0000 g | INTRAVENOUS | Status: AC
  Administered 2015-10-28: 2 g via INTRAVENOUS

## 2015-10-28 MED ORDER — METHOCARBAMOL 500 MG PO TABS: 500.0000 mg | ORAL_TABLET | Freq: Four times a day (QID) | ORAL | Status: DC | PRN

## 2015-10-28 MED ORDER — TECHNETIUM TC 99M SULFUR COLLOID FILTERED: 1.0000 | Freq: Once | INTRAVENOUS | Status: DC | PRN

## 2015-10-28 MED ORDER — LACTATED RINGERS IV SOLN
INTRAVENOUS | Status: DC
  Administered 2015-10-28 (×2): via INTRAVENOUS

## 2015-10-28 MED ORDER — NABUMETONE 500 MG PO TABS: 500.0000 mg | ORAL_TABLET | Freq: Two times a day (BID) | ORAL | Status: DC | PRN

## 2015-10-28 MED ORDER — OXYBUTYNIN CHLORIDE 5 MG PO TABS: 5.0000 mg | ORAL_TABLET | Freq: Four times a day (QID) | ORAL | Status: DC | PRN

## 2015-10-28 MED ORDER — SIMETHICONE 80 MG PO CHEW: 40.0000 mg | CHEWABLE_TABLET | Freq: Four times a day (QID) | ORAL | Status: DC | PRN

## 2015-10-28 MED ORDER — HYDROCHLOROTHIAZIDE 25 MG PO TABS: 12.5000 mg | ORAL_TABLET | Freq: Every day | ORAL | Status: DC

## 2015-10-28 MED ORDER — FENTANYL CITRATE (PF) 100 MCG/2ML IJ SOLN
INTRAMUSCULAR | Status: DC | PRN
  Administered 2015-10-28 (×2): 50 ug via INTRAVENOUS

## 2015-10-28 MED ORDER — PROPOFOL 10 MG/ML IV BOLUS
INTRAVENOUS | Status: DC | PRN
  Administered 2015-10-28: 50 mg via INTRAVENOUS
  Administered 2015-10-28: 150 mg via INTRAVENOUS
  Administered 2015-10-28: 50 mg via INTRAVENOUS

## 2015-10-28 MED ORDER — SODIUM CHLORIDE 0.9 % IV SOLN
INTRAVENOUS | Status: DC | PRN
  Administered 2015-10-28: 90 mL

## 2015-10-28 MED ORDER — LOSARTAN POTASSIUM 50 MG PO TABS: 50.0000 mg | ORAL_TABLET | Freq: Every day | ORAL | Status: DC

## 2015-10-28 MED ORDER — LIDOCAINE HCL (CARDIAC) 20 MG/ML IV SOLN
INTRAVENOUS | Status: DC | PRN
  Administered 2015-10-28: 50 mg via INTRAVENOUS

## 2015-10-28 MED ORDER — ONDANSETRON HCL 4 MG/2ML IJ SOLN
INTRAMUSCULAR | Status: AC
  Filled 2015-10-28: qty 2

## 2015-10-28 MED ORDER — SUCCINYLCHOLINE CHLORIDE 20 MG/ML IJ SOLN
INTRAMUSCULAR | Status: AC
  Filled 2015-10-28: qty 1

## 2015-10-28 MED ORDER — LIDOCAINE HCL (CARDIAC) 20 MG/ML IV SOLN
INTRAVENOUS | Status: AC
  Filled 2015-10-28: qty 5

## 2015-10-28 MED ORDER — HYDROMORPHONE HCL 1 MG/ML IJ SOLN
0.2500 mg | INTRAMUSCULAR | Status: DC | PRN
  Administered 2015-10-28 (×2): 0.5 mg via INTRAVENOUS

## 2015-10-28 MED ORDER — CEFAZOLIN SODIUM-DEXTROSE 2-3 GM-% IV SOLR
2.0000 g | Freq: Three times a day (TID) | INTRAVENOUS | Status: AC
  Administered 2015-10-29: 2 g via INTRAVENOUS
  Filled 2015-10-28: qty 50

## 2015-10-28 MED ORDER — FENTANYL CITRATE (PF) 100 MCG/2ML IJ SOLN
50.0000 ug | INTRAMUSCULAR | Status: DC | PRN
  Administered 2015-10-28: 100 ug via INTRAVENOUS

## 2015-10-28 MED ORDER — DEXAMETHASONE SODIUM PHOSPHATE 10 MG/ML IJ SOLN
INTRAMUSCULAR | Status: AC
  Filled 2015-10-28: qty 1

## 2015-10-28 MED ORDER — MIDAZOLAM HCL 2 MG/2ML IJ SOLN
INTRAMUSCULAR | Status: AC
  Filled 2015-10-28: qty 2

## 2015-10-28 MED ORDER — BUPIVACAINE-EPINEPHRINE (PF) 0.25% -1:200000 IJ SOLN
INTRAMUSCULAR | Status: AC
  Filled 2015-10-28: qty 30

## 2015-10-28 MED ORDER — GLYCOPYRROLATE 0.2 MG/ML IJ SOLN: 0.2000 mg | Freq: Once | INTRAMUSCULAR | Status: DC | PRN

## 2015-10-28 MED ORDER — PHENYLEPHRINE HCL 10 MG/ML IJ SOLN
INTRAMUSCULAR | Status: AC
  Filled 2015-10-28: qty 1

## 2015-10-28 MED ORDER — PIOGLITAZONE HCL 30 MG PO TABS
30.0000 mg | ORAL_TABLET | Freq: Every day | ORAL | Status: DC
Start: 2015-10-28 — End: 2015-10-29

## 2015-10-28 MED ORDER — ACETAMINOPHEN 325 MG PO TABS: 650.0000 mg | ORAL_TABLET | Freq: Four times a day (QID) | ORAL | Status: DC | PRN

## 2015-10-28 MED ORDER — OXYCODONE HCL 5 MG PO TABS
5.0000 mg | ORAL_TABLET | ORAL | Status: DC | PRN
  Administered 2015-10-28: 5 mg via ORAL
  Administered 2015-10-29 (×2): 10 mg via ORAL
  Filled 2015-10-28 (×3): qty 2

## 2015-10-28 MED ORDER — ACETAMINOPHEN 650 MG RE SUPP: 650.0000 mg | Freq: Four times a day (QID) | RECTAL | Status: DC | PRN

## 2015-10-28 MED ORDER — CEFAZOLIN SODIUM-DEXTROSE 2-3 GM-% IV SOLR
INTRAVENOUS | Status: AC
  Filled 2015-10-28: qty 50

## 2015-10-28 MED ORDER — FENTANYL CITRATE (PF) 100 MCG/2ML IJ SOLN
INTRAMUSCULAR | Status: AC
  Filled 2015-10-28: qty 2

## 2015-10-28 MED ORDER — ATROPINE SULFATE 0.4 MG/ML IJ SOLN
INTRAMUSCULAR | Status: AC
  Filled 2015-10-28: qty 1

## 2015-10-28 MED ORDER — ONDANSETRON HCL 4 MG/2ML IJ SOLN: 4.0000 mg | Freq: Once | INTRAMUSCULAR | Status: DC | PRN

## 2015-10-28 MED ORDER — PROPOFOL 10 MG/ML IV BOLUS
INTRAVENOUS | Status: AC
  Filled 2015-10-28: qty 40

## 2015-10-28 MED ORDER — 0.9 % SODIUM CHLORIDE (POUR BTL) OPTIME
TOPICAL | Status: DC | PRN
  Administered 2015-10-28: 400 mL

## 2015-10-28 MED ORDER — SCOPOLAMINE 1 MG/3DAYS TD PT72: 1.0000 | MEDICATED_PATCH | Freq: Once | TRANSDERMAL | Status: DC | PRN

## 2015-10-28 MED ORDER — DIPHENHYDRAMINE HCL 12.5 MG/5ML PO ELIX: 12.5000 mg | ORAL_SOLUTION | Freq: Four times a day (QID) | ORAL | Status: DC | PRN

## 2015-10-28 MED ORDER — DIPHENHYDRAMINE HCL 50 MG/ML IJ SOLN: 12.5000 mg | Freq: Four times a day (QID) | INTRAMUSCULAR | Status: DC | PRN

## 2015-10-28 MED ORDER — HYDROMORPHONE HCL 1 MG/ML IJ SOLN
INTRAMUSCULAR | Status: AC
  Filled 2015-10-28: qty 1

## 2015-10-28 MED ORDER — MORPHINE SULFATE (PF) 2 MG/ML IV SOLN: 1.0000 mg | INTRAVENOUS | Status: DC | PRN

## 2015-10-28 MED ORDER — ONDANSETRON HCL 4 MG/2ML IJ SOLN
INTRAMUSCULAR | Status: DC | PRN
  Administered 2015-10-28: 4 mg via INTRAVENOUS

## 2015-10-28 MED ORDER — HYDRALAZINE HCL 20 MG/ML IJ SOLN: 10.0000 mg | INTRAMUSCULAR | Status: DC | PRN

## 2015-10-28 MED ORDER — EPHEDRINE SULFATE 50 MG/ML IJ SOLN
INTRAMUSCULAR | Status: AC
  Filled 2015-10-28: qty 2

## 2015-10-28 MED ORDER — ONDANSETRON HCL 4 MG/2ML IJ SOLN: 4.0000 mg | Freq: Four times a day (QID) | INTRAMUSCULAR | Status: DC | PRN

## 2015-10-28 MED ORDER — DEXAMETHASONE SODIUM PHOSPHATE 4 MG/ML IJ SOLN
INTRAMUSCULAR | Status: DC | PRN
  Administered 2015-10-28: 10 mg via INTRAVENOUS

## 2015-10-28 MED ORDER — MEPERIDINE HCL 25 MG/ML IJ SOLN: 6.2500 mg | INTRAMUSCULAR | Status: DC | PRN

## 2015-10-28 MED ORDER — PHENOL 1.4 % MT LIQD
2.0000 | OROMUCOSAL | Status: DC | PRN
  Administered 2015-10-28: 2 via OROMUCOSAL
  Filled 2015-10-28: qty 177

## 2015-10-28 MED ORDER — METOPROLOL SUCCINATE ER 25 MG PO TB24: 25.0000 mg | ORAL_TABLET | Freq: Every day | ORAL | Status: DC

## 2015-10-28 MED ORDER — INSULIN ASPART 100 UNIT/ML ~~LOC~~ SOLN
1.0000 [IU] | Freq: Three times a day (TID) | SUBCUTANEOUS | Status: DC
  Filled 2015-10-28: qty 1

## 2015-10-28 MED ORDER — MIDAZOLAM HCL 5 MG/5ML IJ SOLN
INTRAMUSCULAR | Status: DC | PRN
  Administered 2015-10-28: 2 mg via INTRAVENOUS

## 2015-10-28 MED ORDER — MIDAZOLAM HCL 2 MG/2ML IJ SOLN
1.0000 mg | INTRAMUSCULAR | Status: DC | PRN
  Administered 2015-10-28: 2 mg via INTRAVENOUS

## 2015-10-28 MED ORDER — KCL IN DEXTROSE-NACL 20-5-0.45 MEQ/L-%-% IV SOLN
INTRAVENOUS | Status: AC
  Administered 2015-10-28: 19:00:00 via INTRAVENOUS
  Filled 2015-10-28: qty 1000

## 2015-10-28 MED ORDER — GLYCOPYRROLATE 0.2 MG/ML IJ SOLN
INTRAMUSCULAR | Status: AC
  Filled 2015-10-28: qty 1

## 2015-10-28 MED ORDER — BUPIVACAINE-EPINEPHRINE (PF) 0.5% -1:200000 IJ SOLN
INTRAMUSCULAR | Status: DC | PRN
  Administered 2015-10-28: 30 mL

## 2015-10-28 MED ORDER — ONDANSETRON 4 MG PO TBDP: 4.0000 mg | ORAL_TABLET | Freq: Four times a day (QID) | ORAL | Status: DC | PRN

## 2015-10-28 SURGICAL SUPPLY — 75 items
BAG DECANTER FOR FLEXI CONT (MISCELLANEOUS) ×3 IMPLANT
BINDER BREAST 3XL (BIND) ×3 IMPLANT
BINDER BREAST LRG (GAUZE/BANDAGES/DRESSINGS) IMPLANT
BINDER BREAST MEDIUM (GAUZE/BANDAGES/DRESSINGS) IMPLANT
BINDER BREAST XLRG (GAUZE/BANDAGES/DRESSINGS) IMPLANT
BINDER BREAST XXLRG (GAUZE/BANDAGES/DRESSINGS) IMPLANT
BLADE HEX COATED 2.75 (ELECTRODE) ×3 IMPLANT
BLADE SURG 10 STRL SS (BLADE) ×3 IMPLANT
BLADE SURG 15 STRL LF DISP TIS (BLADE) ×1 IMPLANT
BLADE SURG 15 STRL SS (BLADE) ×2
BNDG COHESIVE 4X5 TAN STRL (GAUZE/BANDAGES/DRESSINGS) ×3 IMPLANT
CANISTER SUCT 1200ML W/VALVE (MISCELLANEOUS) ×3 IMPLANT
CHLORAPREP W/TINT 26ML (MISCELLANEOUS) ×3 IMPLANT
CLIP TI LARGE 6 (CLIP) IMPLANT
CLIP TI MEDIUM 6 (CLIP) ×9 IMPLANT
CLIP TI WIDE RED SMALL 6 (CLIP) IMPLANT
CLOSURE WOUND 1/2 X4 (GAUZE/BANDAGES/DRESSINGS) ×2
COVER MAYO STAND STRL (DRAPES) ×3 IMPLANT
COVER PROBE W GEL 5X96 (DRAPES) ×3 IMPLANT
DECANTER SPIKE VIAL GLASS SM (MISCELLANEOUS) IMPLANT
DEVICE DISSECT PLASMABLAD 3.0S (MISCELLANEOUS) ×1 IMPLANT
DRAIN CHANNEL 19F RND (DRAIN) ×3 IMPLANT
DRAPE UTILITY XL STRL (DRAPES) ×6 IMPLANT
DRSG PAD ABDOMINAL 8X10 ST (GAUZE/BANDAGES/DRESSINGS) ×6 IMPLANT
ELECT REM PT RETURN 9FT ADLT (ELECTROSURGICAL) ×3
ELECTRODE REM PT RTRN 9FT ADLT (ELECTROSURGICAL) ×1 IMPLANT
EVACUATOR SILICONE 100CC (DRAIN) ×3 IMPLANT
GAUZE SPONGE 4X4 12PLY STRL (GAUZE/BANDAGES/DRESSINGS) ×3 IMPLANT
GLOVE BIO SURGEON STRL SZ 6 (GLOVE) ×3 IMPLANT
GLOVE BIOGEL M STRL SZ7.5 (GLOVE) ×3 IMPLANT
GLOVE BIOGEL PI IND STRL 6.5 (GLOVE) ×1 IMPLANT
GLOVE BIOGEL PI IND STRL 7.0 (GLOVE) ×1 IMPLANT
GLOVE BIOGEL PI IND STRL 8 (GLOVE) ×1 IMPLANT
GLOVE BIOGEL PI INDICATOR 6.5 (GLOVE) ×2
GLOVE BIOGEL PI INDICATOR 7.0 (GLOVE) ×2
GLOVE BIOGEL PI INDICATOR 8 (GLOVE) ×2
GOWN STRL REUS W/ TWL LRG LVL3 (GOWN DISPOSABLE) IMPLANT
GOWN STRL REUS W/ TWL XL LVL3 (GOWN DISPOSABLE) ×1 IMPLANT
GOWN STRL REUS W/TWL 2XL LVL3 (GOWN DISPOSABLE) ×3 IMPLANT
GOWN STRL REUS W/TWL LRG LVL3 (GOWN DISPOSABLE)
GOWN STRL REUS W/TWL XL LVL3 (GOWN DISPOSABLE) ×2
ILLUMINATOR WAVEGUIDE N/F (MISCELLANEOUS) IMPLANT
LIGHT WAVEGUIDE WIDE FLAT (MISCELLANEOUS) ×3 IMPLANT
LIQUID BAND (GAUZE/BANDAGES/DRESSINGS) ×3 IMPLANT
NDL SAFETY ECLIPSE 18X1.5 (NEEDLE) ×1 IMPLANT
NEEDLE HYPO 18GX1.5 SHARP (NEEDLE) ×2
NEEDLE HYPO 25X1 1.5 SAFETY (NEEDLE) ×3 IMPLANT
NEEDLE SPNL 18GX3.5 QUINCKE PK (NEEDLE) IMPLANT
NEEDLE SPNL 22GX3.5 QUINCKE BK (NEEDLE) ×3 IMPLANT
NS IRRIG 1000ML POUR BTL (IV SOLUTION) ×3 IMPLANT
PACK BASIN DAY SURGERY FS (CUSTOM PROCEDURE TRAY) ×3 IMPLANT
PACK UNIVERSAL I (CUSTOM PROCEDURE TRAY) ×3 IMPLANT
PENCIL BUTTON HOLSTER BLD 10FT (ELECTRODE) ×3 IMPLANT
PIN SAFETY STERILE (MISCELLANEOUS) ×3 IMPLANT
PLASMABLADE 3.0S (MISCELLANEOUS) ×3
SLEEVE SCD COMPRESS KNEE MED (MISCELLANEOUS) ×3 IMPLANT
SPONGE LAP 18X18 X RAY DECT (DISPOSABLE) ×3 IMPLANT
STAPLER VISISTAT 35W (STAPLE) IMPLANT
STOCKINETTE IMPERVIOUS LG (DRAPES) ×3 IMPLANT
STRIP CLOSURE SKIN 1/2X4 (GAUZE/BANDAGES/DRESSINGS) ×4 IMPLANT
SUT ETHILON 2 0 FS 18 (SUTURE) ×3 IMPLANT
SUT MNCRL AB 4-0 PS2 18 (SUTURE) ×6 IMPLANT
SUT SILK 0 TIES 10X30 (SUTURE) ×3 IMPLANT
SUT SILK 2 0 SH (SUTURE) ×3 IMPLANT
SUT VICRYL 3-0 CR8 SH (SUTURE) ×12 IMPLANT
SUT VICRYL AB 2 0 TIE (SUTURE) ×1 IMPLANT
SUT VICRYL AB 2 0 TIES (SUTURE) ×2
SYR 50ML LL SCALE MARK (SYRINGE) ×3 IMPLANT
SYR CONTROL 10ML LL (SYRINGE) ×3 IMPLANT
TOWEL OR 17X24 6PK STRL BLUE (TOWEL DISPOSABLE) ×6 IMPLANT
TOWEL OR NON WOVEN STRL DISP B (DISPOSABLE) ×3 IMPLANT
TUBE CONNECTING 20'X1/4 (TUBING) ×1
TUBE CONNECTING 20X1/4 (TUBING) ×2 IMPLANT
UNDERPAD 30X30 (UNDERPADS AND DIAPERS) ×3 IMPLANT
YANKAUER SUCT BULB TIP NO VENT (SUCTIONS) ×3 IMPLANT

## 2015-10-28 NOTE — Anesthesia Preprocedure Evaluation (Signed)
Anesthesia Evaluation  Patient identified by MRN, date of birth, ID band Patient awake    Airway Mallampati: I  TM Distance: >3 FB Neck ROM: Full    Dental   Pulmonary    Pulmonary exam normal        Cardiovascular hypertension, Pt. on medications Normal cardiovascular exam     Neuro/Psych    GI/Hepatic   Endo/Other  diabetes, Type 2, Oral Hypoglycemic Agents  Renal/GU      Musculoskeletal   Abdominal   Peds  Hematology   Anesthesia Other Findings   Reproductive/Obstetrics                             Anesthesia Physical Anesthesia Plan  ASA: II  Anesthesia Plan: General   Post-op Pain Management: MAC Combined w/ Regional for Post-op pain   Induction: Intravenous  Airway Management Planned: LMA  Additional Equipment:   Intra-op Plan:   Post-operative Plan: Extubation in OR  Informed Consent: I have reviewed the patients History and Physical, chart, labs and discussed the procedure including the risks, benefits and alternatives for the proposed anesthesia with the patient or authorized representative who has indicated his/her understanding and acceptance.     Plan Discussed with: CRNA and Surgeon  Anesthesia Plan Comments:         Anesthesia Quick Evaluation

## 2015-10-28 NOTE — Discharge Instructions (Addendum)
[facility] °[phone] ° °MASTECTOMY: POST OP INSTRUCTIONS ° °Always review your discharge instruction sheet given to you by the facility where your surgery was performed. °IF YOU HAVE DISABILITY OR FAMILY LEAVE FORMS, YOU MUST BRING THEM TO THE OFFICE FOR PROCESSING.   °DO NOT GIVE THEM TO YOUR DOCTOR. °A prescription for pain medication may be given to you upon discharge.  Take your pain medication as prescribed, if needed.  If narcotic pain medicine is not needed, then you may take acetaminophen (Tylenol) or ibuprofen (Advil) as needed. °1. Take your usually prescribed medications unless otherwise directed. °2. If you need a refill on your pain medication, please contact your pharmacy.  They will contact our office to request authorization.  Prescriptions will not be filled after 5pm or on week-ends. °3. You should follow a light diet the first few days after arrival home, such as soup and crackers, etc.  Resume your normal diet the day after surgery. °4. Most patients will experience some swelling and bruising on the chest and underarm.  Ice packs will help.  Swelling and bruising can take several days to resolve.  °5. It is common to experience some constipation if taking pain medication after surgery.  Increasing fluid intake and taking a stool softener (such as Colace) will usually help or prevent this problem from occurring.  A mild laxative (Milk of Magnesia or Miralax) should be taken according to package instructions if there are no bowel movements after 48 hours. °6. Unless discharge instructions indicate otherwise, leave your bandage dry and in place until your next appointment in 3-5 days.  You may take a limited sponge bath.  No tube baths or showers until the drains are removed.  You may have steri-strips (small skin tapes) in place directly over the incision.  These strips should be left on the skin for 7-10 days.  If your surgeon used skin glue on the incision, you may  shower in 24 hours.  The glue will flake off over the next 2-3 weeks.  Any sutures or staples will be removed at the office during your follow-up visit. °7. DRAINS:  If you have drains in place, it is important to keep a list of the amount of drainage produced each day in your drains.  Before leaving the hospital, you should be instructed on drain care.  Call our office if you have any questions about your drains. °8. ACTIVITIES:  You may resume regular (light) daily activities beginning the next day--such as daily self-care, walking, climbing stairs--gradually increasing activities as tolerated.  You may have sexual intercourse when it is comfortable.  Refrain from any heavy lifting or straining until approved by your doctor. °a. You may drive when you are no longer taking prescription pain medication, you can comfortably wear a seatbelt, and you can safely maneuver your car and apply brakes. °b. RETURN TO WORK:  __________________________________________________________ °9. You should see your doctor in the office for a follow-up appointment approximately 3-5 days after your surgery.  Your doctor’s nurse will typically make your follow-up appointment when she calls you with your pathology report.  Expect your pathology report 2-3 business days after your surgery.  You may call to check if you do not hear from us after three days.   °10. OTHER INSTRUCTIONS: ______________________________________________________________________________________________ ____________________________________________________________________________________________ ° °WHEN TO CALL YOUR DOCTOR: °1. Fever over 101.0 °2. Nausea and/or vomiting °3. Extreme swelling or bruising °4. Continued bleeding from incision. °5. Increased pain, redness, or drainage from the   incision. ° °The clinic staff is available to answer your questions during regular business hours.  Please don’t hesitate to call and ask to speak to one of the nurses for clinical  concerns.  If you have a medical emergency, go to the nearest emergency room or call 911.  A surgeon from [facility] is always on call at the hospital. °[address], [address], [place], [place]  [address] ? [address] [address], [place], [place]   [address] °[phone] ? [phone] ? FAX [phone] °Web site: www.[facility] ° °About my Jackson-Pratt Bulb Drain ° °What is a Jackson-Pratt bulb? °A Jackson-Pratt is a soft, round device used to collect drainage. It is connected to a long, thin drainage catheter, which is held in place by one or two small stiches near your surgical incision site. When the bulb is squeezed, it forms a vacuum, forcing the drainage to empty into the bulb. ° °Emptying the Jackson-Pratt bulb- °To empty the bulb: °1. Release the plug on the top of the bulb. °2. Pour the bulb's contents into a measuring container which your nurse will provide. °3. Record the time emptied and amount of drainage. Empty the drain(s) as often as your     doctor or nurse recommends. ° °Date                  Time                    Amount (Drain 1)                 Amount (Drain 2) ° °_____________________________________________________________________ ° °_____________________________________________________________________ ° °_____________________________________________________________________ ° °_____________________________________________________________________ ° °_____________________________________________________________________ ° °_____________________________________________________________________ ° °_____________________________________________________________________ ° °_____________________________________________________________________ ° °Squeezing the Jackson-Pratt Bulb- °To squeeze the bulb: °1. Make sure the plug at the top of the bulb is open. °2. Squeeze the bulb tightly in your fist. You will hear air squeezing from the bulb. °3. Replace the plug while the bulb is squeezed. °4.  Use a safety pin to attach the bulb to your clothing. This will keep the catheter from     pulling at the bulb insertion site. ° °When to call your doctor- °Call your doctor if: °· Drain site becomes red, swollen or hot. °· You have a fever greater than 101 degrees F. °· There is oozing at the drain site. °· Drain falls out (apply a guaze bandage over the drain hole and secure it with tape). °· Drainage increases daily not related to activity patterns. (You will usually have more drainage when you are active than when you are resting.) °· Drainage has a bad odor. ° °

## 2015-10-28 NOTE — Progress Notes (Signed)
Assisted Dr. Conrad  with left, ultrasound guided, pectoralis block and nuc med inj . Side rails up, monitors on throughout procedure. See vital signs in flow sheet. Tolerated Procedure well.

## 2015-10-28 NOTE — Anesthesia Postprocedure Evaluation (Signed)
Anesthesia Post Note  Patient: Kelly Bell  Procedure(s) Performed: Procedure(s) (LRB): LEFT MASTECTOMY WITH SENTINEL LYMPH NODE BIOPSY (Left)  Patient location during evaluation: PACU Anesthesia Type: General Level of consciousness: awake and alert Pain management: pain level controlled Vital Signs Assessment: post-procedure vital signs reviewed and stable Respiratory status: spontaneous breathing, nonlabored ventilation, respiratory function stable and patient connected to nasal cannula oxygen Cardiovascular status: blood pressure returned to baseline and stable Postop Assessment: no signs of nausea or vomiting Anesthetic complications: no    Last Vitals:  Filed Vitals:   10/28/15 1550 10/28/15 1600  BP: 164/69 173/71  Pulse: 58 73  Temp:    Resp: 13 12    Last Pain: There were no vitals filed for this visit.               Zenaida Deed

## 2015-10-28 NOTE — Transfer of Care (Signed)
Immediate Anesthesia Transfer of Care Note  Patient: Kelly Bell  Procedure(s) Performed: Procedure(s): LEFT MASTECTOMY WITH SENTINEL LYMPH NODE BIOPSY (Left)  Patient Location: PACU  Anesthesia Type:GA combined with regional for post-op pain  Level of Consciousness: awake and patient cooperative  Airway & Oxygen Therapy: Patient Spontanous Breathing and Patient connected to face mask oxygen  Post-op Assessment: Report given to RN and Post -op Vital signs reviewed and stable  Post vital signs: Reviewed and stable  Last Vitals:  Filed Vitals:   10/28/15 1550 10/28/15 1600  BP: 164/69 173/71  Pulse: 58 73  Temp:    Resp: 13 12    Complications: No apparent anesthesia complications

## 2015-10-28 NOTE — Interval H&P Note (Signed)
History and Physical Interval Note:  10/28/2015 3:23 PM  Kelly Bell  has presented today for surgery, with the diagnosis of LEFT BREAST CANCER.  The various methods of treatment have been discussed with the patient and family. After consideration of risks, benefits and other options for treatment, the patient has consented to  Procedure(s): LEFT MASTECTOMY WITH SENTINEL LYMPH NODE BIOPSY (Left) as a surgical intervention .  The patient's history has been reviewed, patient examined, no change in status, stable for surgery.  I have reviewed the patient's chart and labs.  Questions were answered to the patient's satisfaction.     Abimael Zeiter

## 2015-10-28 NOTE — H&P (Signed)
Kelly Bell Location: [facility] Patient #: [id number] DOB: [birth year] Undefined / Language: Cleophus Molt / Race: Black or African American Female   History of Present Illness  The patient is a 74 year old female who presents with breast cancer. Pt is a 74 yo F who is referred by Debbrah Alar for new diagnoiss of right left breast cancer. She had screning detected calcifications that were concerning in the upper inner quadrant. Diagnostic mammography showed that this area of calcifications was approximately 4 cm in total diameter. Hormone receptors were positive. She had breast density category C.   The patient had a prior right breast cancer in 1987. This was treated with lumpectomy and radiation. She has multiple relatives wtih cancer. Breast, pancreas, and oral cancer. Menarche was age 48. Last period was 28 with hysterectomy. She did not use any HRT. She had 2 kids with the first at age 51. She is up to date with her colonoscopy, bone denstiy study.   She is retired. She has never smoked. She does not use alcohol or illicit drugs.    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 Breast Mass; Local Excision Right. Colon Polyp Removal - Colonoscopy Foot Surgery Right. Hysterectomy (not due to cancer) - Partial Oral Surgery Sentinel Lymph Node Biopsy Shoulder Surgery Left.  Diagnostic Studies History Colonoscopy 1-5 years ago Mammogram within last year Pap Smear >5 years ago  Medication History No Current Medications Medications Reconciled  Social History Alcohol use Occasional alcohol use. Caffeine use Carbonated beverages, Coffee, Tea. No drug  use Tobacco use Never smoker.  Family History Arthritis Family Members In General, Mother. Breast Cancer Sister. Cancer Son. Diabetes Mellitus Father, Mother. Malignant Neoplasm Of Pancreas Mother.  Pregnancy / Birth History Age at menarche 34 years. Age of menopause [age] Contraceptive History Oral contraceptives. Gravida 2 Irregular periods Maternal age 92-35 Para 2    Review of Systems General Not Present- Appetite Loss, Chills, Fatigue, Fever, Night Sweats, Weight Gain and Weight Loss. Skin Not Present- Change in Wart/Mole, Dryness, Hives, Jaundice, New Lesions, Non-Healing Wounds, Rash and Ulcer. HEENT Present- Seasonal Allergies and Wears glasses/contact lenses. Not Present- Earache, Hearing Loss, Hoarseness, Nose Bleed, Oral Ulcers, Ringing in the Ears, Sinus Pain, Sore Throat, Visual Disturbances and Yellow Eyes. Respiratory Not Present- Bloody sputum, Chronic Cough, Difficulty Breathing, Snoring and Wheezing. Breast Not Present- Breast Mass, Breast Pain, Nipple Discharge and Skin Changes. Cardiovascular Present- Swelling of Extremities. Not Present- Chest Pain, Difficulty Breathing Lying Down, Leg Cramps, Palpitations, Rapid Heart Rate and Shortness of Breath. Gastrointestinal Not Present- Abdominal Pain, Bloating, Bloody Stool, Change in Bowel Habits, Chronic diarrhea, Constipation, Difficulty Swallowing, Excessive gas, Gets full quickly at meals, Hemorrhoids, Indigestion, Nausea, Rectal Pain and Vomiting. Female Genitourinary Present- Urgency. Not Present- Frequency, Nocturia, Painful Urination and Pelvic Pain. Musculoskeletal Not Present- Back Pain, Joint Pain, Joint Stiffness, Muscle Pain, Muscle Weakness and Swelling of Extremities. Neurological Not Present- Decreased Memory, Fainting, Headaches, Numbness, Seizures, Tingling, Tremor, Trouble walking and Weakness. Psychiatric Not Present- Anxiety, Bipolar, Change in Sleep Pattern, Depression, Fearful and  Frequent crying. Endocrine Present- Hair Changes and Hot flashes. Not Present- Cold Intolerance, Excessive Hunger, Heat Intolerance and New Diabetes. Hematology Present- Easy Bruising. Not Present- Excessive bleeding, Gland problems, HIV and Persistent Infections.  Physical Exam  General Mental Status-Alert. General Appearance-Consistent with stated age. Hydration-Well hydrated. Voice-Normal.  Head and Neck Head-normocephalic, atraumatic with no lesions or palpable masses. Trachea-midline. Thyroid Gland Characteristics - normal size and consistency.  Eye Eyeball - Bilateral-Extraocular movements intact. Sclera/Conjunctiva - Bilateral-No scleral icterus.  Chest and Lung Exam Chest and lung exam reveals -quiet, even and easy respiratory effort with no use of accessory muscles and on auscultation, normal breath sounds, no adventitious sounds and normal vocal resonance. Inspection Chest Wall - Normal. Back - normal.  Breast Note: no palpable mass or skin dimpling. No LAD. No nipple discharge. Some asymmetry due to prior lumpectomy on right.   Cardiovascular Cardiovascular examination reveals -normal heart sounds, regular rate and rhythm with no murmurs and normal pedal pulses bilaterally.  Abdomen Inspection Inspection of the abdomen reveals - No Hernias. Palpation/Percussion Palpation and Percussion of the abdomen reveal - Soft, Non Tender, No Rebound tenderness, No Rigidity (guarding) and No hepatosplenomegaly. Auscultation Auscultation of the abdomen reveals - Bowel sounds normal.  Neurologic Neurologic evaluation reveals -alert and oriented x 3 with no impairment of recent or remote memory. Mental Status-Normal.  Musculoskeletal Global Assessment -Note: no gross deformities.  Normal Exam - Left-Upper Extremity Strength Normal and Lower Extremity Strength Normal. Normal Exam - Right-Upper Extremity Strength Normal and Lower Extremity  Strength Normal.  Lymphatic Head & Neck  General Head & Neck Lymphatics: Bilateral - Description - Normal. Axillary  General Axillary Region: Bilateral - Description - Normal. Tenderness - Non Tender. Femoral & Inguinal  Generalized Femoral & Inguinal Lymphatics: Bilateral - Description - No Generalized lymphadenopathy.    Assessment & Plan PRIMARY CANCER OF UPPER INNER QUADRANT OF LEFT FEMALE BREAST (C50.212) Impression: Pt has a new diagnosis of Tis left breast cancer. After MRI, we will make a final plan. We would include the penuts character. She would like to pursue breast conservation if at all possible. I would like to make sure she does not have a more extensive lesion that we think. Even if we have to do a larger lumpectomy, she is large breasted, and would actually be more symmetric given her prior surgery.  45 min spent in evaluation, examination, counseling, and coordination of care. >50% spent in counseling. Current Plans Pt Education - flb breast cancer surgery: discussed with patient and provided information. You are being scheduled for surgery - Our schedulers will call you.  You should hear from our office's scheduling department within 5 working days about the location, date, and time of surgery. We try to make accommodations for patient's preferences in scheduling surgery, but sometimes the OR schedule or the surgeon's schedule prevents [place] from making those accommodations.  If you have not heard from our office [phone]) in 5 working days, call the office and ask for your surgeon's nurse.  If you have other questions about your diagnosis, plan, or surgery, call the office and ask for your surgeon's nurse.  Pt Education - [facility] Free Text Education/Instructions: discussed with patient and provided information.   Signed by Stark Klein, MD

## 2015-10-28 NOTE — Op Note (Addendum)
Left Mastectomy with Sentinel Node Biopsy   Indications: This patient presents with history of left breast cancer with clinically negative axillary lymph node exam.  Pre-operative Diagnosis: right breast cancer, cTis, 10 cm  Post-operative Diagnosis: right breast cancer, same  Surgeon: Stark Klein   Anesthesia: General endotracheal anesthesia and Local anesthesia 0.25.% bupivacaine, with epinephrine  ASA Class: 2  Procedure Details  The patient was seen in the Holding Room. The risks, benefits, complications, treatment options, and expected outcomes were discussed with the patient. The possibilities of reaction to medication, pulmonary aspiration, bleeding, infection, the need for additional procedures, failure to diagnose a condition, and creating a complication requiring transfusion or operation were discussed with the patient. The patient concurred with the proposed plan, giving informed consent.  The site of surgery properly noted/marked. The patient was taken to Operating Room # 2, identified as [facility] and the procedure verified as Left Mastectomy and Sentinel Node Biopsy. A Time Out was held and the above information confirmed.    After induction of anesthesia, the left arm, breast, and chest were prepped and draped in standard fashion.   The borders of the breast were identified and marked.  The incisions of the breast was drawn out to make sure incision lines were equidistant in length.  The superior incision was made with the #10 blade.  The saline mixture was infiltrated into the superior flap.  Mastectomy hooks were used to provide elevation of the skin edges, and the cautery was used to create the mastectomy flaps.  The dissection was taken to the fascia of the pectoralis major.  The penetrating vessels were clipped.  The superior flap was taken medially to the lateral sternal border, superiorly to the inferior border of the clavicle.  The inferior flap was similarly created,  inferiorly to the inframammary fold and laterally to the border of the latissimus.  The breast was taken off including the pectoralis fascia and the axillary tail marked.    Using a hand-held gamma probe, axillary sentinel nodes were identified.  Three level 2 axillary sentinel nodes were removed and submitted to pathology.  The findings are below.  The lymphovascular channels were clipped with metal clips.        The wound was irrigated.  One 19 Blake drain was placed laterally.   Hemostasis was achieved with cautery.  The wound was irrigated and closed with a 3-0 Vicryl deep dermal interrupted sutures and 4-0 Vicryl subcuticular closure in layers.    Sterile dressings were applied. At the end of the operation, all sponge, instrument, and needle counts were correct.  Findings: grossly clear surgical margins, cps 370, 290, and 110.  Background cps 15.  Estimated Blood Loss:  less than 50 mL         Drains: 19 Fr Blake drain                Specimens: L breast and 3 axillary sentinel nodes         Complications:  None; patient tolerated the procedure well.         Disposition: PACU - hemodynamically stable.         Condition: stable

## 2015-10-28 NOTE — Anesthesia Procedure Notes (Addendum)
Anesthesia Regional Block:  Pectoralis block  Pre-Anesthetic Checklist: ,, timeout performed, Correct Patient, Correct Site, Correct Laterality, Correct Procedure, Correct Position, site marked, Risks and benefits discussed,  Surgical consent,  Pre-op evaluation,  At surgeon's request and post-op pain management  Laterality: Left  Prep: chloraprep       Needles:  Injection technique: Single-shot     Needle Length: 9cm 9 cm Needle Gauge: 21 and 21 G    Additional Needles:  Procedures: ultrasound guided (picture in chart) Pectoralis block Narrative:  Start time: 10/28/2015 3:40 PM End time: 10/28/2015 3:50 PM Injection made incrementally with aspirations every 5 mL.  Performed by: Personally  Anesthesiologist: Lillia Abed  Additional Notes: Monitors applied. Patient sedated. Sterile prep and drape,hand hygiene and sterile gloves were used. Relevant anatomy identified.Needle position confirmed.Local anesthetic injected incrementally after negative aspiration. Local anesthetic spread visualized. Vascular puncture avoided. No complications. Image printed for medical record.The patient tolerated the procedure well.       Procedure Name: LMA Insertion Date/Time: 10/28/2015 4:08 PM Performed by: Toula Moos L Pre-anesthesia Checklist: Patient identified, Emergency Drugs available, Suction available, Patient being monitored and Timeout performed Patient Re-evaluated:Patient Re-evaluated prior to inductionOxygen Delivery Method: Circle System Utilized Preoxygenation: Pre-oxygenation with 100% oxygen Intubation Type: IV induction Ventilation: Mask ventilation without difficulty LMA: LMA inserted LMA Size: 4.0 Number of attempts: 1 Airway Equipment and Method: Bite block Placement Confirmation: positive ETCO2 Tube secured with: Tape Dental Injury: Teeth and Oropharynx as per pre-operative assessment

## 2015-10-29 ENCOUNTER — Encounter (HOSPITAL_BASED_OUTPATIENT_CLINIC_OR_DEPARTMENT_OTHER): Payer: Self-pay | Admitting: General Surgery

## 2015-10-29 DIAGNOSIS — D0512 Intraductal carcinoma in situ of left breast: Secondary | ICD-10-CM | POA: Diagnosis not present

## 2015-10-29 LAB — GLUCOSE, CAPILLARY
Glucose-Capillary: 143 mg/dL — ABNORMAL HIGH (ref 65–99)
Glucose-Capillary: 243 mg/dL — ABNORMAL HIGH (ref 65–99)

## 2015-10-29 MED ORDER — OXYCODONE HCL 5 MG PO TABS: 5.0000 mg | ORAL_TABLET | ORAL | Status: DC | PRN

## 2015-10-29 MED ORDER — METHOCARBAMOL 500 MG PO TABS: 500.0000 mg | ORAL_TABLET | Freq: Four times a day (QID) | ORAL | Status: DC | PRN

## 2015-10-29 NOTE — Discharge Summary (Signed)
Physician Discharge Summary  Patient ID: Kelly Bell MRN: [id number] DOB/AGE: 04-17-1941 74 y.o.  Admit date: 10/28/2015 Discharge date: 10/29/2015  Admission Diagnoses: Patient Active Problem List   Diagnosis Date Noted  . Carcinoma of left breast upper inner quadrant ([place]) 10/28/2015  . Family history of breast cancer 09/22/2015  . Breast cancer of upper-inner quadrant of left female breast ([place]) 09/09/2015  . 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 Bradycardia 09/17/2014  . Edema 03/05/2013  . Obesity, Class II, BMI 35-39.9 06/23/2012  . Routine health maintenance 06/23/2012  . LOW BACK PAIN, ACUTE 04/22/2010  . ALLERGIC RHINITIS 08/12/2009  . Other specified disorders of bladder 06/03/2008  . KNEE PAIN, LEFT 06/03/2008  . 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 CHEST PAIN, ATYPICAL, HX OF 10/02/2007  . Diabetes type 2, controlled ([place]) 10/01/2007  . Hyperlipidemia 09/13/2007  . Essential hypertension 09/13/2007  . DEGENERATIVE JOINT DISEASE 09/13/2007  . BREAST CANCER, HX OF 09/13/2007    Discharge Diagnoses:  Active Problems:   Carcinoma of left breast upper inner quadrant [facility]) Same  Discharged Condition: stable  Hospital Course: Patient was admitted to the floor following a left mastectomy with sentinel lymph node biopsy.  She had good pain control no hematoma. She was able to void spontaneously. She was able to eat without nausea and vomiting. She was discharged to home on postop day 1 following instruction to herself and her family regarding drain care.  Consults: None  Significant Diagnostic Studies: none  Treatments: surgery: see above  Discharge Exam: Blood pressure 150/69, pulse 60, temperature 97 F (36.1 C), temperature source Oral, resp. rate 16, height 5\' 5"  (1.651 m), weight 113.116 kg (249 lb 6 oz), SpO2 95 %. General appearance: alert, cooperative and no distress Resp: breathing comfortably Chest wall: left sided chest wall tenderness, as expected Cardio: regular  rate and rhythm  Disposition: 01-Home or Self Care  Discharge Instructions    Call MD for:  difficulty breathing, headache or visual disturbances    Complete by:  As directed      Call MD for:  persistant nausea and vomiting    Complete by:  As directed      Call MD for:  redness, tenderness, or signs of infection (pain, swelling, redness, odor or green/yellow discharge around incision site)    Complete by:  As directed      Call MD for:  severe uncontrolled pain    Complete by:  As directed      Call MD for:  temperature >100.4    Complete by:  As directed      Diet - low sodium heart healthy    Complete by:  As directed      Increase activity slowly    Complete by:  As directed             Medication List    STOP taking these medications        pioglitazone 30 MG tablet  Commonly known as:  ACTOS      TAKE these medications        ACCU-CHEK AVIVA PLUS test strip  Generic drug:  glucose blood  Use to check blood sugar once a day.     aspirin 325 MG tablet  Take 325 mg by mouth daily.     CALTRATE 600+D 600-400 MG-UNIT tablet  Generic drug:  Calcium Carbonate-Vitamin D  Take 1 tablet by mouth 2 (two) times daily.     fluticasone 50 MCG/ACT nasal spray  Commonly  known as:  FLONASE  USE TWO SPRAY IN EACH NOSTRIL ONCE DAILY     hydrochlorothiazide 25 MG tablet  Commonly known as:  HYDRODIURIL  TAKE 1/2 TABLET BY MOUTH EVERY DAY     losartan 50 MG tablet  Commonly known as:  COZAAR  Take 1 tablet (50 mg total) by mouth daily.     methocarbamol 500 MG tablet  Commonly known as:  ROBAXIN  Take 1 tablet (500 mg total) by mouth every 6 (six) hours as needed for muscle spasms.     metoprolol succinate 25 MG 24 hr tablet  Commonly known as:  TOPROL-XL  Take 1 tablet by mouth  daily     nabumetone 500 MG tablet  Commonly known as:  RELAFEN  TAKE ONE TABLET BY MOUTH TWICE DAILY as needed AFTER A MEAL FOR ARTHRITIS     oxybutynin 5 MG tablet  Commonly known as:   DITROPAN  Take 1 tablet by mouth 4  times daily as needed     oxyCODONE 5 MG immediate release tablet  Commonly known as:  Oxy IR/ROXICODONE  Take 1-2 tablets (5-10 mg total) by mouth every 4 (four) hours as needed for moderate pain.           Follow-up Information    Follow up with [facility], MD In 2 weeks.   Specialty:  General Surgery   Contact information:   [address] [place] [address] [phone]       Signed: Stark Klein 10/29/2015, 7:29 AM

## 2015-10-30 ENCOUNTER — Ambulatory Visit: Payer: Medicare Other | Admitting: Family

## 2015-11-03 ENCOUNTER — Telehealth: Payer: Self-pay | Admitting: *Deleted

## 2015-11-03 NOTE — Telephone Encounter (Signed)
-----   Message from Rockwell Germany, RN sent at 10/27/2015  7:01 AM EST ----- Regarding: Care Plan Patient was seen in [facility] 10/26.  She is scheduled for the following.  Surgery - 12/14 - Left Mastectomy Dr. Jana Hakim  - 1/18 Declined genetic testing.  Please let me know if you have any questions.    Thanks,  Varney Biles

## 2015-11-16 ENCOUNTER — Telehealth: Payer: Self-pay | Admitting: Family

## 2015-11-18 NOTE — Telephone Encounter (Signed)
Kelly Bell, pt last seen by you in Sep. 2016. She is currently following with Oncology. When would you like to see pt back for f/u?

## 2015-11-19 NOTE — Telephone Encounter (Signed)
Lets bring her back in this month please.

## 2015-11-20 NOTE — Telephone Encounter (Signed)
Pt says that she had surgery because she has breast cancer on 10/28/15. Pt says that she is recovering and is unable to come in for her fu at the moment. Pt says that she will call us back when she is feeling up to it.

## 2015-11-20 NOTE — Telephone Encounter (Signed)
90 day supply of losartan sent to pharmacy. Please call pt to schedule f/u with Melissa this month.  Thanks!

## 2015-11-25 ENCOUNTER — Encounter: Payer: Self-pay | Admitting: Internal Medicine

## 2015-12-02 ENCOUNTER — Other Ambulatory Visit: Payer: Self-pay | Admitting: *Deleted

## 2015-12-02 ENCOUNTER — Ambulatory Visit (HOSPITAL_BASED_OUTPATIENT_CLINIC_OR_DEPARTMENT_OTHER): Payer: Medicare Other | Admitting: Oncology

## 2015-12-02 VITALS — BP 160/64 | HR 50 | Temp 97.9°F | Resp 18 | Ht 65.0 in | Wt 246.8 lb

## 2015-12-02 DIAGNOSIS — M810 Age-related osteoporosis without current pathological fracture: Secondary | ICD-10-CM | POA: Diagnosis not present

## 2015-12-02 DIAGNOSIS — C50212 Malignant neoplasm of upper-inner quadrant of left female breast: Secondary | ICD-10-CM

## 2015-12-02 MED ORDER — TAMOXIFEN CITRATE 20 MG PO TABS: 20.0000 mg | ORAL_TABLET | Freq: Every day | ORAL | Status: DC

## 2015-12-02 NOTE — Progress Notes (Signed)
Kelly Bell  [phone] [phone]     ID: VONDELL TROTTIER DOB: January 22, 1941  MR#: [id number]  [id number]  Patient Care Team: Debbrah Alar, NP as PCP - General (Internal Medicine) Marybelle Killings, MD (Orthopedic Surgery) Stark Klein, MD as Consulting Physician (General Surgery) Chauncey Cruel, MD as Consulting Physician (Oncology) Kyung Rudd, MD as Consulting Physician (Radiation Oncology) Rockwell Germany, RN as Registered Nurse Mauro Kaufmann, RN as Registered Nurse Sylvan Cheese, NP as Nurse Practitioner (Hematology and Oncology) PCP: Nance Pear., NP OTHER MD: Rodell Perna MD  CHIEF COMPLAINT: Estrogen receptor positive breast cancer  CURRENT TREATMENT:    BREAST CANCER HISTORY: From the original intake note:  Kelly Bell had a right lumpectomy in 1987 for which she describes as a "tiny" tumor which was lymph node negative. She had adjuvant radiation and then 5 years of tamoxifen under the care of Dr. Sonny Dandy.  More recently, bilateral screening mammography with tomosynthesis 08/25/2015 at [facility] found a new cluster of calcifications in the upper inner quadrant of the left breast. Left diagnostic mammography 08/31/2015 showed the breast composition to be category C. There was a suspicious area of calcifications in the left breast upper inner quadrant measuring 4 cm. This was biopsied 09/01/2015, and showed (SAA [id number]) ductal carcinoma in situ, low-grade, involving a papilloma. The estrogen receptor was under percent positive. The progesterone receptor was under percent positive, both with strong staining intensity.  The patient's subsequent history is as detailed below.   INTERVAL HISTORY: Kelly Bell returns today for follow-up of her ductal carcinoma in situ. Since her last visit here she underwent left modified radical mastectomy, with the final pathology ([id number]) showing a 3.5C meter area of ductal carcinoma in situ, low-grade,  with ample margins. All 3 sentinel lymph nodes were clear.--Her case was also presented at the multidisciplinary breast cancer conference earlier today. At that point it was felt she would not need any radiation. She could consider hormone therapy prophylactically  REVIEW OF SYSTEMS: Kelly Bell is upset because she hadn't felt she was going to have a lumpectomy and did not know she was going to have a mastectomy until the actual day of surgery. She tells me her family has discussed this with Dr. Barry Dienes. Aside from that she still has significant pain and soreness in the surgical field, which "comes and goes", more like shooting pains. Of course she has occasional headaches, and she has arthritis pains here in there which are not more frequent or intense than prior. She has not noticed any left upper extremity lymphedema. A detailed review of systems today was otherwise stable.  PAST MEDICAL HISTORY: Past Medical History  Diagnosis Date  . Breast cancer ([place])     Right  . Hyperlipidemia   . HTN (hypertension)   . Type II or unspecified type diabetes mellitus without mention of complication, not stated as uncontrolled   . Irritable bladder   . Knee pain     Bilateral    PAST SURGICAL HISTORY: Past Surgical History  Procedure Laterality Date  . Mastectomy partial / lumpectomy w/ axillary lymphadenectomy    . Abdominal hysterectomy  1975  . Shoulder arthroscopy  2000  . Colonoscopy  05/10/05  . Mastectomy w/ sentinel node biopsy Left 10/28/2015    Procedure: LEFT MASTECTOMY WITH SENTINEL LYMPH NODE BIOPSY;  Surgeon: Stark Klein, MD;  Location: [place];  Service: General;  Laterality: Left;    FAMILY HISTORY Family History  Problem Relation  Age of Onset  . Diabetes Mother 41    Deceased  . Pancreatic cancer Mother     dx. early 41s  . Diabetes Father 48    Deceased  . Heart failure Father   . Heart disease Father     CHF  . Breast cancer Sister 71  . Cancer Brother      oral cancer - metastasis to lungs; smoker  . Corneal Dystrophy Son     bilateral  . Healthy Daughter     x1  . Breast cancer Sister     dx. 59s  . Cancer Cousin     oral cancer dx. <60; +EtOH, smoker; daughter of father's paternal half-brother   the patient's father died at the age of 75 from non-cancer related causes. The patient's mother died at the age of 6, from pancreatic cancer. The patient had 4 brothers, 4 sisters. One sister died from breast cancer at the age of 7. One brother died from cancer of the head and neck. A second sister has been diagnosed recently with breast cancer. She is in her 26s. There is no history of ovarian cancer in the family  GYNECOLOGIC HISTORY:  No LMP recorded. Patient has had a hysterectomy. Menarche age 45, first live birth age 76, the patient is Kelly Bell P2. She underwent hysterectomy without salpingo-oophorectomy in 1975. She did not take hormone replacement. She took birth controls for some period of time in the past but does not recall how long. 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 She worked a total of 28 years in [place]. Her husband Ezell is a retired Chemical engineer. He used to work as a Company secretary as well. The patient's son Vicente Males is disabled and lives in [place]. The patient's daughter Luciana Axe also lives in [place]. She is a Theatre manager. The patient has 3 grandchildren. She attends a local [employer]: Not in place   HEALTH MAINTENANCE: Social History  Substance Use Topics  . Smoking status: Never Smoker   . Smokeless tobacco: Never Used  . Alcohol Use: No     Colonoscopy: 2011/lobe our  PAP:  Bone density: 10/16/2014/[place]/D- 1.5 (osteopenia)  Lipid panel:  Allergies  Allergen Reactions  . Pravastatin Other (See Comments)    Severe muscle cramps.  . Lisinopril     cough    Current Outpatient Prescriptions  Medication Sig Dispense Refill  .  ACCU-CHEK AVIVA PLUS test strip Use to check blood sugar once a day. 100 each 1  . aspirin 325 MG tablet Take 325 mg by mouth daily.      . Calcium Carbonate-Vitamin D (CALTRATE 600+D) 600-400 MG-UNIT per tablet Take 1 tablet by mouth 2 (two) times daily.    . fluticasone (FLONASE) 50 MCG/ACT nasal spray USE TWO SPRAY IN EACH NOSTRIL ONCE DAILY 48 g 1  . hydrochlorothiazide (HYDRODIURIL) 25 MG tablet TAKE 1/2 TABLET BY MOUTH EVERY DAY 45 tablet 1  . losartan (COZAAR) 50 MG tablet TAKE ONE TABLET BY MOUTH ONCE DAILY 90 tablet 0  . methocarbamol (ROBAXIN) 500 MG tablet Take 1 tablet (500 mg total) by mouth every 6 (six) hours as needed for muscle spasms. 30 tablet 0  . metoprolol succinate (TOPROL-XL) 25 MG 24 hr tablet Take 1 tablet by mouth  daily 90 tablet 1  . nabumetone (RELAFEN) 500 MG tablet TAKE ONE TABLET BY MOUTH TWICE DAILY as needed AFTER A MEAL FOR ARTHRITIS  180 tablet 1  . oxybutynin (DITROPAN) 5 MG tablet Take 1 tablet by mouth 4  times daily as needed 360 tablet 1  . oxyCODONE (OXY IR/ROXICODONE) 5 MG immediate release tablet Take 1-2 tablets (5-10 mg total) by mouth every 4 (four) hours as needed for moderate pain. 30 tablet 0  . [DISCONTINUED] oxybutynin (DITROPAN) 5 MG tablet TAKE ONE TABLET BY MOUTH 4 TIMES DAILY AS NEEDED 120 tablet 5   No current facility-administered medications for this visit.    OBJECTIVE: Older African-American woman in no acute distress Filed Vitals:   12/02/15 1012  BP: 160/64  Pulse: 50  Temp: 97.9 F (36.6 C)  Resp: 18     Body mass index is 41.07 kg/(m^2).    ECOG FS:1 - Symptomatic but completely ambulatory  Sclerae unicteric, pupils round and equal Oropharynx clear and moist-- no thrush or other lesions No cervical or supraclavicular adenopathy Lungs no rales or rhonchi Heart regular rate and rhythm Abd soft, obese, nontender, positive bowel sounds MSK no focal spinal tenderness, right upper extremity lymphedema, grade 1; no left upper  extremity lymphedema Neuro: nonfocal, well oriented, appropriate affect Breasts: The right breast is status post remote lumpectomy. There is no evidence of residual or recurrent disease. The right axilla is benign. The left breast is status post recent mastectomy. The incision is healing very nicely. There is no erythema, swelling, or dehiscence. The left axilla is benign.   LAB RESULTS:  CMP     Component Value Date/Time   NA 139 10/26/2015 1000   NA 140 09/09/2015 1209   K 3.8 10/26/2015 1000   K 4.3 09/09/2015 1209   CL 107 10/26/2015 1000   CO2 26 10/26/2015 1000   CO2 26 09/09/2015 1209   GLUCOSE 110* 10/26/2015 1000   GLUCOSE 123 09/09/2015 1209   BUN 9 10/26/2015 1000   BUN 15.2 09/09/2015 1209   CREATININE 0.71 10/26/2015 1000   CREATININE 0.9 09/09/2015 1209   CREATININE 0.76 04/17/2014 1115   CALCIUM 9.6 10/26/2015 1000   CALCIUM 10.3 09/09/2015 1209   PROT 7.3 09/09/2015 1209   PROT 6.3 04/17/2014 1115   ALBUMIN 4.1 09/09/2015 1209   ALBUMIN 4.0 04/17/2014 1115   AST 20 09/09/2015 1209   AST 17 04/17/2014 1115   ALT 20 09/09/2015 1209   ALT 15 04/17/2014 1115   ALKPHOS 59 09/09/2015 1209   ALKPHOS 44 04/17/2014 1115   BILITOT 0.66 09/09/2015 1209   BILITOT 0.5 04/17/2014 1115   GFRNONAA >60 10/26/2015 1000   GFRNONAA 78 04/17/2014 1115   GFRAA >60 10/26/2015 1000   GFRAA 89 04/17/2014 1115    INo results found for: SPEP, UPEP  Lab Results  Component Value Date   WBC 3.2* 09/09/2015   NEUTROABS 1.6 09/09/2015   HGB 14.0 09/09/2015   HCT 42.5 09/09/2015   MCV 94.2 09/09/2015   PLT 197 09/09/2015      Chemistry      Component Value Date/Time   NA 139 10/26/2015 1000   NA 140 09/09/2015 1209   K 3.8 10/26/2015 1000   K 4.3 09/09/2015 1209   CL 107 10/26/2015 1000   CO2 26 10/26/2015 1000   CO2 26 09/09/2015 1209   BUN 9 10/26/2015 1000   BUN 15.2 09/09/2015 1209   CREATININE 0.71 10/26/2015 1000   CREATININE 0.9 09/09/2015 1209   CREATININE  0.76 04/17/2014 1115      Component Value Date/Time   CALCIUM 9.6 10/26/2015 1000  CALCIUM 10.3 09/09/2015 1209   ALKPHOS 59 09/09/2015 1209   ALKPHOS 44 04/17/2014 1115   AST 20 09/09/2015 1209   AST 17 04/17/2014 1115   ALT 20 09/09/2015 1209   ALT 15 04/17/2014 1115   BILITOT 0.66 09/09/2015 1209   BILITOT 0.5 04/17/2014 1115       No results found for: LABCA2  No components found for: LABCA125  No results for input(s): INR in the last 168 hours.  Urinalysis    Component Value Date/Time   COLORURINE LT. YELLOW 03/05/2013 0930   APPEARANCEUR CLEAR 03/05/2013 0930   LABSPEC 1.020 03/05/2013 0930   PHURINE 6.0 03/05/2013 0930   GLUCOSEU 100 03/05/2013 0930   HGBUR NEGATIVE 03/05/2013 0930   BILIRUBINUR NEGATIVE 03/05/2013 0930   KETONESUR NEGATIVE 03/05/2013 0930   UROBILINOGEN 0.2 03/05/2013 0930   NITRITE NEGATIVE 03/05/2013 0930   LEUKOCYTESUR NEGATIVE 03/05/2013 0930    STUDIES: No results found.  ASSESSMENT: 75 y.o. Kelly Bell woman  (1) status post right lumpectomy in 1987 for a stage I invasive breast cancer, treated with adjuvant radiation and tamoxifen for 5 years  (a) chronic right upper extremity lymphedema, grade 1  (2) status post left breast upper inner quadrant biopsy 09/01/2015 for ductal carcinoma in situ, low-grade, extending approximately 4 cm, estrogen and progesterone receptor positive  (3) status post left mastectomy and sentinel lymph node sampling 10/28/2015 for ductal carcinoma in situ, grade 1, measuring 3.5 cm, with ample margins, and all 3 sentinel lymph nodes clear  (4) genetics testing declined by patient  (5) adjuvant tamoxifen started 12/02/2015  (6) osteopenia, with bone density December 2015 showing a T score of -1.5  PLAN: Maeley did well with her surgery. She wishes she could've had a lumpectomy, and does not feel she was adequately informed of all the results of the biopsies and MRIs leading to the mastectomy decision, so  she was surprised at the very and and she is still dealing with that issue. However she understands that the mastectomy was inevitable. She is pleased with the final results. She understands she is essentially cured of the left sided breast cancer.  I quoted her a risk of developing another breast cancer (this would be her third) over her remaining lifetime of approximately 10%. If she took anti-estrogens for 5 years that risk would drop by about 5%. We discussed the possible toxicities, side effects and complications of tamoxifen versus anastrozole and she has a good understanding of that data, which was given to her in writing.  She did very well with tamoxifen previously and would prefer to take it again for an additional 5 years. This is quite reasonable.  Accordingly we went ahead and placed a tamoxifen prescription to start today. I also wrote her a prescription for a left prosthesis as well as bra. Finally I am referring her to physical therapy partly because of the chronic right upper extremity lymphedema, and partly for review on how to prevent lymphedema on the left upper extremity.  She is going to return to see [place] in May. If she is doing well at that time we will start seeing her on a once a year basis.  She has a good understanding of this plan. She agrees with it. She knows to call for any problems that may develop before her next visit here.       Chauncey Cruel, MD   12/02/2015 10:20 AM Medical Oncology and Hematology [facility] [address],  [place] [address] Tel. [phone]    Fax. [phone]

## 2015-12-03 ENCOUNTER — Telehealth: Payer: Self-pay | Admitting: Nurse Practitioner

## 2015-12-03 ENCOUNTER — Encounter: Payer: Self-pay | Admitting: *Deleted

## 2015-12-03 NOTE — Telephone Encounter (Signed)
Voicemail was full,printed calendars and letter to patient

## 2015-12-09 ENCOUNTER — Ambulatory Visit: Payer: Medicare Other | Attending: Oncology | Admitting: Physical Therapy

## 2015-12-09 DIAGNOSIS — M25612 Stiffness of left shoulder, not elsewhere classified: Secondary | ICD-10-CM | POA: Insufficient documentation

## 2015-12-09 DIAGNOSIS — R6889 Other general symptoms and signs: Secondary | ICD-10-CM | POA: Diagnosis present

## 2015-12-09 DIAGNOSIS — I890 Lymphedema, not elsewhere classified: Secondary | ICD-10-CM | POA: Insufficient documentation

## 2015-12-09 DIAGNOSIS — R29898 Other symptoms and signs involving the musculoskeletal system: Secondary | ICD-10-CM | POA: Diagnosis present

## 2015-12-09 NOTE — Therapy (Signed)
[place], [place], [address] Phone: [phone]   Fax:  [phone]  Physical Therapy Evaluation  Patient Details  Name: Kelly Bell MRN: [id number] Date of Birth: November 06, 1941 Referring Provider: Dr. Derry Bell  Encounter Date: 12/09/2015      PT End of Session - 12/09/15 1542    Visit Number 1   Number of Visits 9   Date for PT Re-Evaluation 02/08/16   PT Start Time 1350   PT Stop Time 1432   PT Time Calculation (min) 42 min   Activity Tolerance Patient tolerated treatment well   Behavior During Therapy [facility] for tasks assessed/performed      Past Medical History  Diagnosis Date  . Breast cancer ([place])     Right  . Hyperlipidemia   . HTN (hypertension)   . Type II or unspecified type diabetes mellitus without mention of complication, not stated as uncontrolled   . Irritable bladder   . Knee pain     Bilateral    Past Surgical History  Procedure Laterality Date  . Mastectomy partial / lumpectomy w/ axillary lymphadenectomy    . Abdominal hysterectomy  1975  . Shoulder arthroscopy  2000  . Colonoscopy  05/10/05  . Mastectomy w/ sentinel node biopsy Left 10/28/2015    Procedure: LEFT MASTECTOMY WITH SENTINEL LYMPH NODE BIOPSY;  Surgeon: Stark Klein, MD;  Location: [place];  Service: General;  Laterality: Left;    There were no vitals filed for this visit.  Visit Diagnosis:  Stiffness of joint, shoulder region, left - Plan: PT plan of care cert/re-cert  Weakness of left arm - Plan: PT plan of care cert/re-cert  Impaired function of upper extremity - Plan: PT plan of care cert/re-cert  Lymphedema - Plan: PT plan of care cert/re-cert      Subjective Assessment - 12/09/15 1358    Subjective "I'm here for my left arm to help me get my arm back in shape.  My shoulder is killing me today."  Had rotator cuff surgery in about 2000; did fairly well from that but has had  some trouble on and off, and had an injuection in it.   Pertinent History Left breast cancer with mastectomy 10/28/15 with no nodes removed; no other treatment except tamoxifen (possibly).  h/o right breast cancer in 1987 with lumpectomy and sentinel node biopsy (nodes negative); radiation and took tamoxifen for 5 years.  Diabetes, HTN, h/o left rotator cuff surgery.  Hysterectomy in 1970s.  Patient developed lymphedema in right UE and was treated with complete decongestive therapy years ago for that; she says it flares now and again.   Patient Stated Goals get the left arm moving better   Currently in Pain? Yes   Pain Score 10-Worst pain ever   Pain Location Shoulder   Pain Orientation Left   Pain Descriptors / Indicators Other (Comment)  "pain I'm familiar with cause I've had it before"   Aggravating Factors  pt. doesn't know; isn't using her arm a lot; flares up even before the breast surgery   Pain Relieving Factors arthritis            [facility] PT Assessment - 12/09/15 0001    Assessment   Medical Diagnosis left breast cancer with mastectomy; h/o right breast cancer with lumpectomy and radiation   Referring Provider Dr. Derry Bell   Onset Date/Surgical Date 10/28/15   Hand Dominance Left   Precautions   Precautions Other (comment)  Precaution Comments cancer precautions; left UE lymphedema   Restrictions   Weight Bearing Restrictions No   Balance Screen   Has the patient fallen in the past 6 months No   Has the patient had a decrease in activity level because of a fear of falling?  No   Is the patient reluctant to leave their home because of a fear of falling?  No   Home Environment   Living Environment Private residence   Living Arrangements Spouse/significant other   Type of [place] One level   Prior Function   Level of [place] Retired   [facility]; stopped in November.  Did weight  machines and water aerobics, and wants to go back to that.   Cognition   Overall Cognitive Status Within Functional Limits for tasks assessed   Observation/Other Assessments   Skin Integrity left mastectomy incision well healed except at very lateral aspect where it appears to still have a scab; old right lumpectomy scar has faded and looks fine   Quick DASH  50   Posture/Postural Control   Posture/Postural Control Postural limitations   Postural Limitations Rounded Shoulders;Forward head   ROM / Strength   AROM / PROM / Strength AROM;Strength   AROM   AROM Assessment Site Shoulder   Right/Left Shoulder Right;Left   Right Shoulder Flexion 163 Degrees   Right Shoulder ABduction 180 Degrees   Right Shoulder Internal Rotation --  WFL in supine   Right Shoulder External Rotation 64 Degrees   Left Shoulder Flexion 139 Degrees   Left Shoulder ABduction 149 Degrees   Left Shoulder Internal Rotation --  WFL in supine   Left Shoulder External Rotation 50 Degrees   Strength   Overall Strength Comments right shoulder grossly 5- to 5/5; left shoulder grossly 4/5 with some pain   Ambulation/Gait   Ambulation/Gait Yes   Ambulation/Gait Assistance 7: Independent  per her report           LYMPHEDEMA/ONCOLOGY QUESTIONNAIRE - 12/09/15 1408    Type   Cancer Type left breast    Surgeries   Mastectomy Date 10/28/15   Number Lymph Nodes Removed 0   Lymphedema Assessments   Lymphedema Assessments Upper extremities   Right Upper Extremity Lymphedema   10 cm Proximal to Olecranon Process 39.8 cm   Olecranon Process 30.6 cm   10 cm Proximal to Ulnar Styloid Process 27.1 cm   Just Proximal to Ulnar Styloid Process 20.1 cm   Across Hand at [employer] 20.6 cm   At [place] of 2nd Digit 7 cm   Left Upper Extremity Lymphedema   10 cm Proximal to Olecranon Process 40.3 cm   Olecranon Process 29.6 cm   10 cm Proximal to Ulnar Styloid Process 24 cm   Just Proximal to Ulnar Styloid Process 19.2  cm   Across Hand at [employer] 19.3 cm   At [place] of 2nd Digit 6.6 cm           Quick Dash - 12/09/15 0001    Open a tight or new jar Moderate difficulty   Do heavy household chores (wash walls, wash floors) Unable   Wash your back Moderate difficulty   Use a knife to cut food No difficulty   Recreational activities in which you take some force or impact through your arm, shoulder, or hand (golf, hammering, tennis) Unable   During  the past week, to what extent has your arm, shoulder or hand problem interfered with your normal social activities with family, friends, neighbors, or groups? Quite a bit   During the past week, to what extent has your arm, shoulder or hand problem limited your work or other regular daily activities Quite a bit   Arm, shoulder, or hand pain. Severe   Tingling (pins and needles) in your arm, shoulder, or hand Mild   Difficulty Sleeping Mild difficulty   DASH Score 50 %                             Long Term Clinic Goals - 2015-12-18 1549    CC Long Term Goal  #1   Title Independent with HEP for left shoulder ROM and strengthening, including rotator cuff strengthening.   Time 4   Period Weeks   Status New   CC Long Term Goal  #2   Title left shoulder active flexion to at least 155 degrees for improved overhead reach   Baseline 139 degrees at eval compared to 163 on the right   Time 4   Period Weeks   Status New   CC Long Term Goal  #3   Title left shoulder active abduction to at least 160 degrees for improved ADLs   Baseline 149 at eval compared to 180 on the right   Time 4   Period Weeks   CC Long Term Goal  #4   Title will report at least 50% decrease in left shoulder pain in intensity and/or frequency   Baseline up to 10/10   Time 4   Period Weeks   Status New            Plan - December 18, 2015 1542    Clinical Impression Statement Patient with left breast cancer with mastectomy 10/28/15 (no nodes removed and no other  treatment planned) and with h/o right breast cancer some 30 years ago with lumpectomy that side.  She developed lymphedema in the right UE which she says flares at times, but which is minimal today, with small circumference differences right to left UE.  She does have mildly limited ROM in left shoulder.  She also has mild weakness there and she c/o some pain in that shoulder.  She has a h/o left shoulder rotator cuff repair some years ago.  She has a high DASH score of 50 (even having left one answer blank).   Pt will benefit from skilled therapeutic intervention in order to improve on the following deficits Decreased range of motion;Decreased strength;Impaired UE functional use;Pain   Rehab Potential Good   Clinical Impairments Affecting Rehab Potential h/o left shoulder problems   PT Frequency 1x / week  she wants to minimize PT visits   PT Duration 4 weeks  to 8 weeks prn   PT Treatment/Interventions Therapeutic exercise;Patient/family education;ADLs/Self Care Home Management;Passive range of motion   PT Next Visit Plan Begin HEP instruction for left shoulder ROM and strengthening.  Include [place] or other rotator cuff strengthening in her HEP.   PT Home Exercise Plan continue stretches she learned at [place] and Agree with Plan of Care Patient          G-Codes - December 18, 2015 1552    Functional Assessment Tool Used quick DASH   Functional Limitation Carrying, moving and handling objects   Carrying, Moving and Handling Objects Current Status [id number]) At least 40 percent but  less than 60 percent impaired, limited or restricted   Carrying, Moving and Handling Objects Goal Status [phone]) At least 20 percent but less than 40 percent impaired, limited or restricted       Problem List Patient Active Problem List   Diagnosis Date Noted  . Carcinoma of left breast upper inner quadrant ([place]) 10/28/2015  . Family history of breast cancer 09/22/2015  . Breast cancer of upper-inner  quadrant of left female breast ([place]) 09/09/2015  . 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 Bradycardia 09/17/2014  . Edema 03/05/2013  . Obesity, Class II, BMI 35-39.9 06/23/2012  . Routine health maintenance 06/23/2012  . LOW BACK PAIN, ACUTE 04/22/2010  . ALLERGIC RHINITIS 08/12/2009  . Other specified disorders of bladder 06/03/2008  . KNEE PAIN, LEFT 06/03/2008  . 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 CHEST PAIN, ATYPICAL, HX OF 10/02/2007  . Diabetes type 2, controlled ([place]) 10/01/2007  . Hyperlipidemia 09/13/2007  . Essential hypertension 09/13/2007  . DEGENERATIVE JOINT DISEASE 09/13/2007  . BREAST CANCER, HX OF 09/13/2007    Kelly Bell 12/09/2015, 3:55 PM  [place], [place], [address] Phone: [phone]   Fax:  [phone]  Name: Kelly Bell MRN: [id number] Date of Birth: 03/26/1941   Serafina Royals, PT 12/09/2015 3:55 PM

## 2015-12-15 ENCOUNTER — Ambulatory Visit: Payer: Medicare Other | Admitting: Physical Therapy

## 2015-12-15 DIAGNOSIS — R29898 Other symptoms and signs involving the musculoskeletal system: Secondary | ICD-10-CM

## 2015-12-15 DIAGNOSIS — M25612 Stiffness of left shoulder, not elsewhere classified: Secondary | ICD-10-CM

## 2015-12-15 DIAGNOSIS — R6889 Other general symptoms and signs: Secondary | ICD-10-CM

## 2015-12-15 NOTE — Patient Instructions (Signed)
Strengthening: Resisted Internal Rotation   Hold tubing in left hand, elbow at side and forearm out. Rotate forearm in across body. Repeat _10___ times per set. Do __1-3__ sets per session. Do __1__ sessions per day.  http://orth.exer.us/830   Copyright  VHI. All rights reserved.  Strengthening: Resisted External Rotation   Hold tubing in right hand, elbow at side and forearm across body. Rotate forearm out. Repeat _10___ times per set. Do __1-3__ sets per session. Do _1___ sessions per day.  http://orth.exer.us/828   Copyright  VHI. All rights reserved.  Strengthening: Resisted Flexion   Hold tubing with left arm at side. Pull forward and up. Move shoulder through pain-free range of motion. Repeat __10__ times per set. Do _1-3___ sets per session. Do _1___ sessions per day.  http://orth.exer.us/824   Copyright  VHI. All rights reserved.  Strengthening: Resisted Extension   Hold tubing in right hand, arm forward. Pull arm back, elbow straight. Repeat __10__ times per set. Do _1-3___ sets per session. Do __1__ sessions per day.  Scapular Retraction: Bilateral     You may be sitting or standing.  Wrap the Theraband around a doorknob and close the door on it. Facing anchor, pull arms back, bringing shoulder blades together. Repeat _10___ times per set. Do __1-3__ sets per session. Do __1__ sessions per day.  http://orth.exer.us/177   Copyright  VHI. All rights reserved.    http://orth.exer.us/832   Copyright  VHI. All rights reserved.

## 2015-12-15 NOTE — Therapy (Signed)
[place], [place], [address] Phone: [phone]   Fax:  [phone]  Physical Therapy Treatment  Patient Details  Name: Kelly Bell MRN: [id number] Date of Birth: 11-07-1941 Referring Provider: Dr. Derry Skill  Encounter Date: 12/15/2015      PT End of Session - 12/15/15 1736    Visit Number 2   Number of Visits 9   Date for PT Re-Evaluation 02/08/16   PT Start Time 1520   PT Stop Time 7096   PT Time Calculation (min) 44 min   Activity Tolerance Patient tolerated treatment well   Behavior During Therapy [facility] for tasks assessed/performed      Past Medical History  Diagnosis Date  . Breast cancer ([place])     Right  . Hyperlipidemia   . HTN (hypertension)   . Type II or unspecified type diabetes mellitus without mention of complication, not stated as uncontrolled   . Irritable bladder   . Knee pain     Bilateral    Past Surgical History  Procedure Laterality Date  . Mastectomy partial / lumpectomy w/ axillary lymphadenectomy    . Abdominal hysterectomy  1975  . Shoulder arthroscopy  2000  . Colonoscopy  05/10/05  . Mastectomy w/ sentinel node biopsy Left 10/28/2015    Procedure: LEFT MASTECTOMY WITH SENTINEL LYMPH NODE BIOPSY;  Surgeon: Stark Klein, MD;  Location: [place];  Service: General;  Laterality: Left;    There were no vitals filed for this visit.  Visit Diagnosis:  Stiffness of joint, shoulder region, left  Weakness of left arm  Impaired function of upper extremity      Subjective Assessment - 12/15/15 1522    Subjective "I'm tired today.  I don't know why.  I was out yesterday, then I came home and felt like I had been working 10 hours."   Currently in Pain? No/denies                         [facility] Adult PT Treatment/Exercise - 12/15/15 0001    Shoulder Exercises: Supine   Other Supine Exercises supine scapular stabilization series:   narrow and wide grip with tension on red Theraband x 10 each; horizontal abduction vs. red x 10, diagonals x 10 each, bilateral ER vs. red x 10   Shoulder Exercises: Standing   Retraction Strengthening;Both;10 reps;Theraband  red   Other Standing Exercises Rockwood 4 vs. red Theraband for left shoulder, 10 reps each   Manual Therapy   Manual Therapy Passive ROM   Manual therapy comments     Passive ROM left shoulder for IR, ER, abduction, and flexion to patient tolerance                PT Education - 12/15/15 1527    Education provided Yes   Education Details [facility] [employer] [facility] about doing a walking program for exercise; Rockwood 4 and bilateral scapular retraction vs. red Theraband x 10 each   Person(s) Educated Patient   Methods Explanation;Handout   Comprehension Verbalized understanding;Returned demonstration                [place] - 12/15/15 1738    CC Long Term Goal  #1   Title Independent with HEP for left shoulder ROM and strengthening, including rotator cuff strengthening.   Status Partially Met   CC Long Term Goal  #2   Title left shoulder  active flexion to at least 155 degrees for improved overhead reach   Status On-going   CC Long Term Goal  #3   Title left shoulder active abduction to at least 160 degrees for improved ADLs   Status On-going   CC Long Term Goal  #4   Title will report at least 50% decrease in left shoulder pain in intensity and/or frequency   Status On-going            Plan - 12/15/15 1736    Clinical Impression Statement Did well today with doing quite a few new shoulder strengthening exercises.  Left shoulder quite stiff with PROM.   Pt will benefit from skilled therapeutic intervention in order to improve on the following deficits Decreased range of motion;Decreased strength;Impaired UE functional use;Pain   Rehab Potential Good   Clinical Impairments Affecting Rehab Potential h/o left shoulder  problems   PT Frequency 1x / week   PT Duration 4 weeks   PT Treatment/Interventions Therapeutic exercise;Patient/family education;Passive range of motion   PT Next Visit Plan Review Rockwood and scapular retraction prn; add supine scapular stabilization to HEP; continue ROM   PT Home Exercise Plan continue stretches she learned at [facility]   Consulted and Agree with Plan of Care Patient        Problem List Patient Active Problem List   Diagnosis Date Noted  . 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 Family history of breast cancer 09/22/2015  . Breast cancer of upper-inner quadrant of left female breast ([place]) 09/09/2015  . 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 Bradycardia 09/17/2014  . Edema 03/05/2013  . Obesity, Class II, BMI 35-39.9 06/23/2012  . Routine health maintenance 06/23/2012  . LOW BACK PAIN, ACUTE 04/22/2010  . ALLERGIC RHINITIS 08/12/2009  . Other specified disorders of bladder 06/03/2008  . KNEE PAIN, LEFT 06/03/2008  . 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 CHEST PAIN, ATYPICAL, HX OF 10/02/2007  . Diabetes type 2, controlled ([place]) 10/01/2007  . Hyperlipidemia 09/13/2007  . Essential hypertension 09/13/2007  . DEGENERATIVE JOINT DISEASE 09/13/2007  . BREAST CANCER, HX OF 09/13/2007    SALISBURY,DONNA 12/15/2015, 5:40 PM  [place], [place], [address] Phone: [phone]   Fax:  [phone]  Name: Kelly Bell MRN: [id number] Date of Birth: 1941/02/23    Serafina Royals, PT 12/15/2015 5:40 PM

## 2015-12-23 ENCOUNTER — Telehealth: Payer: Self-pay | Admitting: *Deleted

## 2015-12-23 ENCOUNTER — Encounter: Payer: Self-pay | Admitting: Oncology

## 2015-12-23 ENCOUNTER — Ambulatory Visit: Payer: Medicare Other | Attending: Oncology | Admitting: Physical Therapy

## 2015-12-23 DIAGNOSIS — R6889 Other general symptoms and signs: Secondary | ICD-10-CM | POA: Diagnosis present

## 2015-12-23 DIAGNOSIS — R29898 Other symptoms and signs involving the musculoskeletal system: Secondary | ICD-10-CM | POA: Diagnosis present

## 2015-12-23 DIAGNOSIS — M25612 Stiffness of left shoulder, not elsewhere classified: Secondary | ICD-10-CM | POA: Diagnosis present

## 2015-12-23 DIAGNOSIS — I890 Lymphedema, not elsewhere classified: Secondary | ICD-10-CM | POA: Diagnosis present

## 2015-12-23 NOTE — Patient Instructions (Signed)
Over Head Pull: Narrow Grip     Cancer Rehab [phone]   On back, knees bent, feet flat, band across thighs, elbows straight but relaxed. Pull hands apart (start). Keeping elbows straight, bring arms up and over head, hands toward floor. Keep pull steady on band. Hold momentarily. Return slowly, keeping pull steady, back to start. Repeat _10__ times. Band color _red_____   Do the same as just above but with your hands in a wider grip.    Side Pull: Double Arm   On back, knees bent, feet flat. Arms perpendicular to body, shoulder level, elbows straight but relaxed. Pull arms out to sides, elbows straight. Resistance band comes across collarbones, hands toward floor. Hold momentarily. Slowly return to starting position. Repeat _10__ times. Band color __red___   Elmer Picker   On back, knees bent, feet flat, left hand on left hip, right hand above left. Pull right arm DIAGONALLY (hip to shoulder) across chest. Bring right arm along head toward floor. Hold momentarily. Slowly return to starting position. Repeat _10__ times. Do with left arm. Band color __red____   Shoulder Rotation: Double Arm   On back, knees bent, feet flat, elbows tucked at sides, bent 90, hands palms up. Pull hands apart and down toward floor, keeping elbows near sides. Hold momentarily. Slowly return to starting position. Repeat _10__ times. Band color __red____    Do these once a day.

## 2015-12-23 NOTE — Therapy (Signed)
[place], [place], [address] Phone: [phone]   Fax:  [phone]  Physical Therapy Treatment  Patient Details  Name: Kelly Bell MRN: [id number] Date of Birth: Aug 17, 1941 Referring Provider: Dr. Derry Skill  Encounter Date: 12/23/2015      PT End of Session - 12/23/15 1558    Visit Number 3   Number of Visits 9   Date for PT Re-Evaluation 02/08/16   PT Start Time 5830   PT Stop Time 1559   PT Time Calculation (min) 44 min   Activity Tolerance Patient tolerated treatment well   Behavior During Therapy [facility] for tasks assessed/performed      Past Medical History  Diagnosis Date  . Breast cancer ([place])     Right  . Hyperlipidemia   . HTN (hypertension)   . Type II or unspecified type diabetes mellitus without mention of complication, not stated as uncontrolled   . Irritable bladder   . Knee pain     Bilateral    Past Surgical History  Procedure Laterality Date  . Mastectomy partial / lumpectomy w/ axillary lymphadenectomy    . Abdominal hysterectomy  1975  . Shoulder arthroscopy  2000  . Colonoscopy  05/10/05  . Mastectomy w/ sentinel node biopsy Left 10/28/2015    Procedure: LEFT MASTECTOMY WITH SENTINEL LYMPH NODE BIOPSY;  Surgeon: Stark Klein, MD;  Location: [place];  Service: General;  Laterality: Left;    There were no vitals filed for this visit.  Visit Diagnosis:  Stiffness of joint, shoulder region, left  Weakness of left arm  Impaired function of upper extremity  Lymphedema      Subjective Assessment - 12/23/15 1517    Subjective Feeling better today, not as tired as she was last time. My arm feels better too.  I can do a whole lot with it; I can raise it up and do a whole lot with it.  Has been doing the exercises at home.   Currently in Pain? No/denies            [facility] PT Assessment - 12/23/15 0001    AROM   Left Shoulder Flexion 145  Degrees   Left Shoulder ABduction 158 Degrees                     [facility] Adult PT Treatment/Exercise - 12/23/15 0001    Self-Care   Self-Care --   Other Self-Care Comments  Discussed taking blood pressure in ankle when she is lying supine; getting labs drawn below the waist; and doing finger sticks at home in the left hand.   Shoulder Exercises: Supine   Other Supine Exercises supine scapular stabilization series:  narrow and wide grip with tension on red Theraband x 10 each; horizontal abduction vs. red x 10, diagonals x 10 each, bilateral ER vs. red x 10   Shoulder Exercises: Standing   Retraction Strengthening;Both;10 reps;Theraband  red   Other Standing Exercises Reviewed Rockwood 4 vs. red Theraband for left shoulder, 10 reps each   Shoulder Exercises: ROM/Strengthening   Other ROM/Strengthening Exercises     Manual Therapy   Passive ROM left shoulder for IR, ER, abduction, and flexion to patient tolerance                PT Education - 12/23/15 1558    Education provided Yes   Education Details supine scapular series with red Theraband   Person(s) Educated Patient  Methods Explanation;Handout;Verbal cues   Comprehension Verbalized understanding;Returned demonstration                Long Term Clinic Goals - 12/23/15 1601    CC Long Term Goal  #1   Title Independent with HEP for left shoulder ROM and strengthening, including rotator cuff strengthening.   Status Partially Met   CC Long Term Goal  #2   Title left shoulder active flexion to at least 155 degrees for improved overhead reach   Status Partially Met   CC Long Term Goal  #3   Title left shoulder active abduction to at least 160 degrees for improved ADLs   Status Partially Met   CC Long Term Goal  #4   Title will report at least 50% decrease in left shoulder pain in intensity and/or frequency   Status Achieved            Plan - 12/23/15 1559    Clinical Impression Statement  Patient has made gains in active flexion and abduction of left shoulder, and shoulder pain has decreased some 70% since starting therapy.  She is very pleased.     Pt will benefit from skilled therapeutic intervention in order to improve on the following deficits Decreased range of motion;Decreased strength;Impaired UE functional use;Pain   Rehab Potential Good   Clinical Impairments Affecting Rehab Potential h/o left shoulder problems   PT Frequency 1x / week   PT Duration 4 weeks   PT Treatment/Interventions Therapeutic exercise;Patient/family education;Passive range of motion;ADLs/Self Care Home Management   PT Next Visit Plan review supine scapular series; review Rockwood if needed; continue PROM.   PT Home Exercise Plan continue stretches she learned at [facility]; Rockwood and supine scapular vs. red Theraband   Consulted and Agree with Plan of Care Patient        Problem List Patient Active Problem List   Diagnosis Date Noted  . 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 Family history of breast cancer 09/22/2015  . Breast cancer of upper-inner quadrant of left female breast ([place]) 09/09/2015  . 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 Bradycardia 09/17/2014  . Edema 03/05/2013  . Obesity, Class II, BMI 35-39.9 06/23/2012  . Routine health maintenance 06/23/2012  . LOW BACK PAIN, ACUTE 04/22/2010  . ALLERGIC RHINITIS 08/12/2009  . Other specified disorders of bladder 06/03/2008  . KNEE PAIN, LEFT 06/03/2008  . 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 CHEST PAIN, ATYPICAL, HX OF 10/02/2007  . Diabetes type 2, controlled ([place]) 10/01/2007  . Hyperlipidemia 09/13/2007  . Essential hypertension 09/13/2007  . DEGENERATIVE JOINT DISEASE 09/13/2007  . BREAST CANCER, HX OF 09/13/2007    Bell,DONNA 12/23/2015, 4:03 PM  [place], [place], [address] Phone: [phone]   Fax:  [phone]  Name: Kelly Bell MRN: [id number] Date of Birth: [birth year]    Serafina Royals, PT 12/23/2015 4:03 PM

## 2015-12-25 NOTE — Telephone Encounter (Signed)
No entry 

## 2015-12-30 ENCOUNTER — Ambulatory Visit: Payer: Medicare Other | Admitting: Physical Therapy

## 2015-12-30 DIAGNOSIS — R29898 Other symptoms and signs involving the musculoskeletal system: Secondary | ICD-10-CM

## 2015-12-30 DIAGNOSIS — M25612 Stiffness of left shoulder, not elsewhere classified: Secondary | ICD-10-CM

## 2015-12-30 NOTE — Therapy (Signed)
[facility] [address] [place], [place], [address] Phone: [phone]   Fax:  [phone]  Physical Therapy Treatment  Patient Details  Name: Kelly Bell MRN: [id number] Date of Birth: 1941-03-20 Referring Provider: Dr. Tressa Busman  Encounter Date: 12/30/2015      PT End of Session - 12/30/15 2254    Visit Number 4   Number of Visits 9   Date for PT Re-Evaluation 02/08/16   PT Start Time 1519   PT Stop Time 1605   PT Time Calculation (min) 46 min   Activity Tolerance Patient tolerated treatment well   Behavior During Therapy [facility] for tasks assessed/performed      Past Medical History  Diagnosis Date  . Breast cancer ([facility])     Right  . Hyperlipidemia   . HTN (hypertension)   . Type II or unspecified type diabetes mellitus without mention of complication, not stated as uncontrolled   . Irritable bladder   . Knee pain     Bilateral    Past Surgical History  Procedure Laterality Date  . Mastectomy partial / lumpectomy w/ axillary lymphadenectomy    . Abdominal hysterectomy  1975  . Shoulder arthroscopy  2000  . Colonoscopy  05/10/05  . Mastectomy w/ sentinel node biopsy Left 10/28/2015    Procedure: LEFT MASTECTOMY WITH SENTINEL LYMPH NODE BIOPSY;  Surgeon: Almond Lint, MD;  Location: [facility];  Service: General;  Laterality: Left;    There were no vitals filed for this visit.  Visit Diagnosis:  Stiffness of joint, shoulder region, left  Weakness of left arm      Subjective Assessment - 12/30/15 1525    Subjective Doing okay, but left shoulder has been aggravated for a few days--is a little better today.   Currently in Pain? No/denies            [facility] PT Assessment - 12/30/15 0001    AROM   Left Shoulder Flexion 144 Degrees   Left Shoulder ABduction 160 Degrees                     [facility] Adult PT Treatment/Exercise - 12/30/15 0001    Shoulder Exercises: Supine    Other Supine Exercises supine scapular stabilization series:  narrow and wide grip with tension on red Theraband x 10 each; horizontal abduction vs. red x 10, diagonals x 10 each, bilateral ER vs. red x 10   Manual Therapy   Manual Therapy Myofascial release   Myofascial Release left UE myofascial pulling   Passive ROM left shoulder for IR, ER, abduction, and flexion to patient tolerance; also for horizontal abduction                        Long Term Clinic Goals - 12/30/15 2258    CC Long Term Goal  #1   Title Independent with HEP for left shoulder ROM and strengthening, including rotator cuff strengthening.   Status Partially Met   CC Long Term Goal  #2   Title left shoulder active flexion to at least 155 degrees for improved overhead reach   Status Partially Met   CC Long Term Goal  #3   Title left shoulder active abduction to at least 160 degrees for improved ADLs            Plan - 12/30/15 2254    Clinical Impression Statement Left shoulder has bothered her in the past several  days; she has not identified an aggravating factor, though it could be household chores.  She does not feel the exercises have been uncomfortable at home or in clinic, and says they did not aggravate her today.  AROM measurements of left shoulder have changed little this week.   Pt will benefit from skilled therapeutic intervention in order to improve on the following deficits Decreased range of motion;Decreased strength;Impaired UE functional use;Pain   Rehab Potential Good   Clinical Impairments Affecting Rehab Potential h/o left shoulder problems   PT Frequency 1x / week   PT Duration 4 weeks   PT Next Visit Plan continue P/AA/AROM of left shoulder;    PT Home Exercise Plan continue HEP   Consulted and Agree with Plan of Care Patient        Problem List Patient Active Problem List   Diagnosis Date Noted  . 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  Family history of breast cancer 09/22/2015  . Breast cancer of upper-inner quadrant of left female breast ([place]) 09/09/2015  . 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 Bradycardia 09/17/2014  . Edema 03/05/2013  . Obesity, Class II, BMI 35-39.9 06/23/2012  . Routine health maintenance 06/23/2012  . LOW BACK PAIN, ACUTE 04/22/2010  . ALLERGIC RHINITIS 08/12/2009  . Other specified disorders of bladder 06/03/2008  . KNEE PAIN, LEFT 06/03/2008  . 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 CHEST PAIN, ATYPICAL, HX OF 10/02/2007  . Diabetes type 2, controlled ([place]) 10/01/2007  . Hyperlipidemia 09/13/2007  . Essential hypertension 09/13/2007  . DEGENERATIVE JOINT DISEASE 09/13/2007  . BREAST CANCER, HX OF 09/13/2007    Kelly Bell 12/30/2015, 11:03 PM  [place], [place], [address] Phone: [phone]   Fax:  [phone]  Name: Kelly Bell MRN: [id number] Date of Birth: [birth year]    Serafina Royals, PT 12/30/2015 11:03 PM

## 2016-01-06 ENCOUNTER — Ambulatory Visit: Payer: Medicare Other | Admitting: Physical Therapy

## 2016-01-06 DIAGNOSIS — M25612 Stiffness of left shoulder, not elsewhere classified: Secondary | ICD-10-CM | POA: Diagnosis not present

## 2016-01-06 DIAGNOSIS — R6889 Other general symptoms and signs: Secondary | ICD-10-CM

## 2016-01-06 DIAGNOSIS — R29898 Other symptoms and signs involving the musculoskeletal system: Secondary | ICD-10-CM

## 2016-01-06 NOTE — Therapy (Signed)
[place], [place], [address] Phone: [phone]   Fax:  [phone]  Physical Therapy Treatment  Patient Details  Name: Kelly Bell MRN: [id number] Date of Birth: 1940/12/20 Referring Provider: Dr. Derry Skill  Encounter Date: 01/06/2016      PT End of Session - 01/06/16 2129    Visit Number 5   Number of Visits 9   Date for PT Re-Evaluation 02/08/16   PT Start Time 1525  therapist started appt. late   PT Stop Time 1600   PT Time Calculation (min) 35 min   Activity Tolerance Patient tolerated treatment well   Behavior During Therapy [facility] for tasks assessed/performed      Past Medical History  Diagnosis Date  . Breast cancer ([place])     Right  . Hyperlipidemia   . HTN (hypertension)   . Type II or unspecified type diabetes mellitus without mention of complication, not stated as uncontrolled   . Irritable bladder   . Knee pain     Bilateral    Past Surgical History  Procedure Laterality Date  . Mastectomy partial / lumpectomy w/ axillary lymphadenectomy    . Abdominal hysterectomy  1975  . Shoulder arthroscopy  2000  . Colonoscopy  05/10/05  . Mastectomy w/ sentinel node biopsy Left 10/28/2015    Procedure: LEFT MASTECTOMY WITH SENTINEL LYMPH NODE BIOPSY;  Surgeon: Stark Klein, MD;  Location: [place];  Service: General;  Laterality: Left;    There were no vitals filed for this visit.  Visit Diagnosis:  Stiffness of joint, shoulder region, left  Weakness of left arm  Impaired function of upper extremity      Subjective Assessment - 01/06/16 1527    Subjective Shoulder is doing better again.  Home exercises are going okay.  She is doing them.   Currently in Pain? No/denies            [facility] PT Assessment - 01/06/16 0001    Observation/Other Assessments   Quick DASH  9.09   AROM   Left Shoulder Flexion 149 Degrees   Left Shoulder ABduction 160 Degrees               Quick Dash - 01/06/16 0001    Open a tight or new jar No difficulty   Do heavy household chores (wash walls, wash floors) No difficulty   Carry a shopping bag or briefcase No difficulty   Wash your back No difficulty   Use a knife to cut food No difficulty   Recreational activities in which you take some force or impact through your arm, shoulder, or hand (golf, hammering, tennis) Mild difficulty   During the past week, to what extent has your arm, shoulder or hand problem interfered with your normal social activities with family, friends, neighbors, or groups? Slightly   During the past week, to what extent has your arm, shoulder or hand problem limited your work or other regular daily activities Slightly   Arm, shoulder, or hand pain. Mild   Tingling (pins and needles) in your arm, shoulder, or hand None   Difficulty Sleeping No difficulty   DASH Score 9.09 %               [facility] Adult PT Treatment/Exercise - 01/06/16 0001    Manual Therapy   Manual Therapy Scapular mobilization   Myofascial Release left UE myofascial pulling   Scapular Mobilization in right sidelying for left scapula  Passive ROM left shoulder for IR, ER, abduction, and flexion to patient tolerance; also for horizontal abduction                        Long Term Clinic Goals - 02-02-2016 1527    CC Long Term Goal  #1   Title Independent with HEP for left shoulder ROM and strengthening, including rotator cuff strengthening.   Status Achieved   CC Long Term Goal  #2   Title left shoulder active flexion to at least 155 degrees for improved overhead reach   Status Partially Met   CC Long Term Goal  #3   Title left shoulder active abduction to at least 160 degrees for improved ADLs   Status Achieved   CC Long Term Goal  #4   Title will report at least 50% decrease in left shoulder pain in intensity and/or frequency   Baseline More than 50% better.  "I can do some reaching  and it doesn't hurt."   Status Achieved            Plan - 2016-02-02 January 13, 2128    Clinical Impression Statement Patient is doing much better and feels ready for discharge.  Her shoulder feels much better.  She has not met left shoulder active flexion goal but has met all other goals.  Her quick DASH score has reduced from 50 to 9!   Pt will benefit from skilled therapeutic intervention in order to improve on the following deficits Decreased range of motion;Decreased strength;Impaired UE functional use;Pain   Clinical Impairments Affecting Rehab Potential h/o left shoulder problems   PT Treatment/Interventions Passive range of motion;Manual techniques   PT Next Visit Plan None; discharge   PT Home Exercise Plan continue HEP   Consulted and Agree with Plan of Care Patient          G-Codes - February 02, 2016 Jan 12, 2133    Functional Assessment Tool Used quick DASH   Functional Limitation Carrying, moving and handling objects   Carrying, Moving and Handling Objects Goal Status (M1962) At least 20 percent but less than 40 percent impaired, limited or restricted   Carrying, Moving and Handling Objects Discharge Status [phone]) At least 1 percent but less than 20 percent impaired, limited or restricted      Problem List Patient Active Problem List   Diagnosis Date Noted  . Carcinoma of left breast upper inner quadrant ([place]) 10/28/2015  . Family history of breast cancer 09/22/2015  . Breast cancer of upper-inner quadrant of left female breast ([place]) 09/09/2015  . 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 Bradycardia 09/17/2014  . Edema 03/05/2013  . Obesity, Class II, BMI 35-39.9 06/23/2012  . Routine health maintenance 06/23/2012  . LOW BACK PAIN, ACUTE 04/22/2010  . ALLERGIC RHINITIS 08/12/2009  . Other specified disorders of bladder 06/03/2008  . KNEE PAIN, LEFT 06/03/2008  . 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 CHEST PAIN, ATYPICAL, HX OF 10/02/2007  . Diabetes type 2, controlled ([place]) 10/01/2007  . Hyperlipidemia  09/13/2007  . Essential hypertension 09/13/2007  . DEGENERATIVE JOINT DISEASE 09/13/2007  . BREAST CANCER, HX OF 09/13/2007    SALISBURY,DONNA 02-02-16, 9:37 PM  [place], [place], [address] Phone: [phone]   Fax:  [phone]  Name: Kelly Bell MRN: [id number] Date of Birth: November 12, 1941    PHYSICAL THERAPY DISCHARGE SUMMARY  Visits from Start of Care: 5  Current functional level related to goals / functional outcomes: Goal  mostly met as noted above.   Remaining deficits: Still with AROM limitations left shoulder.   Education / Equipment: Left shoulder ROM and strengthening.  Plan: Patient agrees to discharge.  Patient goals were partially met. Patient is being discharged due to meeting the stated rehab goals.  ?????       Serafina Royals, PT 01/06/2016 9:37 PM

## 2016-01-07 ENCOUNTER — Other Ambulatory Visit: Payer: Self-pay | Admitting: Family

## 2016-01-31 ENCOUNTER — Encounter: Payer: Self-pay | Admitting: Family

## 2016-02-02 ENCOUNTER — Encounter: Payer: Medicare Other | Admitting: Nurse Practitioner

## 2016-02-02 MED ORDER — NABUMETONE 500 MG PO TABS: ORAL_TABLET | ORAL | Status: DC

## 2016-02-02 MED ORDER — HYDROCHLOROTHIAZIDE 25 MG PO TABS: ORAL_TABLET | ORAL | Status: DC

## 2016-02-16 ENCOUNTER — Telehealth: Payer: Self-pay | Admitting: Nurse Practitioner

## 2016-02-16 ENCOUNTER — Ambulatory Visit (HOSPITAL_BASED_OUTPATIENT_CLINIC_OR_DEPARTMENT_OTHER): Payer: Medicare Other | Admitting: Nurse Practitioner

## 2016-02-16 ENCOUNTER — Encounter: Payer: Self-pay | Admitting: Nurse Practitioner

## 2016-02-16 VITALS — BP 159/80 | HR 56 | Temp 98.6°F | Resp 17 | Ht 65.0 in | Wt 248.3 lb

## 2016-02-16 DIAGNOSIS — Z86000 Personal history of in-situ neoplasm of breast: Secondary | ICD-10-CM

## 2016-02-16 DIAGNOSIS — R5383 Other fatigue: Secondary | ICD-10-CM | POA: Diagnosis not present

## 2016-02-16 DIAGNOSIS — C50212 Malignant neoplasm of upper-inner quadrant of left female breast: Secondary | ICD-10-CM

## 2016-02-16 DIAGNOSIS — R030 Elevated blood-pressure reading, without diagnosis of hypertension: Secondary | ICD-10-CM

## 2016-02-16 DIAGNOSIS — Z79810 Long term (current) use of selective estrogen receptor modulators (SERMs): Secondary | ICD-10-CM

## 2016-02-16 DIAGNOSIS — I10 Essential (primary) hypertension: Secondary | ICD-10-CM

## 2016-02-16 NOTE — Telephone Encounter (Signed)
Received call from patient upon return home.  Took blood pressure medications.  Rechecked blood pressure and reports reading of 127/80.

## 2016-02-16 NOTE — Progress Notes (Signed)
CLINIC:  Cancer Survivorship   REASON FOR VISIT:  Routine follow-up post-treatment for a recent history of breast cancer.  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   08/25/2015 Mammogram Left breast: new cluster of calcifications upper inner quadrant, posterior depth   09/01/2015 Initial Biopsy Left breast core needle bx: DCIS, low grade, 4 cm, ER/PR+   09/14/2015 Breast MRI 4 x 10 x 3 cm area of irregular linear nodular enhancement within the upper inner left breast    09/14/2015 Clinical Stage Stage 0: Tis N0   10/28/2015 Definitive Surgery Left mastectomy/SLNB: DCIS, grade 1, 3.5 cm, 3 LN negative   10/28/2015 Pathologic Stage Stage 0: Tis N0    Procedure Genetics testing declined   12/02/2015 -  Anti-estrogen oral therapy Tamoxifen 20 mg daily    INTERVAL HISTORY:  Kelly Bell presents to the [facility] today for our initial meeting to review her survivorship care plan detailing her treatment course for breast cancer, as well as monitoring long-term side effects of that treatment, education regarding health maintenance, screening, and overall wellness and health promotion.     Overall, Kelly Bell reports feeling fairly well since her surgery other than continued fatigue.  She is well healed and has had no swelling.  She is taking her tamoxifen and is having significant hot flashes that are very bothersome to her.  She denies any chest pain, calf pain, swelling or shortness of breath.  Her biggest complaint today is the increased stress she is under with the very recent (this morning) death of a cousin and her family demands placed upon her.  Her blood pressure is elevated today.  She has no headache, dizziness, or visual changes. She states that she rushed out of the house this  morning without taking her antihypertensives.  She has a good appetite and denies any weight loss.    REVIEW OF SYSTEMS:  General: Hot flashes and fatigue, as above. Denies fever, chills, unintentional weight loss, or night sweats. HEENT: Denies visual changes, hearing loss, mouth sores or difficulty swallowing. Cardiac: Denies palpitations, chest pain, and lower extremity edema.  Respiratory: Denies wheeze or dyspnea on exertion.  Breast: Denies any new nodularity, masses, tenderness, nipple changes, or nipple discharge in opposite breast or changes along her mastectomy incision.  GI: Denies abdominal pain, constipation, diarrhea, nausea, or vomiting.  GU: Denies dysuria, hematuria, vaginal bleeding, vaginal discharge, or vaginal dryness.  Musculoskeletal: Denies joint or bone pain.  Neuro: Denies recent fall or numbness / tingling in her extremities. Skin: Denies rash, pruritis, or open wounds.  Psych: Mild anxiety due to family demands. Denies depression, insomnia, or memory loss.   A 14-point review of systems was completed and was negative, except as noted above.   ONCOLOGY TREATMENT TEAM:  1. Surgeon:  Dr. Barry Dienes at [facility]  2. Medical Oncologist: Dr. Jana Hakim     PAST MEDICAL/SURGICAL HISTORY:  Past Medical History  Diagnosis Date  . Breast cancer ([place])     Right  . Hyperlipidemia   . HTN (hypertension)   . Type II or unspecified type diabetes mellitus without mention of complication, not stated as uncontrolled   . Irritable bladder   . Knee pain     Bilateral   Past Surgical History  Procedure Laterality Date  .  Mastectomy partial / lumpectomy w/ axillary lymphadenectomy    . Abdominal hysterectomy  1975  . Shoulder arthroscopy  2000  . Colonoscopy  05/10/05  . Mastectomy w/ sentinel node biopsy Left 10/28/2015    Procedure: LEFT MASTECTOMY WITH SENTINEL LYMPH NODE BIOPSY;  Surgeon: Stark Klein, MD;  Location: [place];  Service:  General;  Laterality: Left;     ALLERGIES:  Allergies  Allergen Reactions  . Pravastatin Other (See Comments)    Severe muscle cramps.  . Lisinopril     cough     CURRENT MEDICATIONS:  Current Outpatient Prescriptions on File Prior to Visit  Medication Sig Dispense Refill  . ACCU-CHEK AVIVA PLUS test strip USE TO CHECK BLOOD SUGAR ONCE DAILY 100 each 1  . aspirin 325 MG tablet Take 325 mg by mouth daily.      . Calcium Carbonate-Vitamin D (CALTRATE 600+D) 600-400 MG-UNIT per tablet Take 1 tablet by mouth 2 (two) times daily.    . fluticasone (FLONASE) 50 MCG/ACT nasal spray USE TWO SPRAY IN EACH NOSTRIL ONCE DAILY 48 g 1  . hydrochlorothiazide (HYDRODIURIL) 25 MG tablet TAKE 1/2 TABLET BY MOUTH EVERY DAY 15 tablet 0  . losartan (COZAAR) 50 MG tablet TAKE ONE TABLET BY MOUTH ONCE DAILY 90 tablet 0  . methocarbamol (ROBAXIN) 500 MG tablet Take 1 tablet (500 mg total) by mouth every 6 (six) hours as needed for muscle spasms. (Patient not taking: Reported on 12/09/2015) 30 tablet 0  . metoprolol succinate (TOPROL-XL) 25 MG 24 hr tablet Take 1 tablet by mouth  daily 90 tablet 1  . nabumetone (RELAFEN) 500 MG tablet TAKE ONE TABLET BY MOUTH TWICE DAILY as needed AFTER A MEAL FOR ARTHRITIS 60 tablet 0  . oxybutynin (DITROPAN) 5 MG tablet Take 1 tablet by mouth 4  times daily as needed 360 tablet 1  . oxyCODONE (OXY IR/ROXICODONE) 5 MG immediate release tablet Take 1-2 tablets (5-10 mg total) by mouth every 4 (four) hours as needed for moderate pain. (Patient not taking: Reported on 12/09/2015) 30 tablet 0  . pioglitazone (ACTOS) 30 MG tablet Take 30 mg by mouth daily.    . tamoxifen (NOLVADEX) 20 MG tablet Take 1 tablet (20 mg total) by mouth daily. (Patient not taking: Reported on 12/09/2015) 90 tablet 12  . [DISCONTINUED] oxybutynin (DITROPAN) 5 MG tablet TAKE ONE TABLET BY MOUTH 4 TIMES DAILY AS NEEDED 120 tablet 5   No current facility-administered medications on file prior to visit.      ONCOLOGIC FAMILY HISTORY:  Family History  Problem Relation Age of Onset  . Diabetes Mother 21    Deceased  . Pancreatic cancer Mother     dx. early 32s  . Diabetes Father 13    Deceased  . Heart failure Father   . Heart disease Father     CHF  . Breast cancer Sister 67  . Cancer Brother     oral cancer - metastasis to lungs; smoker  . Corneal Dystrophy Son     bilateral  . Healthy Daughter     x1  . Breast cancer Sister     dx. 74s  . Cancer Cousin     oral cancer dx. <60; +EtOH, smoker; daughter of father's paternal half-brother     GENETIC COUNSELING/TESTING: No (pt declined)   SOCIAL HISTORY:  JACILYN HAAR is married and lives with her spouse in [place], [place].  She has 2 children. 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  She denies any current or history of tobacco, alcohol, or illicit drug use.     PHYSICAL EXAMINATION:  Vital Signs: Filed Vitals:   02/16/16 1056 02/16/16 1130  BP: 196/89 159/80  Pulse: 56   Temp: 98.6 F (37 C)   Resp: 17    ECOG Performance Status: 0  General: Well-nourished, well-appearing female in no acute distress.  She is unaccompanied in clinic today.   HEENT: Head is atraumatic and normocephalic.  Pupils equal and reactive to light and accomodation. Conjunctivae clear without exudate.  Sclerae anicteric. Oral mucosa is pink, moist, and intact without lesions.  Oropharynx is pink without lesions or erythema.  Lymph: No cervical, supraclavicular, infraclavicular, or axillary lymphadenopathy noted on palpation.  Cardiovascular: Regular rate and rhythm without murmurs, rubs, or gallops. Respiratory: Clear to auscultation bilaterally. Chest expansion symmetric without accessory muscle use on inspiration or expiration.  GI: Abdomen soft and round. No tenderness to palpation. Bowel sounds normoactive in 4 quadrants.  GU: Deferred.  Neuro: No focal deficits. Steady gait.  Psych: Mood and  affect normal and appropriate for situation.  Extremities: No edema, cyanosis, or clubbing.  Skin: Warm and dry. No open lesions noted.   LABORATORY DATA:  None for this visit.  DIAGNOSTIC IMAGING:  None for this visit.     ASSESSMENT AND PLAN:   1. 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  Kelly Bell is doing well without clinical symptoms worrisome for disease recurrence. She will follow-up with Gentry Fitz NP in May 2017 with history and physical examination per surveillance protocol.  She is considering discontinuing her tamoxifen due to the hot flashes.  She states that she discussed this with Dr. Jana Hakim prior to beginning therapy, as she had taken tamoxifen in the past with her prior cancer diagnosis and was concerned regarding side effects.  I have encouraged her to discuss this further with Gentry Fitz when she sees her next month, or sooner if they become unbearable.  We have discussed that we have medications to aid in the management of the hot flashes, if she is interested. A comprehensive survivorship care plan and treatment summary was reviewed with the patient today detailing her breast cancer diagnosis, treatment course, potential late/long-term effects of treatment, appropriate follow-up care with recommendations for the future, and patient education resources.  A copy of this summary, along with a letter will be sent to the patient's primary care provider via in basket message after today's visit.  Kelly Bell is welcome to return to the Survivorship Clinic in the future, as needed; no follow-up will be scheduled at this time.    2. Elevated blood pressure: Upon presentation to clinic today, Kelly Bell' blood pressure was 196/89.  She shares that she just learned of her cousin's death this morning and ran out  of the house for this appointment without taking her medication.  After sitting during our visit, I repeated her blood pressure and it returned at 156/90.  She is asymptomatic, as above, and is headed home following today's visit to take her medication. She has a blood pressure cuff at home and states that she will check it this afternoon and call this provider with those results.    3. Cancer screening:  Due to Kelly Bell's history and her age, she should receive screening for  skin cancers and colon cancer. She is due colonoscopy this year. The information and recommendations are listed on the patient's comprehensive care plan/treatment summary and were reviewed in detail with the patient.    4. Health maintenance and wellness promotion: Kelly Bell was encouraged to increase her intake of fruits, vegetables, lean proteins, and minimize the intake of red meats and processed foods.  She was encouraged to engage in moderate to vigorous exercise for 30 minutes per day most days of the week. We discussed the [employer] fitness program, which is designed for cancer survivors to help them become more physically fit after cancer treatments.  She was instructed to limit her alcohol consumption and continue to abstain from tobacco use. A copy of the "Take Control of Your Health" brochure was given to her reinforcing these recommendations.   5. Support services/counseling: It is not uncommon for this period of the patient's cancer care trajectory to be one of many emotions and stressors. In addition, Kelly Bell recently lost her cousin (as above). We discussed an opportunity for her to participate in the next session of [facility] ("Finding Your New Normal") support group series designed for patients after they have completed treatment.  Kelly Bell was encouraged to take advantage of our many other support services programs, support groups, and/or counseling in coping with her new life as a cancer survivor after  completing anti-cancer treatment.  She was offered support today through active listening and expressive supportive counseling.  She was given information regarding our available services and encouraged to contact me with any questions or for help enrolling in any of our support group/programs.    A total of 35 minutes of face-to-face time was spent with this patient with greater than 50% of that time in counseling and care-coordination.   Sylvan Cheese, NP  Survivorship Program [facility] [phone]   Note: PRIMARY CARE PROVIDER Nance Pear., NP [phone]

## 2016-03-09 ENCOUNTER — Encounter: Payer: Self-pay | Admitting: Family

## 2016-03-09 ENCOUNTER — Other Ambulatory Visit: Payer: Self-pay | Admitting: Family

## 2016-03-09 ENCOUNTER — Ambulatory Visit (INDEPENDENT_AMBULATORY_CARE_PROVIDER_SITE_OTHER): Payer: Medicare Other | Admitting: Family

## 2016-03-09 VITALS — BP 138/78 | HR 94 | Temp 97.8°F | Resp 16 | Ht 60.0 in | Wt 244.6 lb

## 2016-03-09 DIAGNOSIS — I10 Essential (primary) hypertension: Secondary | ICD-10-CM | POA: Diagnosis not present

## 2016-03-09 DIAGNOSIS — E119 Type 2 diabetes mellitus without complications: Secondary | ICD-10-CM

## 2016-03-09 DIAGNOSIS — N3281 Overactive bladder: Secondary | ICD-10-CM

## 2016-03-09 DIAGNOSIS — E785 Hyperlipidemia, unspecified: Secondary | ICD-10-CM | POA: Diagnosis not present

## 2016-03-09 LAB — BASIC METABOLIC PANEL
BUN: 12 mg/dL (ref 6–23)
CALCIUM: 10 mg/dL (ref 8.4–10.5)
CO2: 29 meq/L (ref 19–32)
Chloride: 103 mEq/L (ref 96–112)
Creatinine, Ser: 0.81 mg/dL (ref 0.40–1.20)
GFR: 88.38 mL/min (ref >60.00)
Glucose, Bld: 114 mg/dL — ABNORMAL HIGH (ref 70–99)
POTASSIUM: 3.9 meq/L (ref 3.5–5.1)
SODIUM: 139 meq/L (ref 135–145)

## 2016-03-09 LAB — LIPID PANEL
Cholesterol: 239 mg/dL — ABNORMAL HIGH (ref 0–200)
HDL: 39.8 mg/dL (ref >39.00)
LDL Cholesterol: 166 mg/dL — ABNORMAL HIGH (ref 0–99)
NonHDL: 199.35
TRIGLYCERIDES: 166 mg/dL — AB (ref 0.0–149.0)
Total CHOL/HDL Ratio: 6
VLDL: 33.2 mg/dL (ref 0.0–40.0)

## 2016-03-09 LAB — HEMOGLOBIN A1C: HEMOGLOBIN A1C: 6.3 % (ref 4.6–6.5)

## 2016-03-09 MED ORDER — HYDROCHLOROTHIAZIDE 25 MG PO TABS: ORAL_TABLET | ORAL | Status: DC

## 2016-03-09 MED ORDER — LOSARTAN POTASSIUM 50 MG PO TABS: 50.0000 mg | ORAL_TABLET | Freq: Every day | ORAL | Status: DC

## 2016-03-09 MED ORDER — MIRABEGRON ER 25 MG PO TB24: 25.0000 mg | ORAL_TABLET | Freq: Every day | ORAL | Status: DC

## 2016-03-09 NOTE — Assessment & Plan Note (Signed)
BP stable on current medications. Continue same.  

## 2016-03-09 NOTE — Assessment & Plan Note (Signed)
Obtain follow up lipid panel.

## 2016-03-09 NOTE — Progress Notes (Signed)
Pre visit review using our clinic review tool, if applicable. No additional management support is needed unless otherwise documented below in the visit note. 

## 2016-03-09 NOTE — Telephone Encounter (Signed)
Please let pt know that sugar looks good but I still think she needs actos ( I sent her a my chart to continue ditropan since myrbetriq too expensive).  Cholesterol is high. I would recommend that she begin zetia for her cholesterol along with low fat/low cholesterol diet.

## 2016-03-09 NOTE — Assessment & Plan Note (Signed)
Stable on actos. Continue same.  Lab Results  Component Value Date   HGBA1C 6.3 03/09/2016

## 2016-03-09 NOTE — Assessment & Plan Note (Signed)
Since symptoms have not been as well controlled, attempted to change to myrbetriq but cost was too high. Continue ditropan. Consider referral to urology if symptoms worsen.

## 2016-03-09 NOTE — Progress Notes (Signed)
Subjective:    Patient ID: Kelly Bell, female    DOB: 11-05-1941, 75 y.o.   MRN: [id number]  HPI  Kelly Bell is a 75 yr old female who presents today for follow up.  1) HTN- on hctz and toprol, losartan BP Readings from Last 3 Encounters:  03/09/16 138/78  02/16/16 159/80  12/02/15 160/64   2) Hyperlipidemia- intolerant to statins.   Lab Results  Component Value Date   CHOL 198 08/07/2015   HDL 36.60* 08/07/2015   LDLCALC 127* 08/07/2015   LDLDIRECT 111.5 01/01/2007   TRIG 169.0* 08/07/2015   CHOLHDL 5 08/07/2015   3) Dm2- currenlty on actos 30mg . Reports that her sugars have been good.  Generally around 100.   Lab Results  Component Value Date   HGBA1C 5.7 08/07/2015   HGBA1C 5.6 04/06/2015   HGBA1C 6.1 12/23/2014   Lab Results  Component Value Date   MICROALBUR 0.7 08/07/2015   LDLCALC 127* 08/07/2015   CREATININE 0.71 10/26/2015   4) overactive bladder- on ditropan. Reports that it is not helping as much as it used to.  Review of Systems  Respiratory: Negative for shortness of breath.   Cardiovascular: Negative for chest pain and leg swelling.   Past Medical History  Diagnosis Date  . Breast cancer ([place])     Right  . Hyperlipidemia   . HTN (hypertension)   . Type II or unspecified type diabetes mellitus without mention of complication, not stated as uncontrolled   . Irritable bladder   . Knee pain     Bilateral     Social History   Social History  . Marital Status: Married    Spouse Name: N/A  . Number of Children: 2  . Years of Education: 12   Occupational History  . 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 Smoking status: Never Smoker   . Smokeless tobacco: Never Used  . Alcohol Use: No  . Drug Use: No  . Sexual Activity: Not Currently   Other Topics Concern  . Not on file   Social History Narrative   HSG. Married 1965. 1 son - '68, 1 daughter-'72; 3 grandsons. Retired - mfg,  Patent examiner. SO - pretty good health.                Past Surgical History  Procedure Laterality Date  . Mastectomy partial / lumpectomy w/ axillary lymphadenectomy    . Abdominal hysterectomy  1975  . Shoulder arthroscopy  2000  . Colonoscopy  05/10/05  . Mastectomy w/ sentinel node biopsy Left 10/28/2015    Procedure: LEFT MASTECTOMY WITH SENTINEL LYMPH NODE BIOPSY;  Surgeon: Stark Klein, MD;  Location: [facility];  Service: General;  Laterality: Left;    Family History  Problem Relation Age of Onset  . Diabetes Mother 82    Deceased  . Pancreatic cancer Mother     dx. early 35s  . Diabetes Father 2    Deceased  . Heart failure Father   . Heart disease Father     CHF  . Breast cancer Sister 20  . Cancer Brother     oral cancer - metastasis to lungs; smoker  . Corneal Dystrophy Son     bilateral  . Healthy Daughter     x1  . Breast cancer Sister     dx. 14s  . Cancer Cousin     oral cancer  dx. <60; +EtOH, smoker; daughter of father's paternal half-brother    Allergies  Allergen Reactions  . Pravastatin Other (See Comments)    Severe muscle cramps.  . Lisinopril     cough    Current Outpatient Prescriptions on File Prior to Visit  Medication Sig Dispense Refill  . ACCU-CHEK AVIVA PLUS test strip USE TO CHECK BLOOD SUGAR ONCE DAILY 100 each 1  . aspirin 325 MG tablet Take 325 mg by mouth daily.      . Calcium Carbonate-Vitamin D (CALTRATE 600+D) 600-400 MG-UNIT per tablet Take 1 tablet by mouth 2 (two) times daily.    . fluticasone (FLONASE) 50 MCG/ACT nasal spray USE TWO SPRAY IN EACH NOSTRIL ONCE DAILY 48 g 1  . methocarbamol (ROBAXIN) 500 MG tablet Take 1 tablet (500 mg total) by mouth every 6 (six) hours as needed for muscle spasms. 30 tablet 0  . metoprolol succinate (TOPROL-XL) 25 MG 24 hr tablet Take 1 tablet by mouth  daily 90 tablet 1  . nabumetone (RELAFEN) 500 MG tablet TAKE ONE TABLET BY MOUTH TWICE DAILY as needed AFTER A MEAL  FOR ARTHRITIS 60 tablet 0  . pioglitazone (ACTOS) 30 MG tablet Take 30 mg by mouth daily.    . tamoxifen (NOLVADEX) 20 MG tablet Take 1 tablet (20 mg total) by mouth daily. 90 tablet 12  . [DISCONTINUED] oxybutynin (DITROPAN) 5 MG tablet TAKE ONE TABLET BY MOUTH 4 TIMES DAILY AS NEEDED 120 tablet 5   No current facility-administered medications on file prior to visit.    BP 138/78 mmHg  Pulse 94  Temp(Src) 97.8 F (36.6 C) (Oral)  Resp 16  Ht 5' (1.524 m)  Wt 244 lb 9.6 oz (110.95 kg)  BMI 47.77 kg/m2  SpO2 97%       Objective:   Physical Exam  Constitutional: She appears well-developed and well-nourished.  Cardiovascular: Normal rate, regular rhythm and normal heart sounds.   No murmur heard. Pulmonary/Chest: Effort normal and breath sounds normal. No respiratory distress. She has no wheezes.  Psychiatric: She has a normal mood and affect. Her behavior is normal. Judgment and thought content normal.          Assessment & Plan:

## 2016-03-09 NOTE — Patient Instructions (Signed)
Stop ditropan, begin myrbetriq for overactive bladder. Please complete lab work prior to leaving.

## 2016-03-10 NOTE — Telephone Encounter (Signed)
LMOM for patient to return call regarding below.

## 2016-03-11 MED ORDER — EZETIMIBE 10 MG PO TABS: 10.0000 mg | ORAL_TABLET | Freq: Every day | ORAL | Status: DC

## 2016-03-11 NOTE — Telephone Encounter (Signed)
Pt returned my call this afternoon but I was unavailable to take the call. Attempted to reach pt and left message that I will send her a mychart message and to let us know if she has any questions.

## 2016-03-31 ENCOUNTER — Telehealth: Payer: Self-pay | Admitting: Nurse Practitioner

## 2016-03-31 ENCOUNTER — Ambulatory Visit (HOSPITAL_BASED_OUTPATIENT_CLINIC_OR_DEPARTMENT_OTHER): Payer: Medicare Other | Admitting: Nurse Practitioner

## 2016-03-31 ENCOUNTER — Encounter: Payer: Self-pay | Admitting: Nurse Practitioner

## 2016-03-31 ENCOUNTER — Other Ambulatory Visit: Payer: Self-pay | Admitting: Family

## 2016-03-31 VITALS — BP 130/84 | HR 74 | Temp 98.6°F | Resp 18 | Wt 244.8 lb

## 2016-03-31 DIAGNOSIS — D0512 Intraductal carcinoma in situ of left breast: Secondary | ICD-10-CM

## 2016-03-31 DIAGNOSIS — M859 Disorder of bone density and structure, unspecified: Secondary | ICD-10-CM | POA: Diagnosis not present

## 2016-03-31 DIAGNOSIS — Z853 Personal history of malignant neoplasm of breast: Secondary | ICD-10-CM | POA: Diagnosis not present

## 2016-03-31 DIAGNOSIS — C50212 Malignant neoplasm of upper-inner quadrant of left female breast: Secondary | ICD-10-CM

## 2016-03-31 NOTE — Progress Notes (Signed)
[place]  Telephone:(336) [phone] Fax:(336) [phone]     ID: Kelly Bell DOB: 1941-04-21  MR#: [id number]  [id number]  Patient Care Team: Debbrah Alar, NP as PCP - General (Internal Medicine) Marybelle Killings, MD (Orthopedic Surgery) Stark Klein, MD as Consulting Physician (General Surgery) Chauncey Cruel, MD as Consulting Physician (Oncology) Kyung Rudd, MD as Consulting Physician (Radiation Oncology) Rockwell Germany, RN as Registered Nurse Mauro Kaufmann, RN as Registered Nurse Sylvan Cheese, NP as Nurse Practitioner (Hematology and Oncology) PCP: Nance Pear., NP OTHER MD: Rodell Perna MD  CHIEF COMPLAINT: Estrogen receptor positive breast cancer  CURRENT TREATMENT:    BREAST CANCER HISTORY: From the original intake note:  Kelly Bell had a right lumpectomy in 1987 for which she describes as a "tiny" tumor which was lymph node negative. She had adjuvant radiation and then 5 years of tamoxifen under the care of Dr. Sonny Dandy.  More recently, bilateral screening mammography with tomosynthesis 08/25/2015 at [facility] found a new cluster of calcifications in the upper inner quadrant of the left breast. Left diagnostic mammography 08/31/2015 showed the breast composition to be category C. There was a suspicious area of calcifications in the left breast upper inner quadrant measuring 4 cm. This was biopsied 09/01/2015, and showed (SAA [id number]) ductal carcinoma in situ, low-grade, involving a papilloma. The estrogen receptor was under percent positive. The progesterone receptor was under percent positive, both with strong staining intensity.  The patient's subsequent history is as detailed below.   INTERVAL HISTORY: Kelly Bell returns today for follow-up of her ductal carcinoma in situ. She was started on tamoxifen earlier this year, but stopped the drug sometime in April because of increased hot flashes. She denies vaginal changes while on this drug. The interval  history is generally unremarkable.  REVIEW OF SYSTEMS: Shianne has few complaints to offer today. Her left mastectomy site has healed well. Of course she has occasional headaches, and she has arthritis pains here in there which are not more frequent or intense than prior. A detailed review of systems is otherwise stable.  PAST MEDICAL HISTORY: Past Medical History  Diagnosis Date  . Breast cancer ([place])     Right  . Hyperlipidemia   . HTN (hypertension)   . Type II or unspecified type diabetes mellitus without mention of complication, not stated as uncontrolled   . Irritable bladder   . Knee pain     Bilateral    PAST SURGICAL HISTORY: Past Surgical History  Procedure Laterality Date  . Mastectomy partial / lumpectomy w/ axillary lymphadenectomy    . Abdominal hysterectomy  1975  . Shoulder arthroscopy  2000  . Colonoscopy  05/10/05  . Mastectomy w/ sentinel node biopsy Left 10/28/2015    Procedure: LEFT MASTECTOMY WITH SENTINEL LYMPH NODE BIOPSY;  Surgeon: Stark Klein, MD;  Location: [facility];  Service: General;  Laterality: Left;    FAMILY HISTORY Family History  Problem Relation Age of Onset  . Diabetes Mother 77    Deceased  . Pancreatic cancer Mother     dx. early 72s  . Diabetes Father 79    Deceased  . Heart failure Father   . Heart disease Father     CHF  . Breast cancer Sister 20  . Cancer Brother     oral cancer - metastasis to lungs; smoker  . Corneal Dystrophy Son     bilateral  . Healthy Daughter     x1  . Breast cancer Sister  dx. 60s  . Cancer Cousin     oral cancer dx. <60; +EtOH, smoker; daughter of father's paternal half-brother   the patient's father died at the age of 65 from non-cancer related causes. The patient's mother died at the age of 86, from pancreatic cancer. The patient had 4 brothers, 4 sisters. One sister died from breast cancer at the age of 13. One brother died from cancer of the head and neck. A second sister has  been diagnosed recently with breast cancer. She is in her 50s. There is no history of ovarian cancer in the family  GYNECOLOGIC HISTORY:  No LMP recorded. Patient has had a hysterectomy. Menarche age 6, first live birth age 75, the patient is Kelly Bell. She underwent hysterectomy without salpingo-oophorectomy in 1975. She did not take hormone replacement. She took birth controls for some period of time in the past but does not recall how long. 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 Her husband Kelly Bell is a retired Chemical engineer. He used to work as a Company secretary as well. The patient's son Kelly Bell is disabled and lives in [place]. The patient's daughter Kelly Bell also lives in [place]. She is a Theatre manager. The patient has 3 grandchildren. She attends a local [employer]: Not in place   HEALTH MAINTENANCE: Social History  Substance Use Topics  . Smoking status: Never Smoker   . Smokeless tobacco: Never Used  . Alcohol Use: No     Colonoscopy: 2011/lobe our  PAP:  Bone density: 10/16/2014/[place]/D- 1.5 (osteopenia)  Lipid panel:  Allergies  Allergen Reactions  . Pravastatin Other (See Comments)    Severe muscle cramps.  . Lisinopril     cough    Current Outpatient Prescriptions  Medication Sig Dispense Refill  . ACCU-CHEK AVIVA PLUS test strip USE TO CHECK BLOOD SUGAR ONCE DAILY 100 each 1  . aspirin 325 MG tablet Take 325 mg by mouth daily.      . Calcium Carbonate-Vitamin D (CALTRATE 600+D) 600-400 MG-UNIT per tablet Take 1 tablet by mouth 2 (two) times daily.    Marland Kitchen ezetimibe (ZETIA) 10 MG tablet Take 1 tablet (10 mg total) by mouth daily. 30 tablet 3  . hydrochlorothiazide (HYDRODIURIL) 25 MG tablet TAKE 1/2 TABLET BY MOUTH EVERY DAY 45 tablet 1  . losartan (COZAAR) 50 MG tablet Take 1 tablet (50 mg total) by mouth daily. 90 tablet 1  . metoprolol succinate  (TOPROL-XL) 25 MG 24 hr tablet Take 1 tablet by mouth  daily 90 tablet 1  . nabumetone (RELAFEN) 500 MG tablet TAKE ONE TABLET BY MOUTH TWICE DAILY as needed AFTER A MEAL FOR ARTHRITIS 60 tablet 0  . pioglitazone (ACTOS) 30 MG tablet Take 30 mg by mouth daily.    . fluticasone (FLONASE) 50 MCG/ACT nasal spray USE TWO SPRAY IN EACH NOSTRIL ONCE DAILY (Patient not taking: Reported on 03/31/2016) 48 g 1  . tamoxifen (NOLVADEX) 20 MG tablet Take 1 tablet (20 mg total) by mouth daily. (Patient not taking: Reported on 03/31/2016) 90 tablet 12  . [DISCONTINUED] oxybutynin (DITROPAN) 5 MG tablet TAKE ONE TABLET BY MOUTH 4 TIMES DAILY AS NEEDED 120 tablet 5   No current facility-administered medications for this visit.    OBJECTIVE: Older African-American woman in no acute distress Filed Vitals:   03/31/16 1320  BP: 130/84  Pulse: 74  Temp: 98.6  F (37 C)  Resp: 18     Body mass index is 47.81 kg/(m^2).    ECOG FS:1 - Symptomatic but completely ambulatory  Skin: warm, dry  HEENT: sclerae anicteric, conjunctivae pink, oropharynx clear. No thrush or mucositis.  Lymph Nodes: No cervical or supraclavicular lymphadenopathy  Lungs: clear to auscultation bilaterally, no rales, wheezes, or rhonci  Heart: regular rate and rhythm  Abdomen: round, soft, non tender, positive bowel sounds  Musculoskeletal: No focal spinal tenderness, right upper extremity lymphedema  Neuro: non focal, well oriented, positive affect  Breasts: deferred   LAB RESULTS:  CMP     Component Value Date/Time   NA 139 03/09/2016 1126   NA 140 09/09/2015 1209   K 3.9 03/09/2016 1126   K 4.3 09/09/2015 1209   CL 103 03/09/2016 1126   CO2 29 03/09/2016 1126   CO2 26 09/09/2015 1209   GLUCOSE 114* 03/09/2016 1126   GLUCOSE 123 09/09/2015 1209   BUN 12 03/09/2016 1126   BUN 15.2 09/09/2015 1209   CREATININE 0.81 03/09/2016 1126   CREATININE 0.9 09/09/2015 1209   CREATININE 0.76 04/17/2014 1115   CALCIUM 10.0 03/09/2016  1126   CALCIUM 10.3 09/09/2015 1209   PROT 7.3 09/09/2015 1209   PROT 6.3 04/17/2014 1115   ALBUMIN 4.1 09/09/2015 1209   ALBUMIN 4.0 04/17/2014 1115   AST 20 09/09/2015 1209   AST 17 04/17/2014 1115   ALT 20 09/09/2015 1209   ALT 15 04/17/2014 1115   ALKPHOS 59 09/09/2015 1209   ALKPHOS 44 04/17/2014 1115   BILITOT 0.66 09/09/2015 1209   BILITOT 0.5 04/17/2014 1115   GFRNONAA >60 10/26/2015 1000   GFRNONAA 78 04/17/2014 1115   GFRAA >60 10/26/2015 1000   GFRAA 89 04/17/2014 1115    INo results found for: SPEP, UPEP  Lab Results  Component Value Date   WBC 3.2* 09/09/2015   NEUTROABS 1.6 09/09/2015   HGB 14.0 09/09/2015   HCT 42.5 09/09/2015   MCV 94.2 09/09/2015   PLT 197 09/09/2015      Chemistry      Component Value Date/Time   NA 139 03/09/2016 1126   NA 140 09/09/2015 1209   K 3.9 03/09/2016 1126   K 4.3 09/09/2015 1209   CL 103 03/09/2016 1126   CO2 29 03/09/2016 1126   CO2 26 09/09/2015 1209   BUN 12 03/09/2016 1126   BUN 15.2 09/09/2015 1209   CREATININE 0.81 03/09/2016 1126   CREATININE 0.9 09/09/2015 1209   CREATININE 0.76 04/17/2014 1115      Component Value Date/Time   CALCIUM 10.0 03/09/2016 1126   CALCIUM 10.3 09/09/2015 1209   ALKPHOS 59 09/09/2015 1209   ALKPHOS 44 04/17/2014 1115   AST 20 09/09/2015 1209   AST 17 04/17/2014 1115   ALT 20 09/09/2015 1209   ALT 15 04/17/2014 1115   BILITOT 0.66 09/09/2015 1209   BILITOT 0.5 04/17/2014 1115       No results found for: LABCA2  No components found for: LABCA125  No results for input(s): INR in the last 168 hours.  Urinalysis    Component Value Date/Time   COLORURINE LT. YELLOW 03/05/2013 0930   APPEARANCEUR CLEAR 03/05/2013 0930   LABSPEC 1.020 03/05/2013 0930   PHURINE 6.0 03/05/2013 0930   GLUCOSEU 100 03/05/2013 0930   HGBUR NEGATIVE 03/05/2013 0930   BILIRUBINUR NEGATIVE 03/05/2013 0930   KETONESUR NEGATIVE 03/05/2013 0930   UROBILINOGEN 0.2 03/05/2013 0930   NITRITE  NEGATIVE 03/05/2013  0930   LEUKOCYTESUR NEGATIVE 03/05/2013 0930    STUDIES: No results found.  ASSESSMENT: 75 y.o. [place] woman  (1) status post right lumpectomy in 1987 for a stage I invasive breast cancer, treated with adjuvant radiation and tamoxifen for 5 years  (a) chronic right upper extremity lymphedema, grade 1  (2) status post left breast upper inner quadrant biopsy 09/01/2015 for ductal carcinoma in situ, low-grade, extending approximately 4 cm, estrogen and progesterone receptor positive  (3) status post left mastectomy and sentinel lymph node sampling 10/28/2015 for ductal carcinoma in situ, grade 1, measuring 3.5 cm, with ample margins, and all 3 sentinel lymph nodes clear  (4) genetics testing declined by patient  (5) adjuvant tamoxifen started 12/02/2015. Stopped in April 2017  (6) osteopenia, with bone density December 2015 showing a T score of -1.5  PLAN: Lillyana and I reviewed Dr. Virgie Dad notes about her potential for recurrent disease. Of course 5 years of tamoxifen would drop her risk from 10% to 5%, but she is not tolerating the drug well and she wishes to discontinue it permanently with no consideration for the aromatase inhibitor group. This is perfectly reasonable, as she understands that she was essentially "cured" of her non invasive disease with the mastectomy, and that any antiestrogen therapy from this point is technically prevention of a future cancer.  Irais will visit Dr. Barry Dienes in August, and have a repeat right mammogram in October. She will return in January for annual follow up with Dr. Jana Hakim. She understands and agrees with this plan. She has been encouraged to call with any issues that might arise before her next visit here.   Laurie Panda, NP   03/31/2016 1:56 PM

## 2016-03-31 NOTE — Telephone Encounter (Signed)
appt made and avs printed °

## 2016-04-08 ENCOUNTER — Other Ambulatory Visit: Payer: Self-pay | Admitting: Family

## 2016-04-18 ENCOUNTER — Encounter: Payer: Self-pay | Admitting: Internal Medicine

## 2016-04-19 ENCOUNTER — Telehealth: Payer: Self-pay | Admitting: *Deleted

## 2016-04-19 MED ORDER — PIOGLITAZONE HCL 30 MG PO TABS: 30.0000 mg | ORAL_TABLET | Freq: Every day | ORAL | Status: DC

## 2016-04-19 NOTE — Telephone Encounter (Signed)
Received fax from [facility] for actos 30mg  once a day, #90. Rx sent.

## 2016-05-05 ENCOUNTER — Encounter: Payer: Self-pay | Admitting: Family

## 2016-05-05 MED ORDER — FLUTICASONE PROPIONATE 50 MCG/ACT NA SUSP: NASAL | Status: DC

## 2016-06-01 LAB — HM DIABETES EYE EXAM

## 2016-06-08 ENCOUNTER — Ambulatory Visit (INDEPENDENT_AMBULATORY_CARE_PROVIDER_SITE_OTHER): Payer: Medicare Other | Admitting: Family

## 2016-06-08 ENCOUNTER — Encounter: Payer: Self-pay | Admitting: Family

## 2016-06-08 ENCOUNTER — Ambulatory Visit: Payer: Medicare Other | Admitting: Family

## 2016-06-08 DIAGNOSIS — Z0000 Encounter for general adult medical examination without abnormal findings: Secondary | ICD-10-CM | POA: Diagnosis not present

## 2016-06-08 NOTE — Progress Notes (Signed)
Pre visit review using our clinic review tool, if applicable. No additional management support is needed unless otherwise documented below in the visit note. 

## 2016-06-08 NOTE — Progress Notes (Signed)
Subjective:    Patient ID: Kelly Bell, female    DOB: [birth year], 75 y.o.   MRN: [id number]  HPI  Patient presents today for complete physical.  Immunizations: up to date Diet: working on low cholesterol diet [facility] [employer]  Colonoscopy: scheduled for this month Dexa: 12/15- osteopenia Pap Smear: hysterectomy Mammogram:09/01/15    Review of Systems  Constitutional: Negative for unexpected weight change.  HENT: Negative for hearing loss and rhinorrhea.   Eyes: Negative for visual disturbance.  Respiratory: Negative for cough and shortness of breath.   Cardiovascular: Negative for chest pain.  Gastrointestinal: Negative for constipation and diarrhea.  Genitourinary: Negative for dysuria, frequency and hematuria.  Musculoskeletal:       Some knee pain  Skin: Negative for rash.  Neurological: Negative for headaches.  Hematological: Negative for adenopathy.  Psychiatric/Behavioral:       Denies anxiety/depression   Past Medical History:  Diagnosis Date  . Breast cancer ([place])    Right  . HTN (hypertension)   . Hyperlipidemia   . Irritable bladder   . Knee pain    Bilateral  . Type II or unspecified type diabetes mellitus without mention of complication, not stated as uncontrolled      Social History   Social History  . Marital status: Married    Spouse name: N/A  . Number of children: 2  . Years of education: 12   Occupational History  . 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 Smoking status: Never Smoker  . Smokeless tobacco: Never Used  . Alcohol use No  . Drug use: No  . Sexual activity: Not Currently   Other Topics Concern  . Not on file   Social History Narrative   HSG. Married 1965. 1 son - '68, 1 daughter-'72; 3 grandsons. 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                Past Surgical History:  Procedure Laterality Date  . ABDOMINAL HYSTERECTOMY  1975   . COLONOSCOPY  05/10/05  . MASTECTOMY PARTIAL / LUMPECTOMY W/ AXILLARY LYMPHADENECTOMY    . MASTECTOMY W/ SENTINEL NODE BIOPSY Left 10/28/2015   Procedure: LEFT MASTECTOMY WITH SENTINEL LYMPH NODE BIOPSY;  Surgeon: Stark Klein, MD;  Location: [facility];  Service: General;  Laterality: Left;  . SHOULDER ARTHROSCOPY  2000    Family History  Problem Relation Age of Onset  . Diabetes Mother 39    Deceased  . Pancreatic cancer Mother     dx. early 62s  . Diabetes Father 36    Deceased  . Heart failure Father   . Heart disease Father     CHF  . Breast cancer Sister 15  . Cancer Brother     oral cancer - metastasis to lungs; smoker  . Corneal Dystrophy Son     bilateral  . Healthy Daughter     x1  . Breast cancer Sister     dx. 75s  . Cancer Cousin     oral cancer dx. <60; +EtOH, smoker; daughter of father's paternal half-brother    Allergies  Allergen Reactions  . Pravastatin Other (See Comments)    Severe muscle cramps.  . Lisinopril     cough    Current Outpatient Prescriptions on File Prior to Visit  Medication Sig Dispense Refill  . ACCU-CHEK AVIVA PLUS test strip USE TO CHECK  BLOOD SUGAR ONCE DAILY 100 each 1  . aspirin 325 MG tablet Take 325 mg by mouth daily.      . Calcium Carbonate-Vitamin D (CALTRATE 600+D) 600-400 MG-UNIT per tablet Take 1 tablet by mouth 2 (two) times daily.    Marland Kitchen ezetimibe (ZETIA) 10 MG tablet Take 1 tablet (10 mg total) by mouth daily. 30 tablet 3  . fluticasone (FLONASE) 50 MCG/ACT nasal spray USE TWO SPRAY IN EACH NOSTRIL ONCE DAILY 48 g 1  . hydrochlorothiazide (HYDRODIURIL) 25 MG tablet TAKE 1/2 TABLET BY MOUTH EVERY DAY 45 tablet 1  . losartan (COZAAR) 50 MG tablet Take 1 tablet (50 mg total) by mouth daily. 90 tablet 1  . metoprolol succinate (TOPROL-XL) 25 MG 24 hr tablet TAKE ONE TABLET BY MOUTH ONCE DAILY 90 tablet 0  . nabumetone (RELAFEN) 500 MG tablet TAKE ONE TABLET BY MOUTH TWICE DAILY as needed AFTER A MEAL FOR  ARTHRITIS 60 tablet 0  . pioglitazone (ACTOS) 30 MG tablet Take 1 tablet (30 mg total) by mouth daily. 90 tablet 0  . [DISCONTINUED] oxybutynin (DITROPAN) 5 MG tablet TAKE ONE TABLET BY MOUTH 4 TIMES DAILY AS NEEDED 120 tablet 5   No current facility-administered medications on file prior to visit.     There were no vitals taken for this visit.       Objective:   Physical Exam   Physical Exam  Constitutional: She is oriented to person, place, and time. She appears well-developed and well-nourished. No distress.  HENT:  Head: Normocephalic and atraumatic.  Right Ear: Tympanic membrane and ear canal normal.  Left Ear: Tympanic membrane and ear canal normal.  Mouth/Throat: Oropharynx is clear and moist.  Eyes: Pupils are equal, round, and reactive to light. No scleral icterus.  Neck: Normal range of motion. No thyromegaly present.  Cardiovascular: Normal rate and regular rhythm.   No murmur heard. Pulmonary/Chest: Effort normal and breath sounds normal. No respiratory distress. He has no wheezes. She has no rales. She exhibits no tenderness.  Abdominal: Soft. Bowel sounds are normal. She exhibits no distension and no mass. There is no tenderness. There is no rebound and no guarding.  Musculoskeletal: She exhibits no edema.  Lymphadenopathy:    She has no cervical adenopathy.  Neurological: She is alert and oriented to person, place, and time. She has normal patellar reflexes. She exhibits normal muscle tone. Coordination normal.  Skin: Skin is warm and dry.  Psychiatric: She has a normal mood and affect. Her behavior is normal. Judgment and thought content normal.  Breast/pelvic: deferred       Assessment & Plan:  Preventative Care- discussed healthy diet, exercise and weight loss.   Preventative care testing is up to date.         Assessment & Plan:

## 2016-06-08 NOTE — Patient Instructions (Signed)
Please continue to work on healthy diet, exercise and weight loss.  ° °

## 2016-06-08 NOTE — Progress Notes (Signed)
Subjective:    Kelly Bell is a 75 y.o. female who presents for Medicare Annual/Subsequent preventive examination.  Preventive Screening-Counseling & Management  Tobacco History  Smoking Status  . Never Smoker  Smokeless Tobacco  . Never Used     Problems Prior to Visit 1. HTN- maintained on hctz, toprol xl, losartan.  BP Readings from Last 3 Encounters:  06/08/16 (!) 142/74  03/31/16 130/84  03/09/16 138/78   2.  DM2- on actos. Reports sugars are well controlled. Generally <150.   Lab Results  Component Value Date   HGBA1C 6.3 03/09/2016   HGBA1C 5.7 08/07/2015   HGBA1C 5.6 04/06/2015   Lab Results  Component Value Date   MICROALBUR 0.7 08/07/2015   LDLCALC 166 (H) 03/09/2016   CREATININE 0.81 03/09/2016   3.  Hx of breast cancer- (ductal carcinoma insitu), she is s/p mastectomy.    4.  Overactive bladder- on ditropan. Reports that symptoms are well controlled on ditropan.    5.  Hyperlipidemia- reports some myalgia on zetia but "tolerates."  Trying to work harder on diet.    5. Patient presents today for complete physical.  Immunizations: up to date Diet: working on low cholesterol diet [facility] [employer]  Colonoscopy: scheduled for this month Dexa: 12/15- osteopenia Pap Smear: hysterectomy Mammogram:09/01/15    Current Problems (verified) Patient Active Problem List   Diagnosis Date Noted  . Overactive bladder 03/09/2016  . Carcinoma of left breast upper inner quadrant ([place]) 10/28/2015  . Family history of breast cancer 09/22/2015  . Breast cancer of upper-inner quadrant of left female breast ([place]) 09/09/2015  . 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 Bradycardia 09/17/2014  . Edema 03/05/2013  . Obesity, Class II, BMI 35-39.9 06/23/2012  . Routine health maintenance 06/23/2012  . LOW BACK PAIN, ACUTE 04/22/2010  . ALLERGIC RHINITIS 08/12/2009  . Other specified disorders of bladder 06/03/2008  . KNEE PAIN, LEFT 06/03/2008  . 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 CHEST PAIN, ATYPICAL, HX OF 10/02/2007  . Diabetes type 2, controlled ([place]) 10/01/2007  . Hyperlipidemia 09/13/2007  . Essential hypertension 09/13/2007  . DEGENERATIVE JOINT DISEASE 09/13/2007  . BREAST CANCER, HX OF 09/13/2007    Medications Prior to Visit Current Outpatient Prescriptions on File Prior to Visit  Medication Sig Dispense Refill  . ACCU-CHEK AVIVA PLUS test strip USE TO CHECK BLOOD SUGAR ONCE DAILY 100 each 1  . aspirin 325 MG tablet Take 325 mg by mouth daily.      . Calcium Carbonate-Vitamin D (CALTRATE 600+D) 600-400 MG-UNIT per tablet Take 1 tablet by mouth 2 (two) times daily.    Marland Kitchen ezetimibe (ZETIA) 10 MG tablet Take 1 tablet (10 mg total) by mouth daily. 30 tablet 3  . fluticasone (FLONASE) 50 MCG/ACT nasal spray USE TWO SPRAY IN EACH NOSTRIL ONCE DAILY 48 g 1  . hydrochlorothiazide (HYDRODIURIL) 25 MG tablet TAKE 1/2 TABLET BY MOUTH EVERY DAY 45 tablet 1  . losartan (COZAAR) 50 MG tablet Take 1 tablet (50 mg total) by mouth daily. 90 tablet 1  . metoprolol succinate (TOPROL-XL) 25 MG 24 hr tablet TAKE ONE TABLET BY MOUTH ONCE DAILY 90 tablet 0  . nabumetone (RELAFEN) 500 MG tablet TAKE ONE TABLET BY MOUTH TWICE DAILY as needed AFTER A MEAL FOR ARTHRITIS 60 tablet 0  . pioglitazone (ACTOS) 30 MG tablet Take 1 tablet (30 mg total) by mouth daily. 90 tablet 0  . [DISCONTINUED] oxybutynin (DITROPAN) 5 MG tablet TAKE ONE TABLET BY MOUTH 4  TIMES DAILY AS NEEDED 120 tablet 5   No current facility-administered medications on file prior to visit.     Current Medications (verified) Current Outpatient Prescriptions  Medication Sig Dispense Refill  . ACCU-CHEK AVIVA PLUS test strip USE TO CHECK BLOOD SUGAR ONCE DAILY 100 each 1  . aspirin 325 MG tablet Take 325 mg by mouth daily.      . Calcium Carbonate-Vitamin D (CALTRATE 600+D) 600-400 MG-UNIT per tablet Take 1 tablet by mouth 2 (two) times daily.    Marland Kitchen ezetimibe (ZETIA) 10 MG tablet Take 1 tablet (10 mg total) by  mouth daily. 30 tablet 3  . fluticasone (FLONASE) 50 MCG/ACT nasal spray USE TWO SPRAY IN EACH NOSTRIL ONCE DAILY 48 g 1  . hydrochlorothiazide (HYDRODIURIL) 25 MG tablet TAKE 1/2 TABLET BY MOUTH EVERY DAY 45 tablet 1  . losartan (COZAAR) 50 MG tablet Take 1 tablet (50 mg total) by mouth daily. 90 tablet 1  . metoprolol succinate (TOPROL-XL) 25 MG 24 hr tablet TAKE ONE TABLET BY MOUTH ONCE DAILY 90 tablet 0  . nabumetone (RELAFEN) 500 MG tablet TAKE ONE TABLET BY MOUTH TWICE DAILY as needed AFTER A MEAL FOR ARTHRITIS 60 tablet 0  . oxybutynin (DITROPAN) 5 MG tablet Take 5 mg by mouth daily.    . pioglitazone (ACTOS) 30 MG tablet Take 1 tablet (30 mg total) by mouth daily. 90 tablet 0   No current facility-administered medications for this visit.      Allergies (verified) Pravastatin and Lisinopril   PAST HISTORY  Family History Family History  Problem Relation Age of Onset  . Diabetes Mother 79    Deceased  . Pancreatic cancer Mother     dx. early 22s  . Diabetes Father 36    Deceased  . Heart failure Father   . Heart disease Father     CHF  . Breast cancer Sister 36  . Cancer Brother     oral cancer - metastasis to lungs; smoker  . Corneal Dystrophy Son     bilateral  . Healthy Daughter     x1  . Breast cancer Sister     dx. 29s  . Cancer Cousin     oral cancer dx. <60; +EtOH, smoker; daughter of father's paternal half-brother    Social History Social History  Substance Use Topics  . Smoking status: Never Smoker  . Smokeless tobacco: Never Used  . Alcohol use No     Are there smokers in your home (other than you)? No  Risk Factors Current exercise habits: goes to [employer]  Dietary issues discussed: low cholesterol    Cardiac risk factors: advanced age (older than 88 for men, 71 for women), diabetes mellitus, dyslipidemia and hypertension.  Depression Screen (Note: if answer to either of the following is "Yes", a more complete depression screening is indicated)    Over the past two weeks, have you felt down, depressed or hopeless? No  Over the past two weeks, have you felt little interest or pleasure in doing things? No  Have you lost interest or pleasure in daily life? No  Do you often feel hopeless? No  Do you cry easily over simple problems? No  Activities of Daily Living In your present state of health, do you have any difficulty performing the following activities?:  Driving? No Managing money?  No Feeding yourself? No Getting from bed to chair? No . Climbing a flight of stairs? No Preparing food and eating?: No Bathing or showering?  No Getting dressed: No Getting to the toilet? No Using the toilet:No Moving around from place to place: No In the past year have you fallen or had a near fall?:No   Are you sexually active?  No  Do you have more than one partner?  No  Hearing Difficulties: No Do you often ask people to speak up or repeat themselves? No Do you experience ringing or noises in your ears? No Do you have difficulty understanding soft or whispered voices? No   Do you feel that you have a problem with memory? No  Do you often misplace items? No  Do you feel safe at home?  Yes  Cognitive Testing  Alert? Yes  Normal Appearance?Yes  Oriented to person? Yes  Place? Yes   Time? Yes  Recall of three objects?  Yes  Can perform simple calculations? Yes  Displays appropriate judgment?Yes  Can read the correct time from a watch face?Yes   Advanced Directives have been discussed with the patient? Yes  List the Names of Other Physician/Practitioners you currently use: 1.  See care team  Indicate any recent Medical Services you may have received from other than [facility] providers in the past year (date may be approximate).  Immunization History  Administered Date(s) Administered  . Influenza Split 08/03/2011, 09/13/2012  . Influenza Whole 08/12/2008, 08/12/2009, 08/19/2010  . Influenza, High Dose Seasonal PF 09/05/2013  .  Influenza,inj,Quad PF,36+ Mos 08/18/2014, 08/07/2015  . Pneumococcal Conjugate-13 08/18/2014  . Pneumococcal Polysaccharide-23 09/10/2007  . Td 10/21/2008  . Zoster 02/04/2010    Screening Tests Health Maintenance  Topic Date Due  . FOOT EXAM  06/22/2013  . OPHTHALMOLOGY EXAM  05/14/2016  . INFLUENZA VACCINE  06/14/2016  . MAMMOGRAM  08/31/2016  . HEMOGLOBIN A1C  09/08/2016  . TETANUS/TDAP  10/21/2018  . DEXA SCAN  Completed  . ZOSTAVAX  Completed  . PNA vac Low Risk Adult  Completed    All answers were reviewed with the patient and necessary referrals were made:  O'SULLIVAN,Carlynn Leduc S., NP   06/08/2016   History reviewed: allergies, current medications, past family history, past medical history, past social history, past surgical history and problem list  Review of Systems see cpx    Objective:      There is no height or weight on file to calculate BMI. There were no vitals taken for this visit.  See CPX     Assessment:          Plan:     During the course of the visit the patient was educated and counseled about appropriate screening and preventive services including:    Bone densitometry screening  Nutrition counseling   Advanced directives: has an advanced directive - a copy HAS NOT been provided.  Diet review for nutrition referral? Yes ____  Not Indicated x____   Patient Instructions (the written plan) was given to the patient.  Medicare Attestation I have personally reviewed: The patient's medical and social history Their use of alcohol, tobacco or illicit drugs Their current medications and supplements The patient's functional ability including ADLs,fall risks, home safety risks, cognitive, and hearing and visual impairment Diet and physical activities Evidence for depression or mood disorders  The patient's weight, height, BMI, and visual acuity have been recorded in the chart.  I have made referrals, counseling, and provided education  to the patient based on review of the above and I have provided the patient with a written personalized care plan for preventive services.  O'SULLIVAN,Ziara Thelander S., NP   06/08/2016

## 2016-06-14 ENCOUNTER — Ambulatory Visit (AMBULATORY_SURGERY_CENTER): Payer: Medicare Other | Admitting: *Deleted

## 2016-06-14 VITALS — Ht 64.0 in | Wt 244.2 lb

## 2016-06-14 DIAGNOSIS — Z86010 Personal history of colonic polyps: Secondary | ICD-10-CM

## 2016-06-14 MED ORDER — SUPREP BOWEL PREP KIT 17.5-3.13-1.6 GM/177ML PO SOLN
1.0000 | Freq: Once | ORAL | 0 refills | Status: AC
Start: 2016-06-14 — End: 2016-06-14

## 2016-06-14 NOTE — Progress Notes (Signed)
Patient denies any allergies to egg or soy products. Patient denies complications with anesthesia/sedation.  Patient denies oxygen use at home and denies diet medications. Emmi instructions for colonoscopy explained but patient denied.     

## 2016-06-28 ENCOUNTER — Encounter: Payer: Self-pay | Admitting: Internal Medicine

## 2016-06-28 ENCOUNTER — Ambulatory Visit (AMBULATORY_SURGERY_CENTER): Payer: Medicare Other | Admitting: Internal Medicine

## 2016-06-28 VITALS — BP 151/73 | HR 53 | Temp 97.8°F | Resp 12 | Ht 64.0 in | Wt 244.0 lb

## 2016-06-28 DIAGNOSIS — Z86010 Personal history of colonic polyps: Secondary | ICD-10-CM | POA: Diagnosis present

## 2016-06-28 LAB — GLUCOSE, CAPILLARY
GLUCOSE-CAPILLARY: 93 mg/dL (ref 65–99)
Glucose-Capillary: 107 mg/dL — ABNORMAL HIGH (ref 65–99)

## 2016-06-28 MED ORDER — SODIUM CHLORIDE 0.9 % IV SOLN: 500.0000 mL | INTRAVENOUS | Status: DC

## 2016-06-28 NOTE — Op Note (Signed)
[place] Patient Name: Kelly Bell Procedure Date: 06/28/2016 10:24 AM MRN: [id number] Endoscopist: Docia Chuck. Henrene Pastor , MD Age: 75 Referring MD:  Date of Birth: [birth year] Gender: Female Account #: [id number] Procedure:                Colonoscopy Indications:              High risk colon cancer surveillance: Personal                            history of non-advanced adenoma. Index 2006 with                            tubular adenomas, follow-up 2011 with diminutive                            tubular adenomatous Medicines:                Monitored Anesthesia Care Procedure:                Pre-Anesthesia Assessment:                           - Prior to the procedure, a History and Physical                            was performed, and patient medications and                            allergies were reviewed. The patient's tolerance of                            previous anesthesia was also reviewed. The risks                            and benefits of the procedure and the sedation                            options and risks were discussed with the patient.                            All questions were answered, and informed consent                            was obtained. Prior Anticoagulants: The patient has                            taken no previous anticoagulant or antiplatelet                            agents. ASA Grade Assessment: II - A patient with                            mild systemic disease. After reviewing the risks  and benefits, the patient was deemed in                            satisfactory condition to undergo the procedure.                           After obtaining informed consent, the colonoscope                            was passed under direct vision. Throughout the                            procedure, the patient's blood pressure, pulse, and                            oxygen saturations were monitored continuously. The                             Model CF-HQ190L [id number]) scope was introduced                            through the anus and advanced to the the cecum,                            identified by appendiceal orifice and ileocecal                            valve. The ileocecal valve, appendiceal orifice,                            and rectum were photographed. The quality of the                            bowel preparation was excellent. The colonoscopy                            was performed without difficulty. The patient                            tolerated the procedure well. The bowel preparation                            used was SUPREP. Scope In: 10:32:54 AM Scope Out: [id number] AM Scope Withdrawal Time: 0 hours 8 minutes 40 seconds  Total Procedure Duration: 0 hours 12 minutes 18 seconds  Findings:                 Multiple small and large-mouthed diverticula were                            found in the entire colon.                           Internal hemorrhoids were found during retroflexion.  The exam was otherwise without abnormality on                            direct and retroflexion views. Complications:            No immediate complications. Estimated blood loss:                            None. Estimated Blood Loss:     Estimated blood loss: none. Impression:               - Diverticulosis in the entire examined colon.                           - Internal hemorrhoids.                           - The examination was otherwise normal on direct                            and retroflexion views.                           - No specimens collected. Recommendation:           - Repeat colonoscopy is not recommended for                            surveillance, due to age and favorable findings.                           - Patient has a contact number available for                            emergencies. The signs and symptoms of potential                             delayed complications were discussed with the                            patient. Return to normal activities tomorrow.                            Written discharge instructions were provided to the                            patient.                           - Resume previous diet.                           - Continue present medications. Docia Chuck. Henrene Pastor, MD 06/28/2016 10:49:45 AM This report has been signed electronically.

## 2016-06-28 NOTE — Progress Notes (Signed)
Patient awakening,vss,report to rn 

## 2016-06-28 NOTE — Patient Instructions (Signed)
YOU HAD AN ENDOSCOPIC PROCEDURE TODAY AT [facility]:   Refer to the procedure report that was given to you for any specific questions about what was found during the examination.  If the procedure report does not answer your questions, please call your gastroenterologist to clarify.  If you requested that your care partner not be given the details of your procedure findings, then the procedure report has been included in a sealed envelope for you to review at your convenience later.  YOU SHOULD EXPECT: Some feelings of bloating in the abdomen. Passage of more gas than usual.  Walking can help get rid of the air that was put into your GI tract during the procedure and reduce the bloating. If you had a lower endoscopy (such as a colonoscopy or flexible sigmoidoscopy) you may notice spotting of blood in your stool or on the toilet paper. If you underwent a bowel prep for your procedure, you may not have a normal bowel movement for a few days.  Please Note:  You might notice some irritation and congestion in your nose or some drainage.  This is from the oxygen used during your procedure.  There is no need for concern and it should clear up in a day or so.  SYMPTOMS TO REPORT IMMEDIATELY:   Following lower endoscopy (colonoscopy or flexible sigmoidoscopy):  Excessive amounts of blood in the stool  Significant tenderness or worsening of abdominal pains  Swelling of the abdomen that is new, acute  Fever of 100F or higher   Following upper endoscopy (EGD)  Vomiting of blood or coffee ground material  New chest pain or pain under the shoulder blades  Painful or persistently difficult swallowing  New shortness of breath  Fever of 100F or higher  Black, tarry-looking stools  For urgent or emergent issues, a gastroenterologist can be reached at any hour by calling [phone].   DIET:  We do recommend a small meal at first, but then you may proceed to your regular diet.  Drink  plenty of fluids but you should avoid alcoholic beverages for 24 hours.  ACTIVITY:  You should plan to take it easy for the rest of today and you should NOT DRIVE or use heavy machinery until tomorrow (because of the sedation medicines used during the test).    FOLLOW UP: Our staff will call the number listed on your records the next business day following your procedure to check on you and address any questions or concerns that you may have regarding the information given to you following your procedure. If we do not reach you, we will leave a message.  However, if you are feeling well and you are not experiencing any problems, there is no need to return our call.  We will assume that you have returned to your regular daily activities without incident.  If any biopsies were taken you will be contacted by phone or by letter within the next 1-3 weeks.  Please call us at [phone] if you have not heard about the biopsies in 3 weeks.    SIGNATURES/CONFIDENTIALITY: You and/or your care partner have signed paperwork which will be entered into your electronic medical record.  These signatures attest to the fact that that the information above on your After Visit Summary has been reviewed and is understood.  Full responsibility of the confidentiality of this discharge information lies with you and/or your care-partner.  Diverticulosis, high fiber diet, and hemorrhoid information given. No follow-up due  to age.

## 2016-06-29 ENCOUNTER — Telehealth: Payer: Self-pay

## 2016-06-29 NOTE — Telephone Encounter (Signed)
  Follow up Call-  Call back number 06/28/2016  Post procedure Call Back phone  # [phone]  Permission to leave phone message Yes  Some recent data might be hidden    Patient was called for follow up after her procedure on 06/28/2016. No answer at the number given for follow up phone call. A message was left on the answering machine.

## 2016-07-11 ENCOUNTER — Other Ambulatory Visit: Payer: Self-pay | Admitting: Family

## 2016-07-18 ENCOUNTER — Other Ambulatory Visit: Payer: Self-pay | Admitting: Family

## 2016-08-12 ENCOUNTER — Other Ambulatory Visit: Payer: Self-pay | Admitting: Family

## 2016-09-07 ENCOUNTER — Encounter: Payer: Self-pay | Admitting: Family

## 2016-09-07 ENCOUNTER — Ambulatory Visit (INDEPENDENT_AMBULATORY_CARE_PROVIDER_SITE_OTHER): Payer: Medicare Other | Admitting: Family

## 2016-09-07 VITALS — BP 168/64 | HR 50 | Temp 98.2°F | Resp 16 | Ht 64.0 in | Wt 245.8 lb

## 2016-09-07 DIAGNOSIS — E119 Type 2 diabetes mellitus without complications: Secondary | ICD-10-CM

## 2016-09-07 DIAGNOSIS — Z23 Encounter for immunization: Secondary | ICD-10-CM

## 2016-09-07 DIAGNOSIS — E785 Hyperlipidemia, unspecified: Secondary | ICD-10-CM

## 2016-09-07 DIAGNOSIS — I10 Essential (primary) hypertension: Secondary | ICD-10-CM

## 2016-09-07 LAB — BASIC METABOLIC PANEL
BUN: 13 mg/dL (ref 6–23)
CALCIUM: 10.2 mg/dL (ref 8.4–10.5)
CO2: 29 mEq/L (ref 19–32)
CREATININE: 0.87 mg/dL (ref 0.40–1.20)
Chloride: 105 mEq/L (ref 96–112)
GFR: 81.28 mL/min (ref >60.00)
GLUCOSE: 91 mg/dL (ref 70–99)
Potassium: 4 mEq/L (ref 3.5–5.1)
SODIUM: 140 meq/L (ref 135–145)

## 2016-09-07 LAB — LIPID PANEL
CHOL/HDL RATIO: 5
Cholesterol: 204 mg/dL — ABNORMAL HIGH (ref 0–200)
HDL: 40.6 mg/dL (ref >39.00)
LDL Cholesterol: 133 mg/dL — ABNORMAL HIGH (ref 0–99)
NONHDL: 163.18
Triglycerides: 152 mg/dL — ABNORMAL HIGH (ref 0.0–149.0)
VLDL: 30.4 mg/dL (ref 0.0–40.0)

## 2016-09-07 LAB — HEMOGLOBIN A1C: Hgb A1c MFr Bld: 5.7 % (ref 4.6–6.5)

## 2016-09-07 MED ORDER — PIOGLITAZONE HCL 30 MG PO TABS: 30.0000 mg | ORAL_TABLET | Freq: Every day | ORAL | 0 refills | Status: DC

## 2016-09-07 MED ORDER — LOSARTAN POTASSIUM 100 MG PO TABS: 100.0000 mg | ORAL_TABLET | Freq: Every day | ORAL | 1 refills | Status: DC

## 2016-09-07 MED ORDER — HYDROCHLOROTHIAZIDE 25 MG PO TABS: ORAL_TABLET | ORAL | 1 refills | Status: DC

## 2016-09-07 NOTE — Progress Notes (Signed)
Pre visit review using our clinic review tool, if applicable. No additional management support is needed unless otherwise documented below in the visit note. 

## 2016-09-07 NOTE — Patient Instructions (Signed)
Please complete lab work prior to leaving. Increase losartan from 50mg  once daily to 100mg  once daily.

## 2016-09-07 NOTE — Progress Notes (Signed)
Subjective:    Patient ID: Kelly Bell, female    DOB: 16-Sep-1941, 75 y.o.   MRN: [id number]  HPI  Kelly Bell is a 75 yr old female who presents today for follow up.  1) HTN- maintained on losartan, metoprolol and hctz.  BP Readings from Last 3 Encounters:  09/07/16 (!) 158/67  06/28/16 (!) 151/73  06/08/16 (!) 142/74   2) DM2- maintained on actos.  Lab Results  Component Value Date   HGBA1C 6.3 03/09/2016   HGBA1C 5.7 08/07/2015   HGBA1C 5.6 04/06/2015   Lab Results  Component Value Date   MICROALBUR 0.7 08/07/2015   LDLCALC 166 (H) 03/09/2016   CREATININE 0.81 03/09/2016   3) Hyperlipidemia- maintained on zetia (only able to tolerate 3 times a week) Intolerant to statins.  Lab Results  Component Value Date   CHOL 239 (H) 03/09/2016   HDL 39.80 03/09/2016   LDLCALC 166 (H) 03/09/2016   LDLDIRECT 111.5 01/01/2007   TRIG 166.0 (H) 03/09/2016   CHOLHDL 6 03/09/2016       Review of Systems    see HPI  Past Medical History:  Diagnosis Date  . Allergy   . Arthritis    knees  . Breast cancer ([place])    Right  . HTN (hypertension)   . Hyperlipidemia   . Hypertension   . Irritable bladder   . Knee pain    Bilateral  . SVD (spontaneous vaginal delivery)    x 2  . Type II or unspecified type diabetes mellitus without mention of complication, not stated as uncontrolled      Social History   Social History  . Marital status: Married    Spouse name: N/A  . Number of children: 2  . Years of education: 12   Occupational History  . 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 Smoking status: Never Smoker  . Smokeless tobacco: Never Used  . Alcohol use Yes     Comment: occasional wine  . Drug use: No  . Sexual activity: Not Currently    Birth control/ protection: Post-menopausal     Comment: Hysterectomy   Other Topics Concern  . Not on file   Social History Narrative   HSG. Married 1965. 1 son -  '68, 1 daughter-'72; 3 grandsons. 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                Past Surgical History:  Procedure Laterality Date  . ABDOMINAL HYSTERECTOMY  1975  . COLONOSCOPY  05/10/05, 2011   hx polyps  . MASTECTOMY PARTIAL / LUMPECTOMY W/ AXILLARY LYMPHADENECTOMY  07/1986  . MASTECTOMY W/ SENTINEL NODE BIOPSY Left 10/28/2015   Procedure: LEFT MASTECTOMY WITH SENTINEL LYMPH NODE BIOPSY;  Surgeon: Stark Klein, MD;  Location: [facility];  Service: General;  Laterality: Left;  . SHOULDER ARTHROSCOPY  2000    Family History  Problem Relation Age of Onset  . Diabetes Mother 16    Deceased  . Pancreatic cancer Mother     dx. early 52s  . Diabetes Father 76    Deceased  . Heart failure Father   . Heart disease Father     CHF  . Breast cancer Sister 26  . Cancer Brother     oral cancer - metastasis to lungs; smoker  . Corneal Dystrophy Son     bilateral  . Healthy Daughter  x1  . Breast cancer Sister     dx. 4s  . Esophageal cancer Maternal Grandfather   . Rectal cancer Maternal Grandfather   . Stomach cancer Maternal Grandfather   . Cancer Cousin     oral cancer dx. <60; +EtOH, smoker; daughter of father's paternal half-brother    Allergies  Allergen Reactions  . Pravastatin Other (See Comments)    Severe muscle cramps.  . Lisinopril     cough    Current Outpatient Prescriptions on File Prior to Visit  Medication Sig Dispense Refill  . ACCU-CHEK AVIVA PLUS test strip USE TO CHECK BLOOD SUGAR ONCE DAILY 100 each 1  . aspirin 325 MG tablet Take 325 mg by mouth daily.      . Calcium Carbonate-Vitamin D (CALTRATE 600+D) 600-400 MG-UNIT per tablet Take 1 tablet by mouth 2 (two) times daily.    Marland Kitchen ezetimibe (ZETIA) 10 MG tablet Take 1 tablet (10 mg total) by mouth daily. 30 tablet 3  . fluticasone (FLONASE) 50 MCG/ACT nasal spray USE TWO SPRAY IN EACH NOSTRIL ONCE DAILY 48 g 1  . hydrochlorothiazide (HYDRODIURIL) 25 MG  tablet TAKE 1/2 TABLET BY MOUTH EVERY DAY 45 tablet 1  . losartan (COZAAR) 50 MG tablet Take 1 tablet (50 mg total) by mouth daily. 90 tablet 1  . metoprolol succinate (TOPROL-XL) 25 MG 24 hr tablet TAKE ONE TABLET BY MOUTH ONCE DAILY 90 tablet 0  . nabumetone (RELAFEN) 500 MG tablet TAKE ONE TABLET BY MOUTH TWICE DAILY AS NEEDED FOR  ARTHRITIS  TAKE  AFTER  A  MEAL* 60 tablet 0  . oxybutynin (DITROPAN) 5 MG tablet Take 5 mg by mouth daily.    . pioglitazone (ACTOS) 30 MG tablet Take 1 tablet (30 mg total) by mouth daily. 90 tablet 0  . [DISCONTINUED] oxybutynin (DITROPAN) 5 MG tablet TAKE ONE TABLET BY MOUTH 4 TIMES DAILY AS NEEDED 120 tablet 5   Current Facility-Administered Medications on File Prior to Visit  Medication Dose Route Frequency Provider Last Rate Last Dose  . 0.9 %  sodium chloride infusion  500 mL Intravenous Continuous Irene Shipper, MD        BP (!) 158/67 (BP Location: Left Arm, Patient Position: Sitting, Cuff Size: Large)   Pulse (!) 50   Temp 98.2 F (36.8 C) (Oral)   Resp 16   Ht 5\' 4"  (1.626 m)   Wt 245 lb 12.8 oz (111.5 kg)   SpO2 98% Comment: room air  BMI 42.19 kg/m    Objective:   Physical Exam  Constitutional: She appears well-developed and well-nourished.  Cardiovascular: Normal rate, regular rhythm and normal heart sounds.   No murmur heard. Pulmonary/Chest: Effort normal and breath sounds normal. No respiratory distress. She has no wheezes.  Psychiatric: She has a normal mood and affect. Her behavior is normal. Judgment and thought content normal.          Assessment & Plan:

## 2016-09-09 NOTE — Assessment & Plan Note (Signed)
Uncontrolled. Increase losartan.

## 2016-09-09 NOTE — Assessment & Plan Note (Signed)
Uncontrolled, discuss dietary changes, intolerant to statins, continue zetia as able.

## 2016-09-10 ENCOUNTER — Encounter: Payer: Self-pay | Admitting: Family

## 2016-09-19 ENCOUNTER — Telehealth: Payer: Self-pay | Admitting: Family

## 2016-09-19 MED ORDER — ACCU-CHEK AVIVA DEVI: 0 refills | Status: DC

## 2016-09-19 NOTE — Telephone Encounter (Signed)
Patient needs a new glucometer. Please advise.    Pharmacy: [facility], [place]

## 2016-09-19 NOTE — Telephone Encounter (Signed)
Spoke with pt. Has been using Accuchek aviva. Meter stopped working. She replaced battery but meter still will not work. Requests rx for same meter as she still has some test strips left. Rx sent in error to [facility] on [address]. Called and cancelled Rx and re-sent to [facility] on [address].

## 2016-09-21 MED ORDER — ACCU-CHEK AVIVA PLUS W/DEVICE KIT: PACK | 0 refills | Status: DC

## 2016-09-21 NOTE — Telephone Encounter (Signed)
Received fax back from [facility] wanting to know if glucometer Rx was for Aviva Plus. Faxed back yes, to dispense Accuchek Aviva Plus meter.

## 2016-09-21 NOTE — Addendum Note (Signed)
Addended by: Kelle Darting A on: 09/21/2016 10:52 AM   Modules accepted: Orders

## 2016-10-03 ENCOUNTER — Ambulatory Visit (INDEPENDENT_AMBULATORY_CARE_PROVIDER_SITE_OTHER): Payer: Medicare Other | Admitting: Family

## 2016-10-03 ENCOUNTER — Encounter: Payer: Self-pay | Admitting: Family

## 2016-10-03 VITALS — BP 146/69 | HR 62 | Temp 98.3°F | Resp 18 | Ht 64.0 in | Wt 248.8 lb

## 2016-10-03 DIAGNOSIS — E785 Hyperlipidemia, unspecified: Secondary | ICD-10-CM | POA: Diagnosis not present

## 2016-10-03 DIAGNOSIS — I159 Secondary hypertension, unspecified: Secondary | ICD-10-CM

## 2016-10-03 LAB — BASIC METABOLIC PANEL WITH GFR
BUN: 12 mg/dL (ref 6–23)
CO2: 27 meq/L (ref 19–32)
Calcium: 9.6 mg/dL (ref 8.4–10.5)
Chloride: 108 meq/L (ref 96–112)
Creatinine, Ser: 0.77 mg/dL (ref 0.40–1.20)
GFR: 93.56 mL/min
Glucose, Bld: 105 mg/dL — ABNORMAL HIGH (ref 70–99)
Potassium: 4.1 meq/L (ref 3.5–5.1)
Sodium: 140 meq/L (ref 135–145)

## 2016-10-03 NOTE — Progress Notes (Signed)
Subjective:    Patient ID: Kelly Bell, female    DOB: 08-08-1941, 75 y.o.   MRN: [id number]  HPI  Kelly Bell is a 75 yr old female who presents today for follow up:  1) HTN-  Currently maintained on toprol xl, hctz and losartan (was increased last visit).    BP Readings from Last 3 Encounters:  10/03/16 (!) 146/69  09/07/16 (!) 168/64  06/28/16 (!) 151/73   Denies CP/SOB or swelling.  Hyperlipidemia- on zetia, intolerant to statins.   Lab Results  Component Value Date   CHOL 204 (H) 09/07/2016   HDL 40.60 09/07/2016   LDLCALC 133 (H) 09/07/2016   LDLDIRECT 111.5 01/01/2007   TRIG 152.0 (H) 09/07/2016   CHOLHDL 5 09/07/2016     Review of Systems See HPI  Past Medical History:  Diagnosis Date  . Allergy   . Arthritis    knees  . Breast cancer ([place])    Right  . HTN (hypertension)   . Hyperlipidemia   . Hypertension   . Irritable bladder   . Knee pain    Bilateral  . SVD (spontaneous vaginal delivery)    x 2  . Type II or unspecified type diabetes mellitus without mention of complication, not stated as uncontrolled      Social History   Social History  . Marital status: Married    Spouse name: N/A  . Number of children: 2  . Years of education: 12   Occupational History  . 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 Smoking status: Never Smoker  . Smokeless tobacco: Never Used  . Alcohol use Yes     Comment: occasional wine  . Drug use: No  . Sexual activity: Not Currently    Birth control/ protection: Post-menopausal     Comment: Hysterectomy   Other Topics Concern  . Not on file   Social History Narrative   HSG. Married 1965. 1 son - '68, 1 daughter-'72; 3 grandsons. 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                Past Surgical History:  Procedure Laterality Date  . ABDOMINAL HYSTERECTOMY  1975  . COLONOSCOPY  05/10/05, 2011   hx polyps  . MASTECTOMY  PARTIAL / LUMPECTOMY W/ AXILLARY LYMPHADENECTOMY  07/1986  . MASTECTOMY W/ SENTINEL NODE BIOPSY Left 10/28/2015   Procedure: LEFT MASTECTOMY WITH SENTINEL LYMPH NODE BIOPSY;  Surgeon: Stark Klein, MD;  Location: [facility];  Service: General;  Laterality: Left;  . SHOULDER ARTHROSCOPY  2000    Family History  Problem Relation Age of Onset  . Diabetes Mother 60    Deceased  . Pancreatic cancer Mother     dx. early 43s  . Diabetes Father 13    Deceased  . Heart failure Father   . Heart disease Father     CHF  . Breast cancer Sister 74  . Cancer Brother     oral cancer - metastasis to lungs; smoker  . Corneal Dystrophy Son     bilateral  . Healthy Daughter     x1  . Breast cancer Sister     dx. 85s  . Esophageal cancer Maternal Grandfather   . Rectal cancer Maternal Grandfather   . Stomach cancer Maternal Grandfather   . Cancer Cousin     oral cancer dx. <60; +EtOH, smoker; daughter of  father's paternal half-brother    Allergies  Allergen Reactions  . Pravastatin Other (See Comments)    Severe muscle cramps.  . Lisinopril     cough    Current Outpatient Prescriptions on File Prior to Visit  Medication Sig Dispense Refill  . ACCU-CHEK AVIVA PLUS test strip USE TO CHECK BLOOD SUGAR ONCE DAILY 100 each 1  . aspirin 325 MG tablet Take 325 mg by mouth daily.      . Blood Glucose Monitoring Suppl (ACCU-CHEK AVIVA PLUS) w/Device KIT Use as instructed to check blood sugar once a day. DX  E11.9 1 kit 0  . Calcium Carbonate-Vitamin D (CALTRATE 600+D) 600-400 MG-UNIT per tablet Take 1 tablet by mouth 2 (two) times daily.    Marland Kitchen ezetimibe (ZETIA) 10 MG tablet Take 1 tablet (10 mg total) by mouth daily. 30 tablet 3  . fluticasone (FLONASE) 50 MCG/ACT nasal spray USE TWO SPRAY IN EACH NOSTRIL ONCE DAILY 48 g 1  . hydrochlorothiazide (HYDRODIURIL) 25 MG tablet TAKE 1/2 TABLET BY MOUTH EVERY DAY 45 tablet 1  . losartan (COZAAR) 100 MG tablet Take 1 tablet (100 mg total)  by mouth daily. 90 tablet 1  . metoprolol succinate (TOPROL-XL) 25 MG 24 hr tablet TAKE ONE TABLET BY MOUTH ONCE DAILY 90 tablet 0  . nabumetone (RELAFEN) 500 MG tablet TAKE ONE TABLET BY MOUTH TWICE DAILY AS NEEDED FOR  ARTHRITIS  TAKE  AFTER  A  MEAL* 60 tablet 0  . oxybutynin (DITROPAN) 5 MG tablet Take 5 mg by mouth daily.    . pioglitazone (ACTOS) 30 MG tablet Take 1 tablet (30 mg total) by mouth daily. 90 tablet 0  . [DISCONTINUED] oxybutynin (DITROPAN) 5 MG tablet TAKE ONE TABLET BY MOUTH 4 TIMES DAILY AS NEEDED 120 tablet 5   Current Facility-Administered Medications on File Prior to Visit  Medication Dose Route Frequency Provider Last Rate Last Dose  . 0.9 %  sodium chloride infusion  500 mL Intravenous Continuous Irene Shipper, MD        BP (!) 146/69 (BP Location: Left Arm, Cuff Size: Large)   Pulse 62   Temp 98.3 F (36.8 C) (Oral)   Resp 18   Ht 5' 4"  (1.626 m)   Wt 248 lb 12.8 oz (112.9 kg)   SpO2 100% Comment: room air  BMI 42.71 kg/m       Objective:   Physical Exam  Constitutional: She appears well-developed and well-nourished.  Cardiovascular: Normal rate, regular rhythm and normal heart sounds.   No murmur heard. Pulmonary/Chest: Effort normal and breath sounds normal. No respiratory distress. She has no wheezes.  Psychiatric: She has a normal mood and affect. Her behavior is normal. Judgment and thought content normal.          Assessment & Plan:

## 2016-10-03 NOTE — Progress Notes (Signed)
Pre visit review using our clinic review tool, if applicable. No additional management support is needed unless otherwise documented below in the visit note. 

## 2016-10-03 NOTE — Patient Instructions (Signed)
Continue current medications. Complete lab work prior to leaving.  

## 2016-10-03 NOTE — Assessment & Plan Note (Signed)
BP is improved. Continue current medications, obtain follow up bmet to assess K+.

## 2016-10-03 NOTE — Assessment & Plan Note (Signed)
Above goal, continue zetia, low fat/low cholesterol diet, exercise and weight loss efforts.

## 2016-10-14 ENCOUNTER — Other Ambulatory Visit: Payer: Self-pay | Admitting: Family

## 2016-10-14 NOTE — Telephone Encounter (Signed)
Refill sent per [facility] refill protocol/SLS  

## 2016-11-04 ENCOUNTER — Other Ambulatory Visit: Payer: Self-pay | Admitting: Family

## 2016-11-04 NOTE — Telephone Encounter (Signed)
We have never wrote for oxybutin for patient.  Do you want to refill for her?

## 2016-11-30 ENCOUNTER — Telehealth: Payer: Self-pay | Admitting: Oncology

## 2016-11-30 NOTE — Telephone Encounter (Signed)
Pt called to r/s appt due to weather conditions. Gave pt new appt date/time

## 2016-12-01 ENCOUNTER — Ambulatory Visit: Payer: Medicare Other | Admitting: Oncology

## 2016-12-05 ENCOUNTER — Other Ambulatory Visit: Payer: Self-pay | Admitting: Family

## 2016-12-15 ENCOUNTER — Ambulatory Visit (HOSPITAL_BASED_OUTPATIENT_CLINIC_OR_DEPARTMENT_OTHER): Payer: Medicare Other | Admitting: Oncology

## 2016-12-15 VITALS — BP 162/73 | HR 66 | Temp 97.8°F | Resp 18 | Ht 64.0 in | Wt 244.3 lb

## 2016-12-15 DIAGNOSIS — C50212 Malignant neoplasm of upper-inner quadrant of left female breast: Secondary | ICD-10-CM

## 2016-12-15 DIAGNOSIS — M8580 Other specified disorders of bone density and structure, unspecified site: Secondary | ICD-10-CM | POA: Diagnosis not present

## 2016-12-15 DIAGNOSIS — Z86000 Personal history of in-situ neoplasm of breast: Secondary | ICD-10-CM

## 2016-12-15 DIAGNOSIS — Z170 Estrogen receptor positive status [ER+]: Principal | ICD-10-CM

## 2016-12-15 DIAGNOSIS — Z853 Personal history of malignant neoplasm of breast: Secondary | ICD-10-CM

## 2016-12-15 NOTE — Progress Notes (Signed)
[facility]  Telephone:([phone]) [phone] Fax:(336) [phone]     ID: Kelly Bell DOB: 1941/04/18  MR#: [id number]  RKY#:[id number]  Patient Care Team: Debbrah Alar, NP as PCP - General (Internal Medicine) Marybelle Killings, MD (Orthopedic Surgery) Stark Klein, MD as Consulting Physician (General Surgery) Chauncey Cruel, MD as Consulting Physician (Oncology) Kyung Rudd, MD as Consulting Physician (Radiation Oncology) Rockwell Germany, RN as Registered Nurse Mauro Kaufmann, RN as Registered Nurse Sylvan Cheese, NP as Nurse Practitioner (Hematology and Oncology) PCP: Nance Pear., NP OTHER MD: Rodell Perna MD  CHIEF COMPLAINT: Estrogen receptor positive breast cancer  CURRENT TREATMENT: Observation   BREAST CANCER HISTORY: From the original intake note:  Francyne had a right lumpectomy in 1987 for which she describes as a "tiny" tumor which was lymph node negative. She had adjuvant radiation and then 5 years of tamoxifen under the care of Dr. Sonny Dandy.  More recently, bilateral screening mammography with tomosynthesis 08/25/2015 [facility] found a new cluster of calcifications in the upper inner quadrant of the left breast. Left diagnostic mammography 08/31/2015 showed the breast composition to be category C. There was a suspicious area of calcifications in the left breast upper inner quadrant measuring 4 cm. This was biopsied 09/01/2015, and showed ([id number]) ductal carcinoma in situ, low-grade, involving a papilloma. The estrogen receptor was under percent positive. The progesterone receptor was under percent positive, both with strong staining intensity.  The patient's subsequent history is as detailed below.   INTERVAL HISTORY: Varnell returns today for follow-up of her remote invasive breast cancer and her most recent noninvasive breast cancer. The interval history is generally unremarkable she is doing "pretty good", although some days she feels a little bit  down. Mostly she stays around the house doing nothing she gets a little depressed. With more activity she feels better.  REVIEW OF SYSTEMS: [facility] She does some weights in some machines. A detailed review of systems today was otherwise stable.  PAST MEDICAL HISTORY: Past Medical History:  Diagnosis Date  . Allergy   . Arthritis    knees  . Breast cancer ([place])    Right  . HTN (hypertension)   . Hyperlipidemia   . Hypertension   . Irritable bladder   . Knee pain    Bilateral  . SVD (spontaneous vaginal delivery)    x 2  . Type II or unspecified type diabetes mellitus without mention of complication, not stated as uncontrolled     PAST SURGICAL HISTORY: Past Surgical History:  Procedure Laterality Date  . ABDOMINAL HYSTERECTOMY  1975  . COLONOSCOPY  05/10/05, 2011   hx polyps  . MASTECTOMY PARTIAL / LUMPECTOMY W/ AXILLARY LYMPHADENECTOMY  07/1986  . MASTECTOMY W/ SENTINEL NODE BIOPSY Left 10/28/2015   Procedure: LEFT MASTECTOMY WITH SENTINEL LYMPH NODE BIOPSY;  Surgeon: Stark Klein, MD;  Location: [facility];  Service: General;  Laterality: Left;  . SHOULDER ARTHROSCOPY  2000    FAMILY HISTORY Family History  Problem Relation Age of Onset  . Diabetes Mother 48    Deceased  . Pancreatic cancer Mother     dx. early 64s  . Diabetes Father 65    Deceased  . Heart failure Father   . Heart disease Father     CHF  . Breast cancer Sister 57  . Cancer Brother     oral cancer - metastasis to lungs; smoker  . Corneal  Dystrophy Son     bilateral  . Healthy Daughter     x1  . Breast cancer Sister     dx. 66s  . Esophageal cancer Maternal Grandfather   . Rectal cancer Maternal Grandfather   . Stomach cancer Maternal Grandfather   . Cancer Cousin     oral cancer dx. <60; +EtOH, smoker; daughter of father's paternal half-brother   the patient's father died at the age of 6 from non-cancer related causes. The patient's mother  died at the age of 27, from pancreatic cancer. The patient had 4 brothers, 4 sisters. One sister died from breast cancer at the age of 17. One brother died from cancer of the head and neck. A second sister has been diagnosed recently with breast cancer. She is in her 85s. There is no history of ovarian cancer in the family  GYNECOLOGIC HISTORY:  No LMP recorded. Patient has had a hysterectomy. Menarche age 70, first live birth age 82, the patient is [place] P2. She underwent hysterectomy without salpingo-oophorectomy in 1975. She did not take hormone replacement. She took birth controls for some period of time in the past but does not recall how long. 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 She worked a total of 28 years in [place]. Her husband Ezell is a retired Chemical engineer. He used to work as a Company secretary as well. The patient's son Vicente Males is disabled and lives in [place]. The patient's daughter Luciana Axe also lives in [place]. She is a Theatre manager. The patient has 3 grandchildren. She attends a local [employer]: Not in place   HEALTH MAINTENANCE: Social History  Substance Use Topics  . Smoking status: Never Smoker  . Smokeless tobacco: Never Used  . Alcohol use Yes     Comment: occasional wine     Colonoscopy: 2011/lobe our  PAP:  Bone density: 10/16/2014/[place]/D- 1.5 (osteopenia)  Lipid panel:  Allergies  Allergen Reactions  . Pravastatin Other (See Comments)    Severe muscle cramps.  . Lisinopril     cough    Current Outpatient Prescriptions  Medication Sig Dispense Refill  . ACCU-CHEK AVIVA PLUS test strip USE TO CHECK BLOOD SUGAR ONCE DAILY 100 each 1  . aspirin 325 MG tablet Take 325 mg by mouth daily.      . Blood Glucose Monitoring Suppl (ACCU-CHEK AVIVA PLUS) w/Device KIT Use as instructed to check blood sugar once a day. DX  E11.9 1 kit 0  . Calcium Carbonate-Vitamin D (CALTRATE 600+D)  600-400 MG-UNIT per tablet Take 1 tablet by mouth 2 (two) times daily.    Marland Kitchen ezetimibe (ZETIA) 10 MG tablet TAKE ONE TABLET BY MOUTH ONCE DAILY 90 tablet 1  . fluticasone (FLONASE) 50 MCG/ACT nasal spray USE TWO SPRAY IN EACH NOSTRIL ONCE DAILY 48 g 1  . hydrochlorothiazide (HYDRODIURIL) 25 MG tablet TAKE 1/2 TABLET BY MOUTH EVERY DAY 45 tablet 1  . losartan (COZAAR) 100 MG tablet Take 1 tablet (100 mg total) by mouth daily. 90 tablet 1  . metoprolol succinate (TOPROL-XL) 25 MG 24 hr tablet TAKE ONE TABLET BY MOUTH ONCE DAILY 90 tablet 0  . nabumetone (RELAFEN) 500 MG tablet TAKE ONE TABLET BY MOUTH TWICE DAILY AS NEEDED FOR  ARTHRITIS  TAKE  AFTER  A  MEAL* 60 tablet 0  . oxybutynin (DITROPAN) 5 MG tablet TAKE ONE TABLET BY MOUTH 4 TIMES DAILY AS NEEDED 120 tablet  2  . pioglitazone (ACTOS) 30 MG tablet Take 1 tablet (30 mg total) by mouth daily. 90 tablet 0   Current Facility-Administered Medications  Medication Dose Route Frequency Provider Last Rate Last Dose  . 0.9 %  sodium chloride infusion  500 mL Intravenous Continuous Irene Shipper, MD        OBJECTIVE: Older African-American woman in no acute distress Vitals:   12/15/16 1118  BP: (!) 162/73  Pulse: 66  Resp: 18  Temp: 97.8 F (36.6 C)     Body mass index is 41.93 kg/m.    ECOG FS:1 - Symptomatic but completely ambulatory  Sclerae unicteric, EOMs intact Oropharynx clear and moist No cervical or supraclavicular adenopathy Lungs no rales or rhonchi Heart regular rate and rhythm Abd soft, nontender, positive bowel sounds MSK no focal spinal tenderness, no upper extremity lymphedema Neuro: nonfocal, well oriented, appropriate affect Breasts: Status post bilateral mastectomies. There is no evidence of chest wall recurrence Skin: She notes a prominence over the head of the sternum. That is probably just a slightly more noticeable fatty area because of the surgeries that she has undergone. I do not palpate anything suspicious for  breast cancer (no mass, no tenderness) graduated well  LAB RESULTS:  CMP     Component Value Date/Time   NA 140 10/03/2016 1138   NA 140 09/09/2015 1209   K 4.1 10/03/2016 1138   K 4.3 09/09/2015 1209   CL 108 10/03/2016 1138   CO2 27 10/03/2016 1138   CO2 26 09/09/2015 1209   GLUCOSE 105 (H) 10/03/2016 1138   GLUCOSE 123 09/09/2015 1209   BUN 12 10/03/2016 1138   BUN 15.2 09/09/2015 1209   CREATININE 0.77 10/03/2016 1138   CREATININE 0.9 09/09/2015 1209   CALCIUM 9.6 10/03/2016 1138   CALCIUM 10.3 09/09/2015 1209   PROT 7.3 09/09/2015 1209   ALBUMIN 4.1 09/09/2015 1209   AST 20 09/09/2015 1209   ALT 20 09/09/2015 1209   ALKPHOS 59 09/09/2015 1209   BILITOT 0.66 09/09/2015 1209   GFRNONAA >60 10/26/2015 1000   GFRNONAA 78 04/17/2014 1115   GFRAA >60 10/26/2015 1000   GFRAA 89 04/17/2014 1115    INo results found for: SPEP, UPEP  Lab Results  Component Value Date   WBC 3.2 (L) 09/09/2015   NEUTROABS 1.6 09/09/2015   HGB 14.0 09/09/2015   HCT 42.5 09/09/2015   MCV 94.2 09/09/2015   PLT 197 09/09/2015      Chemistry      Component Value Date/Time   NA 140 10/03/2016 1138   NA 140 09/09/2015 1209   K 4.1 10/03/2016 1138   K 4.3 09/09/2015 1209   CL 108 10/03/2016 1138   CO2 27 10/03/2016 1138   CO2 26 09/09/2015 1209   BUN 12 10/03/2016 1138   BUN 15.2 09/09/2015 1209   CREATININE 0.77 10/03/2016 1138   CREATININE 0.9 09/09/2015 1209      Component Value Date/Time   CALCIUM 9.6 10/03/2016 1138   CALCIUM 10.3 09/09/2015 1209   ALKPHOS 59 09/09/2015 1209   AST 20 09/09/2015 1209   ALT 20 09/09/2015 1209   BILITOT 0.66 09/09/2015 1209       No results found for: LABCA2  No components found for: LABCA125  No results for input(s): INR in the last 168 hours.  Urinalysis    Component Value Date/Time   COLORURINE LT. YELLOW 03/05/2013 0930   APPEARANCEUR CLEAR 03/05/2013 0930   LABSPEC 1.020 03/05/2013 0930  PHURINE 6.0 03/05/2013 0930    GLUCOSEU 100 03/05/2013 0930   HGBUR NEGATIVE 03/05/2013 0930   BILIRUBINUR NEGATIVE 03/05/2013 0930   KETONESUR NEGATIVE 03/05/2013 0930   UROBILINOGEN 0.2 03/05/2013 0930   NITRITE NEGATIVE 03/05/2013 0930   LEUKOCYTESUR NEGATIVE 03/05/2013 0930    STUDIES: No results found.  ASSESSMENT: 76 y.o. [place] woman  (1) status post right lumpectomy in 1987 for a stage I invasive breast cancer, treated with adjuvant radiation and tamoxifen for 5 years  (a) chronic right upper extremity lymphedema, grade 1  (2) status post left breast upper inner quadrant biopsy 09/01/2015 for ductal carcinoma in situ, low-grade, extending approximately 4 cm, estrogen and progesterone receptor positive  (3) status post left mastectomy and sentinel lymph node sampling 10/28/2015 for ductal carcinoma in situ, grade 1, measuring 3.5 cm, with ample margins, and all 3 sentinel lymph nodes clear  (4) genetics testing declined by patient  (5) adjuvant tamoxifen started 12/02/2015. Stopped in April 2017  (6) osteopenia, with bone density December 2015 showing a T score of -1.5  PLAN: Sierra is now more than 20 years out from her invasive cancer. As far as her noninvasive cancer she understands that that is generally cured with the surgery she had.  Accordingly at this point I feel comfortable releasing her to her primary care physician. Although she will need in terms of breast cancer follow-up is a yearly physician chest wall exam  I discussed our survivorship program with her. At this point she would prefer not to make a definite appointment for that.  I will be glad to see me again at any point in the future if on when the need arises, but as of now more making no further routine appointments for her here. Chauncey Cruel, MD   12/15/2016 11:26 AM

## 2017-01-03 ENCOUNTER — Ambulatory Visit (INDEPENDENT_AMBULATORY_CARE_PROVIDER_SITE_OTHER): Payer: Medicare Other | Admitting: Family

## 2017-01-03 ENCOUNTER — Encounter: Payer: Self-pay | Admitting: Family

## 2017-01-03 VITALS — BP 147/64 | HR 49 | Temp 97.7°F | Ht 64.0 in | Wt 242.6 lb

## 2017-01-03 DIAGNOSIS — E785 Hyperlipidemia, unspecified: Secondary | ICD-10-CM | POA: Diagnosis not present

## 2017-01-03 DIAGNOSIS — E119 Type 2 diabetes mellitus without complications: Secondary | ICD-10-CM

## 2017-01-03 DIAGNOSIS — I10 Essential (primary) hypertension: Secondary | ICD-10-CM | POA: Diagnosis not present

## 2017-01-03 LAB — BASIC METABOLIC PANEL
BUN: 9 mg/dL (ref 6–23)
CO2: 29 mEq/L (ref 19–32)
Calcium: 9.7 mg/dL (ref 8.4–10.5)
Chloride: 106 mEq/L (ref 96–112)
Creatinine, Ser: 0.85 mg/dL (ref 0.40–1.20)
GFR: 83.41 mL/min (ref >60.00)
Glucose, Bld: 104 mg/dL — ABNORMAL HIGH (ref 70–99)
POTASSIUM: 4.1 meq/L (ref 3.5–5.1)
Sodium: 139 mEq/L (ref 135–145)

## 2017-01-03 LAB — HEMOGLOBIN A1C: HEMOGLOBIN A1C: 5.9 % (ref 4.6–6.5)

## 2017-01-03 MED ORDER — PIOGLITAZONE HCL 15 MG PO TABS: 15.0000 mg | ORAL_TABLET | Freq: Every day | ORAL | 5 refills | Status: DC

## 2017-01-03 MED ORDER — FLUTICASONE PROPIONATE 50 MCG/ACT NA SUSP: NASAL | 1 refills | Status: DC

## 2017-01-03 NOTE — Progress Notes (Signed)
Pre visit review using our clinic review tool, if applicable. No additional management support is needed unless otherwise documented below in the visit note. 

## 2017-01-03 NOTE — Assessment & Plan Note (Signed)
It sounds like she is having some mild hypoglycemia. Will decrease actos from 30mg  to 15mg .

## 2017-01-03 NOTE — Progress Notes (Signed)
Subjective:    Patient ID: Kelly Bell, female    DOB: Dec 01, 1940, 76 y.o.   MRN: [id number]  HPI  Kelly Bell is a 76 yr old female who presents today for follow up.  1) HTN- currently maintained on losartan, hctz and metoprolol.  BP Readings from Last 3 Encounters:  01/03/17 (!) 147/64  12/15/16 (!) 162/73  10/03/16 (!) 146/69   2) Hyperlipidemia- maintained on zetia. Tolerating without difficulty.  Lab Results  Component Value Date   CHOL 204 (H) 09/07/2016   HDL 40.60 09/07/2016   LDLCALC 133 (H) 09/07/2016   LDLDIRECT 111.5 01/01/2007   TRIG 152.0 (H) 09/07/2016   CHOLHDL 5 09/07/2016   3) DM2- continues actos. Reports that her sugars have been stable at home. Notes that she sometimes feels like her sugar drops.   Lab Results  Component Value Date   HGBA1C 5.7 09/07/2016   HGBA1C 6.3 03/09/2016   HGBA1C 5.7 08/07/2015   Lab Results  Component Value Date   MICROALBUR 0.7 08/07/2015   LDLCALC 133 (H) 09/07/2016   CREATININE 0.77 10/03/2016      Review of Systems  Respiratory: Negative for shortness of breath.   Cardiovascular: Negative for chest pain and leg swelling.       Past Medical History:  Diagnosis Date  . Allergy   . Arthritis    knees  . Breast cancer ([place])    Right  . HTN (hypertension)   . Hyperlipidemia   . Hypertension   . Irritable bladder   . Knee pain    Bilateral  . SVD (spontaneous vaginal delivery)    x 2  . Type II or unspecified type diabetes mellitus without mention of complication, not stated as uncontrolled      Social History   Social History  . Marital status: Married    Spouse name: N/A  . Number of children: 2  . Years of education: 12   Occupational History  . 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 Smoking status: Never Smoker  . Smokeless tobacco: Never Used  . Alcohol use Yes     Comment: occasional wine  . Drug use: No  . Sexual activity: Not  Currently    Birth control/ protection: Post-menopausal     Comment: Hysterectomy   Other Topics Concern  . Not on file   Social History Narrative   HSG. Married 1965. 1 son - '68, 1 daughter-'72; 3 grandsons. 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                Past Surgical History:  Procedure Laterality Date  . ABDOMINAL HYSTERECTOMY  1975  . COLONOSCOPY  05/10/05, 2011   hx polyps  . MASTECTOMY PARTIAL / LUMPECTOMY W/ AXILLARY LYMPHADENECTOMY  07/1986  . MASTECTOMY W/ SENTINEL NODE BIOPSY Left 10/28/2015   Procedure: LEFT MASTECTOMY WITH SENTINEL LYMPH NODE BIOPSY;  Surgeon: Stark Klein, MD;  Location: [facility];  Service: General;  Laterality: Left;  . SHOULDER ARTHROSCOPY  2000    Family History  Problem Relation Age of Onset  . Diabetes Mother 79    Deceased  . Pancreatic cancer Mother     dx. early 57s  . Diabetes Father 45    Deceased  . Heart failure Father   . Heart disease Father     CHF  . Breast cancer Sister 83  .  Cancer Brother     oral cancer - metastasis to lungs; smoker  . Corneal Dystrophy Son     bilateral  . Healthy Daughter     x1  . Breast cancer Sister     dx. 28s  . Esophageal cancer Maternal Grandfather   . Rectal cancer Maternal Grandfather   . Stomach cancer Maternal Grandfather   . Cancer Cousin     oral cancer dx. <60; +EtOH, smoker; daughter of father's paternal half-brother    Allergies  Allergen Reactions  . Pravastatin Other (See Comments)    Severe muscle cramps.  . Lisinopril     cough    Current Outpatient Prescriptions on File Prior to Visit  Medication Sig Dispense Refill  . ACCU-CHEK AVIVA PLUS test strip USE TO CHECK BLOOD SUGAR ONCE DAILY 100 each 1  . aspirin 325 MG tablet Take 325 mg by mouth daily.      . Blood Glucose Monitoring Suppl (ACCU-CHEK AVIVA PLUS) w/Device KIT Use as instructed to check blood sugar once a day. DX  E11.9 1 kit 0  . Calcium Carbonate-Vitamin D  (CALTRATE 600+D) 600-400 MG-UNIT per tablet Take 1 tablet by mouth 2 (two) times daily.    Marland Kitchen ezetimibe (ZETIA) 10 MG tablet TAKE ONE TABLET BY MOUTH ONCE DAILY 90 tablet 1  . fluticasone (FLONASE) 50 MCG/ACT nasal spray USE TWO SPRAY IN EACH NOSTRIL ONCE DAILY 48 g 1  . hydrochlorothiazide (HYDRODIURIL) 25 MG tablet TAKE 1/2 TABLET BY MOUTH EVERY DAY 45 tablet 1  . losartan (COZAAR) 100 MG tablet Take 1 tablet (100 mg total) by mouth daily. 90 tablet 1  . metoprolol succinate (TOPROL-XL) 25 MG 24 hr tablet TAKE ONE TABLET BY MOUTH ONCE DAILY 90 tablet 0  . nabumetone (RELAFEN) 500 MG tablet TAKE ONE TABLET BY MOUTH TWICE DAILY AS NEEDED FOR  ARTHRITIS  TAKE  AFTER  A  MEAL* 60 tablet 0  . oxybutynin (DITROPAN) 5 MG tablet TAKE ONE TABLET BY MOUTH 4 TIMES DAILY AS NEEDED 120 tablet 2  . pioglitazone (ACTOS) 30 MG tablet Take 1 tablet (30 mg total) by mouth daily. 90 tablet 0  . [DISCONTINUED] oxybutynin (DITROPAN) 5 MG tablet TAKE ONE TABLET BY MOUTH 4 TIMES DAILY AS NEEDED 120 tablet 5   Current Facility-Administered Medications on File Prior to Visit  Medication Dose Route Frequency Provider Last Rate Last Dose  . 0.9 %  sodium chloride infusion  500 mL Intravenous Continuous Irene Shipper, MD        BP (!) 147/64 (BP Location: Left Arm, Patient Position: Sitting, Cuff Size: Large)   Pulse (!) 49   Temp 97.7 F (36.5 C) (Oral)   Ht 5' 4"  (1.626 m)   Wt 242 lb 9.6 oz (110 kg)   SpO2 100%   BMI 41.64 kg/m    Objective:   Physical Exam  Constitutional: She appears well-developed and well-nourished.  HENT:  Head: Normocephalic and atraumatic.  Cardiovascular: Normal rate, regular rhythm and normal heart sounds.   No murmur heard. Pulmonary/Chest: Effort normal and breath sounds normal. No respiratory distress. She has no wheezes.  Psychiatric: She has a normal mood and affect. Her behavior is normal. Judgment and thought content normal.          Assessment & Plan:

## 2017-01-03 NOTE — Assessment & Plan Note (Signed)
Tolerating zetia, intolerant to statin. Continue zetia.

## 2017-01-03 NOTE — Patient Instructions (Addendum)
Decrease actos from 30mg  to 15 mg.  Complete lab work prior to leaving.

## 2017-01-18 ENCOUNTER — Other Ambulatory Visit: Payer: Self-pay | Admitting: Family

## 2017-03-13 ENCOUNTER — Other Ambulatory Visit: Payer: Self-pay | Admitting: Family

## 2017-03-13 NOTE — Telephone Encounter (Signed)
Refill sent per [facility] refill protocol/SLS  

## 2017-04-17 IMAGING — MR MR BREAST BILAT WO/W CM
8 of 13 series · 31 of 48 positions shown · IV contrast (multihance)
Comparison: Prior mammograms from [facility]

CLINICAL DATA: 74-year-old female with newly diagnosed left breast
DCIS. History of previous right breast cancer, lumpectomy and
treatment.

LABS:  None performed today
EXAM:
BILATERAL BREAST MRI WITH AND WITHOUT CONTRAST
TECHNIQUE: Multiplanar, multisequence MR images of both breasts were obtained
prior to and following the intravenous administration of 20 ml of
MultiHance.

[Series 2: T2 · axial · 3.0mm · 0.94mm/px · z∈[-76,+86]mm · 3 of 55 slices shown]
[im 1/55]
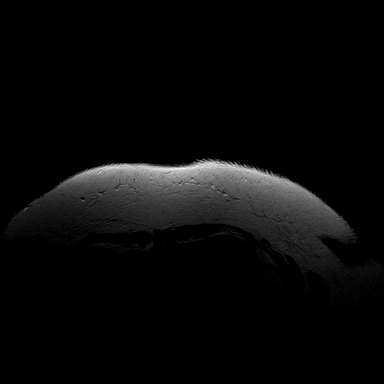
[im 28/55]
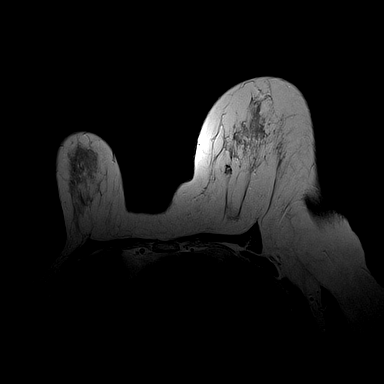
[im 55/55]
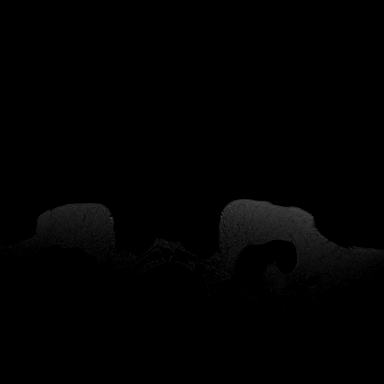

[Series 3: t2_tirm_tra ipat (a-p) · axial · 3.0mm · 0.70mm/px · z∈[-76,+86]mm · 2 of 55 slices shown]
[im 1/55]
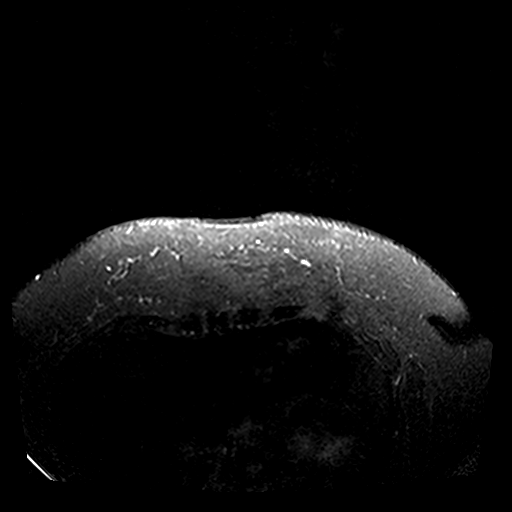
[im 55/55]
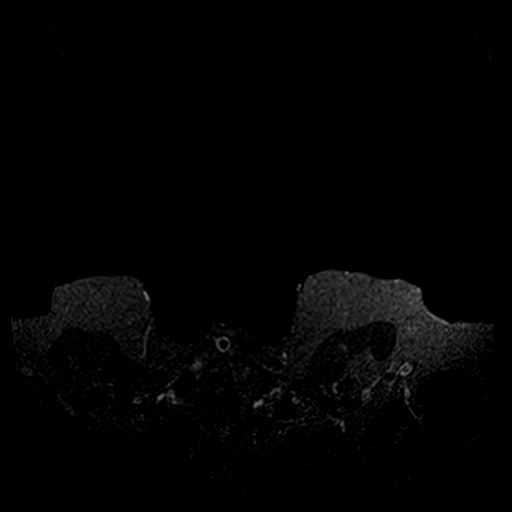

[Series 4: fl3d pre-cm no · axial · non-contrast · 1.2mm · 0.94mm/px · z∈[-80,+91]mm · 5 of 144 slices shown]
[im 1/144]
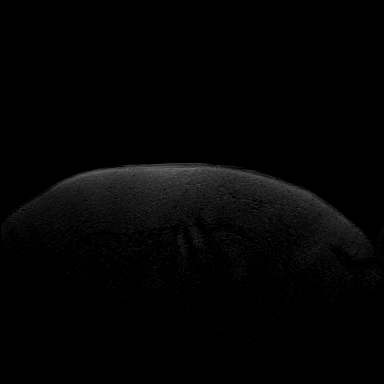
[im 36/144]
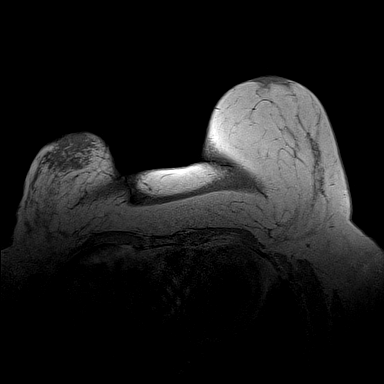
[im 72/144]
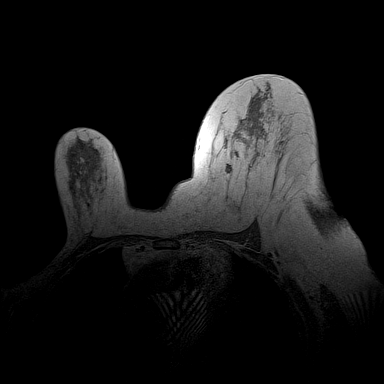
[im 108/144]
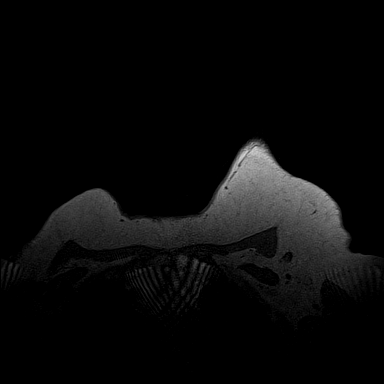
[im 144/144]
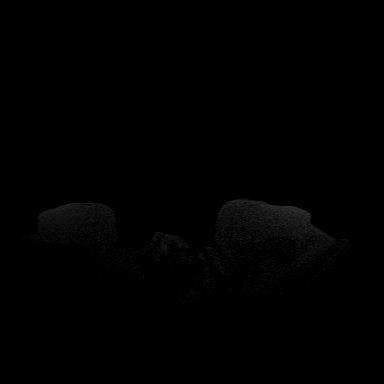

[Series 5: fl3d pre-cm · axial · non-contrast · 1.2mm · 0.94mm/px · z∈[-80,+91]mm · 5 of 144 slices shown]
[im 1/144]
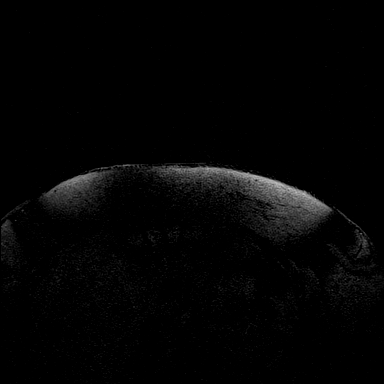
[im 36/144]
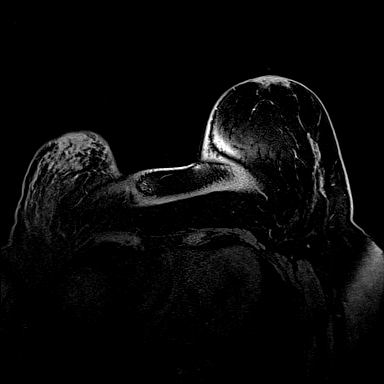
[im 72/144]
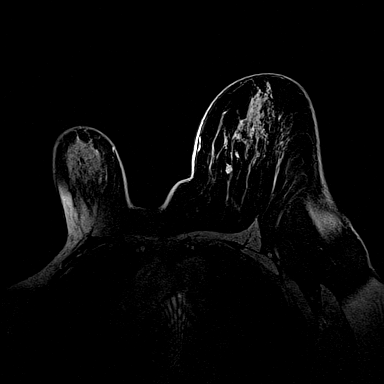
[im 108/144]
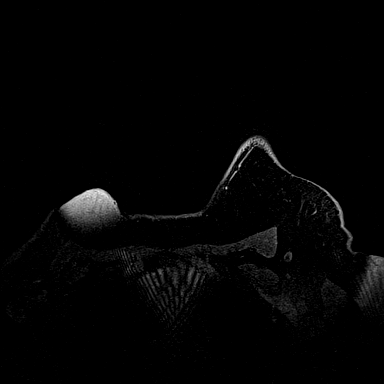
[im 144/144]
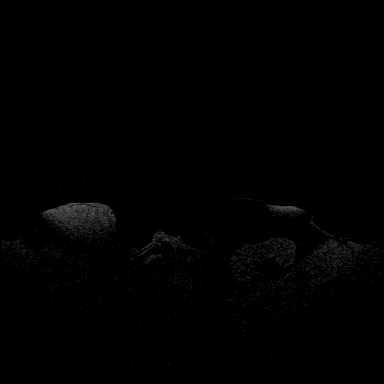

[Series 6: fl3d post-cm 20 · axial · 1.2mm · 0.94mm/px · z∈[-80,+91]mm · 5 of 144 slices shown (1 of 3)]
[im 1/144]
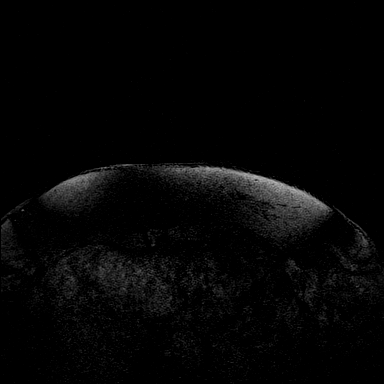
[im 36/144]
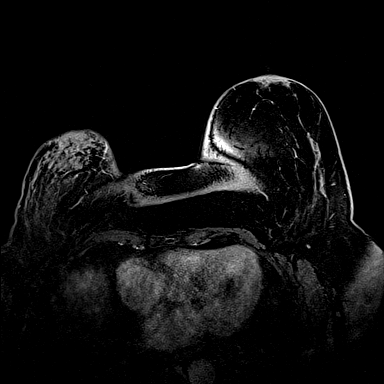
[im 72/144]
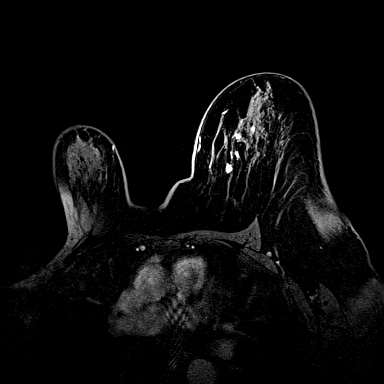
[im 108/144]
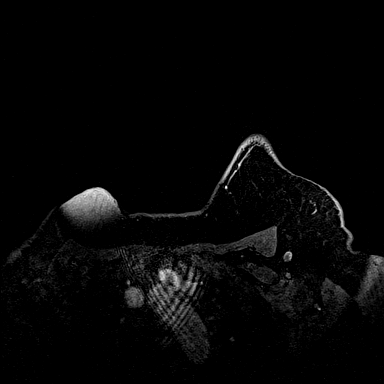
[im 144/144]
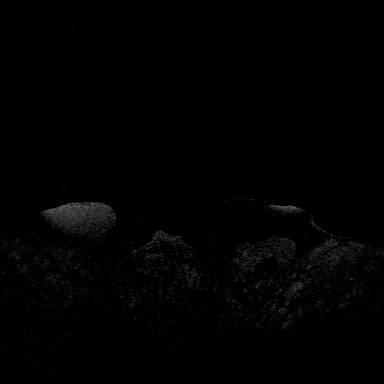

[Series 7: fl3d post-cm 20 · axial · 1.2mm · 0.94mm/px · z∈[-80,+91]mm · 5 of 144 slices shown (2 of 3)]
[im 1/144]
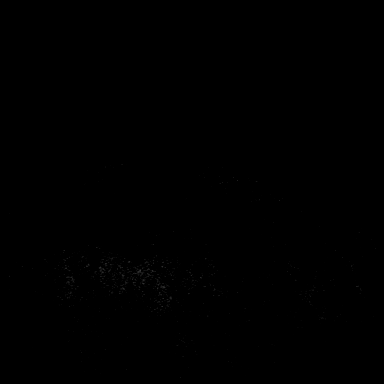
[im 36/144]
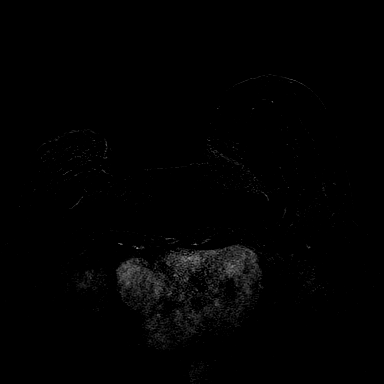
[im 72/144]
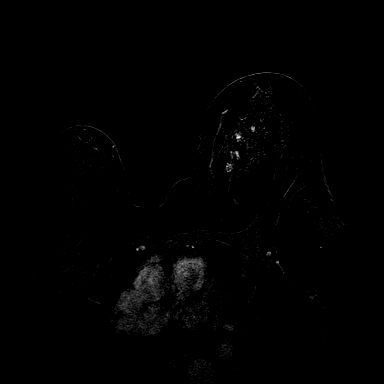
[im 108/144]
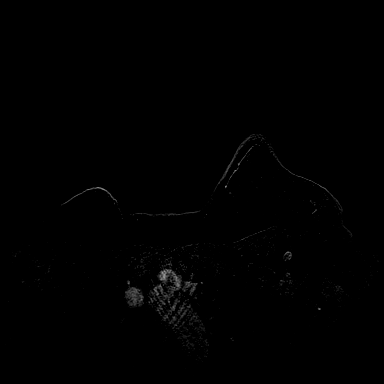
[im 144/144]
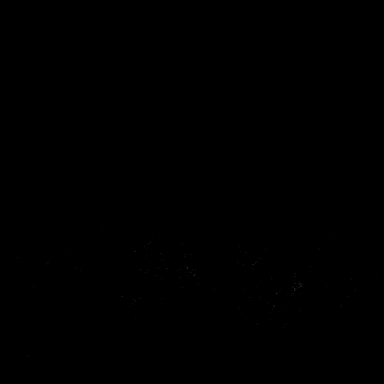

[Series 8: fl3d post-cm 20 · axial · 172.8mm · 0.94mm/px · 1 of 1 slices shown (3 of 3)]
[im 1/1]
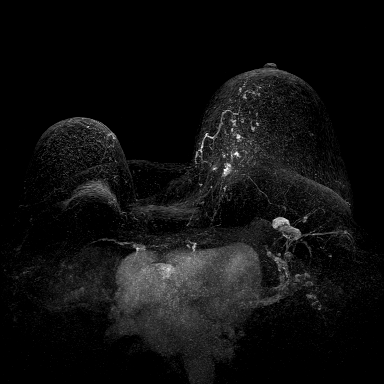

[Series 9: fl3d post-cm 3min · axial · 1.2mm · 0.94mm/px · z∈[-80,+91]mm · 5 of 144 slices shown]
[im 1/144]
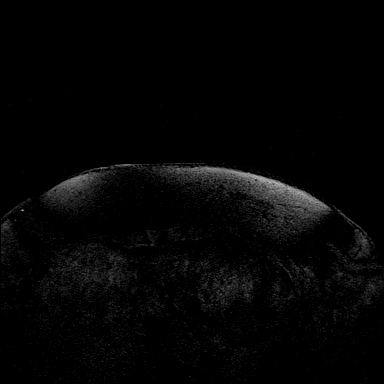
[im 36/144]
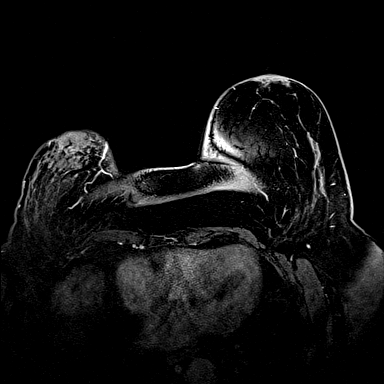
[im 72/144]
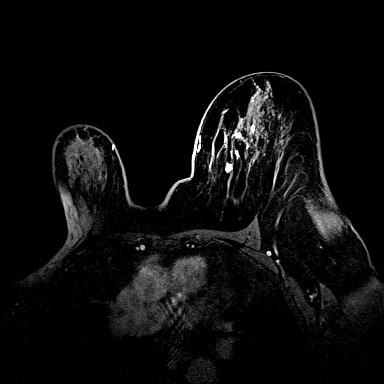
[im 108/144]
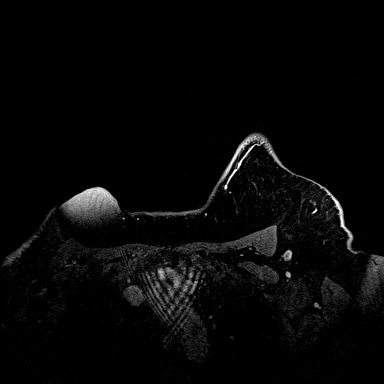
[im 144/144]
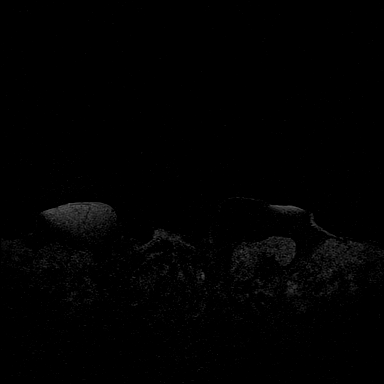

[31 of 48 positions shown; findings below may reference images not displayed]

THREE-DIMENSIONAL MR IMAGE RENDERING ON INDEPENDENT WORKSTATION:

Three-dimensional MR images were rendered by post-processing of the
original MR data on an independent workstation. The
three-dimensional MR images were interpreted, and findings are
reported in the following complete MRI report for this study. Three
dimensional images were evaluated at the independent [employer]
workstation
FINDINGS: Breast composition: c. Heterogeneous fibroglandular tissue.

Background parenchymal enhancement: Mild

Right breast: No mass or abnormal enhancement. The right breast is
much smaller than the left.

Left breast: Irregular linear and nodular enhancement within the
upper inner left breast is identified spanning an area measuring 4 x
10 x 3 cm (transverse x AP x CC) and involves the anterior, middle
and posterior depths. Biopsy clip artifact along the posterior
superior aspect of this abnormal enhancement is noted.

No other suspicious areas of enhancement are identified.

Lymph nodes: No suspicious lymph nodes identified.

Ancillary findings:  Cardiomegaly identified.
IMPRESSION: 4 x 10 x 3 cm area of irregular linear nodular enhancement within
the upper inner left breast with biopsy clip artifact along the
posterior superior aspect of this enhancement - with the entire area
likely representing malignancy/DCIS.

No evidence of contralateral malignancy or suspicious lymph nodes.

Small right breast compatible with postsurgical/treatment changes.

RECOMMENDATION:
Treatment plan. Consider sampling of anterior aspect of the abnormal
upper inner left breast enhancement if breast conservation is
desired.

BI-RADS CATEGORY  6: Known biopsy-proven malignancy.

## 2017-05-01 ENCOUNTER — Other Ambulatory Visit: Payer: Self-pay | Admitting: Family

## 2017-05-01 NOTE — Telephone Encounter (Signed)
Refill sent per [facility] refill protocol/SLS  

## 2017-05-03 ENCOUNTER — Ambulatory Visit (INDEPENDENT_AMBULATORY_CARE_PROVIDER_SITE_OTHER): Payer: Medicare Other | Admitting: Family

## 2017-05-03 ENCOUNTER — Telehealth: Payer: Self-pay | Admitting: Family

## 2017-05-03 VITALS — BP 147/54 | HR 48 | Temp 98.4°F | Resp 16 | Ht 64.0 in | Wt 237.2 lb

## 2017-05-03 DIAGNOSIS — N3281 Overactive bladder: Secondary | ICD-10-CM | POA: Diagnosis not present

## 2017-05-03 DIAGNOSIS — E785 Hyperlipidemia, unspecified: Secondary | ICD-10-CM | POA: Diagnosis not present

## 2017-05-03 DIAGNOSIS — I10 Essential (primary) hypertension: Secondary | ICD-10-CM

## 2017-05-03 DIAGNOSIS — E119 Type 2 diabetes mellitus without complications: Secondary | ICD-10-CM

## 2017-05-03 LAB — COMPREHENSIVE METABOLIC PANEL
ALBUMIN: 4 g/dL (ref 3.5–5.2)
ALT: 13 U/L (ref 0–35)
AST: 14 U/L (ref 0–37)
Alkaline Phosphatase: 49 U/L (ref 39–117)
BUN: 11 mg/dL (ref 6–23)
CALCIUM: 9.9 mg/dL (ref 8.4–10.5)
CHLORIDE: 105 meq/L (ref 96–112)
CO2: 29 mEq/L (ref 19–32)
CREATININE: 0.76 mg/dL (ref 0.40–1.20)
GFR: 94.83 mL/min (ref >60.00)
Glucose, Bld: 117 mg/dL — ABNORMAL HIGH (ref 70–99)
Potassium: 4 mEq/L (ref 3.5–5.1)
Sodium: 139 mEq/L (ref 135–145)
Total Bilirubin: 0.6 mg/dL (ref 0.2–1.2)
Total Protein: 6.4 g/dL (ref 6.0–8.3)

## 2017-05-03 LAB — HEMOGLOBIN A1C: Hgb A1c MFr Bld: 6.1 % (ref 4.6–6.5)

## 2017-05-03 NOTE — Assessment & Plan Note (Signed)
Tolerating zetia "some of the time." fair lipids, continue as tolerated.

## 2017-05-03 NOTE — Progress Notes (Signed)
 Subjective:    Patient ID: Kelly Bell, female    DOB: 06/13/1941, 76 y.o.   MRN: [id number]  HPI  Kelly Bell is a 76 yr old female who presents today for follow up.  1) HTN- currently maintained on toprol xl, losartan.  BP Readings from Last 3 Encounters:  05/03/17 (!) 147/54  01/03/17 (!) 147/64  12/15/16 (!) 162/73   2) Hyperlipidemia- maintained on zetia.  Takes "sometimes" can cause muscle pain. Intolerant to statins.  Lab Results  Component Value Date   CHOL 204 (H) 09/07/2016   HDL 40.60 09/07/2016   LDLCALC 133 (H) 09/07/2016   LDLDIRECT 111.5 01/01/2007   TRIG 152.0 (H) 09/07/2016   CHOLHDL 5 09/07/2016   3) DM2- she is maintained on low dose actos. Fair diet.  Lab Results  Component Value Date   HGBA1C 5.9 01/03/2017   HGBA1C 5.7 09/07/2016   HGBA1C 6.3 03/09/2016   Lab Results  Component Value Date   MICROALBUR 0.7 08/07/2015   LDLCALC 133 (H) 09/07/2016   CREATININE 0.85 01/03/2017   4) overactive bladder- maintained on ditropan. Denies frequency.    Review of Systems  Respiratory: Negative for shortness of breath.   Cardiovascular: Negative for leg swelling.       Past Medical History:  Diagnosis Date  . Allergy   . Arthritis    knees  . Breast cancer (HCC)    Right  . HTN (hypertension)   . Hyperlipidemia   . Hypertension   . Irritable bladder   . Knee pain    Bilateral  . SVD (spontaneous vaginal delivery)    x 2  . Type II or unspecified type diabetes mellitus without mention of complication, not stated as uncontrolled      Social History   Social History  . Marital status: Married    Spouse name: N/A  . Number of children: 2  . Years of education: 12   Occupational History  . 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 Smoking status: Never Smoker  . Smokeless tobacco: Never Used  . Alcohol use Yes     Comment: occasional wine  . Drug use: No  . Sexual activity: Not  Currently    Birth control/ protection: Post-menopausal     Comment: Hysterectomy   Other Topics Concern  . Not on file   Social History Narrative   HSG. Married 1965. 1 son - '68, 1 daughter-'72; 3 grandsons. Retired - mfg, teacher's assist. SO - pretty good health.                Past Surgical History:  Procedure Laterality Date  . ABDOMINAL HYSTERECTOMY  1975  . COLONOSCOPY  05/10/05, 2011   hx polyps  . MASTECTOMY PARTIAL / LUMPECTOMY W/ AXILLARY LYMPHADENECTOMY  07/1986  . MASTECTOMY W/ SENTINEL NODE BIOPSY Left 10/28/2015   Procedure: LEFT MASTECTOMY WITH SENTINEL LYMPH NODE BIOPSY;  Surgeon: Faera Byerly, MD;  Location: [facility];  Service: General;  Laterality: Left;  . SHOULDER ARTHROSCOPY  2000    Family History  Problem Relation Age of Onset  . Diabetes Mother 70       Deceased  . Pancreatic cancer Mother        dx. early 70s  . Diabetes Father 70       Deceased  . Heart failure Father   . Heart disease Father          CHF  . Breast cancer Sister 72  . Cancer Brother        oral cancer - metastasis to lungs; smoker  . Corneal Dystrophy Son        bilateral  . Healthy Daughter        x1  . Breast cancer Sister        dx. 49s  . Esophageal cancer Maternal Grandfather   . Rectal cancer Maternal Grandfather   . Stomach cancer Maternal Grandfather   . Cancer Cousin        oral cancer dx. <60; +EtOH, smoker; daughter of father's paternal half-brother    Allergies  Allergen Reactions  . Pravastatin Other (See Comments)    Severe muscle cramps.  . Lisinopril     cough    Current Outpatient Prescriptions on File Prior to Visit  Medication Sig Dispense Refill  . ACCU-CHEK AVIVA PLUS test strip USE TO CHECK BLOOD SUGAR ONCE DAILY 100 each 1  . aspirin 325 MG tablet Take 325 mg by mouth daily.      . Blood Glucose Monitoring Suppl (ACCU-CHEK AVIVA PLUS) w/Device KIT Use as instructed to check blood sugar once a day. DX  E11.9 1 kit 0  .  Calcium Carbonate-Vitamin D (CALTRATE 600+D) 600-400 MG-UNIT per tablet Take 1 tablet by mouth 2 (two) times daily.    Marland Kitchen ezetimibe (ZETIA) 10 MG tablet TAKE ONE TABLET BY MOUTH ONCE DAILY 90 tablet 1  . fluticasone (FLONASE) 50 MCG/ACT nasal spray USE TWO SPRAY IN EACH NOSTRIL ONCE DAILY 48 g 1  . hydrochlorothiazide (HYDRODIURIL) 25 MG tablet TAKE ONE-HALF TABLET BY MOUTH ONCE DAILY 45 tablet 0  . losartan (COZAAR) 100 MG tablet TAKE ONE TABLET BY MOUTH ONCE DAILY 90 tablet 0  . metoprolol succinate (TOPROL-XL) 25 MG 24 hr tablet TAKE ONE TABLET BY MOUTH ONCE DAILY 90 tablet 1  . nabumetone (RELAFEN) 500 MG tablet TAKE ONE TABLET BY MOUTH TWICE DAILY AS NEEDED FOR  ARTHRITIS  TAKE  AFTER  A  MEAL* 60 tablet 0  . oxybutynin (DITROPAN) 5 MG tablet TAKE ONE TABLET BY MOUTH 4 TIMES DAILY AS NEEDED 120 tablet 2  . pioglitazone (ACTOS) 15 MG tablet Take 1 tablet (15 mg total) by mouth daily. 30 tablet 5  . [DISCONTINUED] oxybutynin (DITROPAN) 5 MG tablet TAKE ONE TABLET BY MOUTH 4 TIMES DAILY AS NEEDED 120 tablet 5   Current Facility-Administered Medications on File Prior to Visit  Medication Dose Route Frequency Provider Last Rate Last Dose  . 0.9 %  sodium chloride infusion  500 mL Intravenous Continuous Irene Shipper, MD        BP (!) 147/54 (BP Location: Left Arm, Cuff Size: Large)   Pulse (!) 48   Temp 98.4 F (36.9 C) (Oral)   Resp 16   Ht 5' 4" (1.626 m)   Wt 237 lb 3.2 oz (107.6 kg)   SpO2 100%   BMI 40.72 kg/m    Objective:   Physical Exam  Constitutional: She is oriented to person, place, and time. She appears well-developed and well-nourished.  Cardiovascular: Normal rate, regular rhythm and normal heart sounds.   No murmur heard. Pulmonary/Chest: Effort normal and breath sounds normal. No respiratory distress. She has no wheezes.  Musculoskeletal: She exhibits no edema.  Neurological: She is alert and oriented to person, place, and time.  Psychiatric: She has a normal mood  and affect. Her behavior is normal. Judgment and thought content normal.  Assessment & Plan:  We did discuss mammogram of her right breast (s/p left mastectomy) She wishes to think about this and will let me know if she wishes to proceed.

## 2017-05-03 NOTE — Assessment & Plan Note (Addendum)
Stable on current medications. Continue same. 

## 2017-05-03 NOTE — Patient Instructions (Addendum)
Please complete lab work prior to leaving.   

## 2017-05-03 NOTE — Assessment & Plan Note (Signed)
Stable on ditropan, continue same.

## 2017-05-03 NOTE — Assessment & Plan Note (Signed)
Clinically stable on actos, continue same, obtain follow up A1c.

## 2017-05-08 NOTE — Telephone Encounter (Signed)
error:[id number] ° °

## 2017-05-14 LAB — HM DIABETES EYE EXAM

## 2017-05-25 ENCOUNTER — Other Ambulatory Visit: Payer: Self-pay | Admitting: Family

## 2017-06-13 NOTE — Progress Notes (Deleted)
Subjective:   Kelly Bell is a 76 y.o. female who presents for Medicare Annual (Subsequent) preventive examination.  Review of Systems:  No ROS.  Medicare Wellness Visit. Additional risk factors are reflected in the social history.    Sleep patterns:   Home Safety/Smoke Alarms: Feels safe in home. Smoke alarms in place.  Living environment; residence and Firearm Safety:  [place] Safety/Bike Helmet: Wears seat belt.   Counseling:   Eye Exam-  Dental-  Female:   Pap- Hysterectomy      Mammo- Last 09/14/15: Right breast normal. Left breast mastectomy on 10/28/15. Dexa scan-  Last 10/16/14:  osteopenia     CCS- Last 06/28/16: diverticulosis. No longer doing routine screening due to age.     Objective:     Vitals: There were no vitals taken for this visit.  There is no height or weight on file to calculate BMI.   Tobacco History  Smoking Status  . Never Smoker  Smokeless Tobacco  . Never Used     Counseling given: Not Answered   Past Medical History:  Diagnosis Date  . Allergy   . Arthritis    knees  . Breast cancer ([facility])    Right  . HTN (hypertension)   . Hyperlipidemia   . Hypertension   . Irritable bladder   . Knee pain    Bilateral  . SVD (spontaneous vaginal delivery)    x 2  . Type II or unspecified type diabetes mellitus without mention of complication, not stated as uncontrolled    Past Surgical History:  Procedure Laterality Date  . ABDOMINAL HYSTERECTOMY  1975  . COLONOSCOPY  05/10/05, 2011   hx polyps  . MASTECTOMY PARTIAL / LUMPECTOMY W/ AXILLARY LYMPHADENECTOMY  07/1986  . MASTECTOMY W/ SENTINEL NODE BIOPSY Left 10/28/2015   Procedure: LEFT MASTECTOMY WITH SENTINEL LYMPH NODE BIOPSY;  Surgeon: Stark Klein, MD;  Location: [facility];  Service: General;  Laterality: Left;  . SHOULDER ARTHROSCOPY  2000   Family History  Problem Relation Age of Onset  . Diabetes Mother 58       Deceased  . Pancreatic cancer Mother    dx. early 35s  . Diabetes Father 84       Deceased  . Heart failure Father   . Heart disease Father        CHF  . Breast cancer Sister 27  . Cancer Brother        oral cancer - metastasis to lungs; smoker  . Corneal Dystrophy Son        bilateral  . Healthy Daughter        x1  . Breast cancer Sister        dx. 35s  . Esophageal cancer Maternal Grandfather   . Rectal cancer Maternal Grandfather   . Stomach cancer Maternal Grandfather   . Cancer Cousin        oral cancer dx. <60; +EtOH, smoker; daughter of father's paternal half-brother   History  Sexual Activity  . Sexual activity: Not Currently  . Birth control/ protection: Post-menopausal    Comment: Hysterectomy    Outpatient Encounter Prescriptions as of 06/20/2017  Medication Sig  . ACCU-CHEK AVIVA PLUS test strip USE TO CHECK BLOOD SUGAR ONCE DAILY  . aspirin 325 MG tablet Take 325 mg by mouth daily.    . Blood Glucose Monitoring Suppl (ACCU-CHEK AVIVA PLUS) w/Device KIT Use as instructed to check blood sugar once a day. DX  E11.9  . Calcium Carbonate-Vitamin D (CALTRATE 600+D) 600-400 MG-UNIT per tablet Take 1 tablet by mouth 2 (two) times daily.  Marland Kitchen ezetimibe (ZETIA) 10 MG tablet TAKE ONE TABLET BY MOUTH ONCE DAILY  . fluticasone (FLONASE) 50 MCG/ACT nasal spray USE TWO SPRAY IN EACH NOSTRIL ONCE DAILY  . hydrochlorothiazide (HYDRODIURIL) 25 MG tablet TAKE ONE-HALF TABLET BY MOUTH ONCE DAILY  . losartan (COZAAR) 100 MG tablet TAKE ONE TABLET BY MOUTH ONCE DAILY  . metoprolol succinate (TOPROL-XL) 25 MG 24 hr tablet TAKE ONE TABLET BY MOUTH ONCE DAILY  . nabumetone (RELAFEN) 500 MG tablet TAKE ONE TABLET BY MOUTH TWICE DAILY AS NEEDED FOR  ARTHRITIS  TAKE  AFTER  A  MEAL  . oxybutynin (DITROPAN) 5 MG tablet TAKE ONE TABLET BY MOUTH 4 TIMES DAILY AS NEEDED  . pioglitazone (ACTOS) 15 MG tablet Take 1 tablet (15 mg total) by mouth daily.   Facility-Administered Encounter Medications as of 06/20/2017  Medication  . 0.9 %   sodium chloride infusion    Activities of Daily Living No flowsheet data found.  Patient Care Team: Debbrah Alar, NP as PCP - General (Internal Medicine) Marybelle Killings, MD (Orthopedic Surgery) Stark Klein, MD as Consulting Physician (General Surgery) Magrinat, Virgie Dad, MD as Consulting Physician (Oncology) Kyung Rudd, MD as Consulting Physician (Radiation Oncology) Rockwell Germany, RN as Registered Nurse Mauro Kaufmann, RN as Registered Nurse Jake Shark Johny Blamer, NP as Nurse Practitioner (Hematology and Oncology)    Assessment:    Physical assessment deferred to PCP.  Exercise Activities and Dietary recommendations   Diet (meal preparation, eat out, water intake, caffeinated beverages, dairy products, fruits and vegetables): {Desc; diets:16563} Breakfast: Lunch:  Dinner:      Goals    None     Fall Risk Fall Risk  10/03/2016 06/08/2016 03/09/2016 08/18/2014  Falls in the past year? No No No Yes  Number falls in past yr: - - - 1  Injury with Fall? - - - No  Risk for fall due to : - - - History of fall(s)   Depression Screen PHQ 2/9 Scores 09/07/2016 06/08/2016 03/09/2016 08/18/2014  PHQ - 2 Score 0 1 0 0     Cognitive Function        Immunization History  Administered Date(s) Administered  . Influenza Split 08/03/2011, 09/13/2012  . Influenza Whole 08/12/2008, 08/12/2009, 08/19/2010  . Influenza, High Dose Seasonal PF 09/05/2013, 09/07/2016  . Influenza,inj,Quad PF,36+ Mos 08/18/2014, 08/07/2015  . Pneumococcal Conjugate-13 08/18/2014  . Pneumococcal Polysaccharide-23 09/10/2007  . Td 10/21/2008  . Zoster 02/04/2010   Screening Tests Health Maintenance  Topic Date Due  . MAMMOGRAM  08/31/2016  . OPHTHALMOLOGY EXAM  06/01/2017  . INFLUENZA VACCINE  06/14/2017  . FOOT EXAM  09/07/2017  . HEMOGLOBIN A1C  11/02/2017  . TETANUS/TDAP  10/21/2018  . DEXA SCAN  Completed  . PNA vac Low Risk Adult  Completed      Plan:   ***   I have  personally reviewed and noted the following in the patient's chart:   . Medical and social history . Use of alcohol, tobacco or illicit drugs  . Current medications and supplements . Functional ability and status . Nutritional status . Physical activity . Advanced directives . List of other physicians . Hospitalizations, surgeries, and ER visits in previous 12 months . Vitals . Screenings to include cognitive, depression, and falls . Referrals and appointments  In addition, I have reviewed and discussed with patient  certain preventive protocols, quality metrics, and best practice recommendations. A written personalized care plan for preventive services as well as general preventive health recommendations were provided to patient.     Naaman Plummer [place], [place]  06/13/2017

## 2017-06-19 ENCOUNTER — Other Ambulatory Visit: Payer: Self-pay | Admitting: Family

## 2017-06-19 NOTE — Telephone Encounter (Signed)
Rx sent to pharmacy. LB 

## 2017-06-20 ENCOUNTER — Ambulatory Visit: Payer: Medicare Other | Admitting: *Deleted

## 2017-07-25 ENCOUNTER — Other Ambulatory Visit: Payer: Self-pay | Admitting: Family

## 2017-08-14 ENCOUNTER — Other Ambulatory Visit: Payer: Self-pay | Admitting: Family

## 2017-08-14 NOTE — Telephone Encounter (Signed)
Rx approved and sent to the pharmacy by e-script.//AB/CMA 

## 2017-08-28 ENCOUNTER — Other Ambulatory Visit: Payer: Self-pay | Admitting: Family

## 2017-08-28 NOTE — Telephone Encounter (Signed)
Rx approved and sent to the pharmacy by e-script.//AB/CMA 

## 2017-09-06 ENCOUNTER — Encounter: Payer: Self-pay | Admitting: Family

## 2017-09-06 ENCOUNTER — Ambulatory Visit (INDEPENDENT_AMBULATORY_CARE_PROVIDER_SITE_OTHER): Payer: Medicare Other | Admitting: Family

## 2017-09-06 VITALS — BP 143/66 | HR 50 | Temp 98.4°F | Resp 16 | Ht 64.0 in | Wt 237.2 lb

## 2017-09-06 DIAGNOSIS — Z23 Encounter for immunization: Secondary | ICD-10-CM

## 2017-09-06 DIAGNOSIS — E119 Type 2 diabetes mellitus without complications: Secondary | ICD-10-CM

## 2017-09-06 DIAGNOSIS — I10 Essential (primary) hypertension: Secondary | ICD-10-CM | POA: Diagnosis not present

## 2017-09-06 DIAGNOSIS — E785 Hyperlipidemia, unspecified: Secondary | ICD-10-CM | POA: Diagnosis not present

## 2017-09-06 MED ORDER — ZOSTER VAC RECOMB ADJUVANTED 50 MCG/0.5ML IM SUSR: 0.5000 mL | Freq: Once | INTRAMUSCULAR | 1 refills | Status: AC

## 2017-09-06 NOTE — Patient Instructions (Signed)
Please complete lab work prior to leaving.   

## 2017-09-06 NOTE — Assessment & Plan Note (Signed)
Stable.  Obtain follow-up A1c.

## 2017-09-06 NOTE — Progress Notes (Signed)
Subjective:    Patient ID: Kelly Bell, female    DOB: [birth year], 76 y.o.   MRN: [id number]  HPI  Kelly Bell is a 76 yr old female who presents today for follow up.  1) HTN-she remains on hydrochlorothiazide 25 mg, and metoprolol XL 25 mg. BP Readings from Last 3 Encounters:  09/06/17 (!) 143/66  05/03/17 (!) 147/54  01/03/17 (!) 147/64   2) Hyperlipidemia-she is intolerant to statins but is maintained on Zetia 10 mg. Reports that she only takes 3 x a week due to worsening of her knee pain.   Lab Results  Component Value Date   CHOL 204 (H) 09/07/2016   HDL 40.60 09/07/2016   LDLCALC 133 (H) 09/07/2016   LDLDIRECT 111.5 01/01/2007   TRIG 152.0 (H) 09/07/2016   CHOLHDL 5 09/07/2016   3) DM2-diabetes is diet controlled. Reports that there home sugars run <150 at home.  Lab Results  Component Value Date   HGBA1C 6.1 05/03/2017   HGBA1C 5.9 01/03/2017   HGBA1C 5.7 09/07/2016   Lab Results  Component Value Date   MICROALBUR 0.7 08/07/2015   LDLCALC 133 (H) 09/07/2016   CREATININE 0.76 05/03/2017     Review of Systems    see HPI  Past Medical History:  Diagnosis Date  . Allergy   . Arthritis    knees  . Breast cancer ([place])    Right  . HTN (hypertension)   . Hyperlipidemia   . Hypertension   . Irritable bladder   . Knee pain    Bilateral  . SVD (spontaneous vaginal delivery)    x 2  . Type II or unspecified type diabetes mellitus without mention of complication, not stated as uncontrolled      Social History   Social History  . Marital status: Married    Spouse name: N/A  . Number of children: 2  . Years of education: 12   Occupational History  . 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 Smoking status: Never Smoker  . Smokeless tobacco: Never Used  . Alcohol use Yes     Comment: occasional wine  . Drug use: No  . Sexual activity: Not Currently    Birth control/ protection: Post-menopausal       Comment: Hysterectomy   Other Topics Concern  . Not on file   Social History Narrative   HSG. Married 1965. 1 son - '68, 1 daughter-'72; 3 grandsons. 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                Past Surgical History:  Procedure Laterality Date  . ABDOMINAL HYSTERECTOMY  1975  . COLONOSCOPY  05/10/05, 2011   hx polyps  . MASTECTOMY PARTIAL / LUMPECTOMY W/ AXILLARY LYMPHADENECTOMY  07/1986  . MASTECTOMY W/ SENTINEL NODE BIOPSY Left 10/28/2015   Procedure: LEFT MASTECTOMY WITH SENTINEL LYMPH NODE BIOPSY;  Surgeon: Stark Klein, MD;  Location: [facility];  Service: General;  Laterality: Left;  . SHOULDER ARTHROSCOPY  2000    Family History  Problem Relation Age of Onset  . Diabetes Mother 26       Deceased  . Pancreatic cancer Mother        dx. early 70s  . Diabetes Father 28       Deceased  . Heart failure Father   . Heart disease Father  CHF  . Breast cancer Sister 52  . Cancer Brother        oral cancer - metastasis to lungs; smoker  . Corneal Dystrophy Son        bilateral  . Healthy Daughter        x1  . Breast cancer Sister        dx. 80s  . Esophageal cancer Maternal Grandfather   . Rectal cancer Maternal Grandfather   . Stomach cancer Maternal Grandfather   . Cancer Cousin        oral cancer dx. <60; +EtOH, smoker; daughter of father's paternal half-brother    Allergies  Allergen Reactions  . Pravastatin Other (See Comments)    Severe muscle cramps.  . Lisinopril     cough    Current Outpatient Prescriptions on File Prior to Visit  Medication Sig Dispense Refill  . ACCU-CHEK AVIVA PLUS test strip USE TO CHECK BLOOD SUGAR ONCE DAILY 100 each 1  . aspirin 325 MG tablet Take 325 mg by mouth daily.      . Blood Glucose Monitoring Suppl (ACCU-CHEK AVIVA PLUS) w/Device KIT Use as instructed to check blood sugar once a day. DX  E11.9 1 kit 0  . Calcium Carbonate-Vitamin D (CALTRATE 600+D) 600-400  MG-UNIT per tablet Take 1 tablet by mouth 2 (two) times daily.    Marland Kitchen ezetimibe (ZETIA) 10 MG tablet TAKE ONE TABLET BY MOUTH ONCE DAILY 90 tablet 1  . fluticasone (FLONASE) 50 MCG/ACT nasal spray USE TWO SPRAY IN EACH NOSTRIL ONCE DAILY 48 g 1  . hydrochlorothiazide (HYDRODIURIL) 25 MG tablet TAKE ONE-HALF TABLET BY MOUTH ONCE DAILY 45 tablet 0  . losartan (COZAAR) 100 MG tablet TAKE 1 TABLET BY MOUTH ONCE DAILY 90 tablet 0  . metoprolol succinate (TOPROL-XL) 25 MG 24 hr tablet TAKE 1 TABLET BY MOUTH ONCE DAILY 90 tablet 1  . nabumetone (RELAFEN) 500 MG tablet TAKE ONE TABLET BY MOUTH TWICE DAILY AS NEEDED FOR  ARTHRITIS  TAKE  AFTER  A  MEAL 60 tablet 0  . oxybutynin (DITROPAN) 5 MG tablet TAKE ONE TABLET BY MOUTH 4 TIMES DAILY AS NEEDED 120 tablet 1  . pioglitazone (ACTOS) 15 MG tablet TAKE 1 TABLET BY MOUTH ONCE DAILY 90 tablet 1  . [DISCONTINUED] oxybutynin (DITROPAN) 5 MG tablet TAKE ONE TABLET BY MOUTH 4 TIMES DAILY AS NEEDED 120 tablet 5   Current Facility-Administered Medications on File Prior to Visit  Medication Dose Route Frequency Provider Last Rate Last Dose  . 0.9 %  sodium chloride infusion  500 mL Intravenous Continuous Irene Shipper, MD        BP (!) 143/66 (BP Location: Left Arm, Cuff Size: Large)   Pulse (!) 50   Temp 98.4 F (36.9 C) (Oral)   Resp 16   Ht '5\' 4"'$  (1.626 m)   Wt 237 lb 3.2 oz (107.6 kg)   SpO2 100%   BMI 40.72 kg/m    Objective:   Physical Exam  Constitutional: She is oriented to person, place, and time. She appears well-developed and well-nourished.  HENT:  Head: Normocephalic and atraumatic.  Cardiovascular: Normal rate, regular rhythm and normal heart sounds.   No murmur heard. Pulmonary/Chest: Effort normal and breath sounds normal. No respiratory distress. She has no wheezes.  Musculoskeletal: She exhibits no edema.  Neurological: She is alert and oriented to person, place, and time.  Psychiatric: She has a normal mood and affect. Her  behavior is normal. Judgment and  thought content normal.          Assessment & Plan:  Flu shot today.

## 2017-09-06 NOTE — Assessment & Plan Note (Signed)
Intolerant to statins.  She tolerates Zetia 3 days a week.  Obtain follow-up lipid panel.

## 2017-09-06 NOTE — Addendum Note (Signed)
Addended by: Harl Bowie on: 09/06/2017 12:18 PM   Modules accepted: Orders

## 2017-09-06 NOTE — Assessment & Plan Note (Signed)
Blood pressure is acceptable for her age. Continue current meds.

## 2017-09-08 ENCOUNTER — Other Ambulatory Visit: Payer: Medicare Other

## 2017-09-12 ENCOUNTER — Other Ambulatory Visit (INDEPENDENT_AMBULATORY_CARE_PROVIDER_SITE_OTHER): Payer: Medicare Other

## 2017-09-12 DIAGNOSIS — E119 Type 2 diabetes mellitus without complications: Secondary | ICD-10-CM | POA: Diagnosis not present

## 2017-09-12 DIAGNOSIS — E785 Hyperlipidemia, unspecified: Secondary | ICD-10-CM

## 2017-09-12 LAB — BASIC METABOLIC PANEL
BUN: 14 mg/dL (ref 6–23)
CALCIUM: 9.6 mg/dL (ref 8.4–10.5)
CO2: 27 meq/L (ref 19–32)
Chloride: 108 mEq/L (ref 96–112)
Creatinine, Ser: 0.8 mg/dL (ref 0.40–1.20)
GFR: 89.3 mL/min (ref >60.00)
GLUCOSE: 115 mg/dL — AB (ref 70–99)
POTASSIUM: 4.1 meq/L (ref 3.5–5.1)
Sodium: 141 mEq/L (ref 135–145)

## 2017-09-12 LAB — LIPID PANEL
Cholesterol: 163 mg/dL (ref 0–200)
HDL: 40.5 mg/dL (ref >39.00)
LDL CALC: 102 mg/dL — AB (ref 0–99)
NonHDL: 122.87
TRIGLYCERIDES: 105 mg/dL (ref 0.0–149.0)
Total CHOL/HDL Ratio: 4
VLDL: 21 mg/dL (ref 0.0–40.0)

## 2017-09-12 LAB — HEMOGLOBIN A1C: HEMOGLOBIN A1C: 5.9 % (ref 4.6–6.5)

## 2017-10-02 ENCOUNTER — Telehealth: Payer: Self-pay | Admitting: *Deleted

## 2017-10-02 ENCOUNTER — Ambulatory Visit: Payer: Medicare Other | Admitting: Family

## 2017-10-02 ENCOUNTER — Encounter: Payer: Self-pay | Admitting: Family

## 2017-10-02 VITALS — BP 136/63 | HR 58 | Temp 98.3°F | Resp 16 | Ht 64.0 in | Wt 237.0 lb

## 2017-10-02 DIAGNOSIS — L509 Urticaria, unspecified: Secondary | ICD-10-CM | POA: Diagnosis not present

## 2017-10-02 MED ORDER — PREDNISONE 10 MG PO TABS: ORAL_TABLET | ORAL | 0 refills | Status: DC

## 2017-10-02 MED FILL — predniSONE 10 MG TABS: 10 | 4 days supply | Qty: 16 | Fill #0

## 2017-10-02 NOTE — Patient Instructions (Addendum)
Add zyrtec 10mg  once daily for the next 1 week. Begin prednisone. Call if new worsening symptoms or if not improved in 2-3 days.  Go to ER if you develop tongue/lip swelling or difficulty breathing.

## 2017-10-02 NOTE — Telephone Encounter (Signed)
Prednisone rx sent to [facility] at today's OV and pt then requested to get at a different pharmacy. Rx cancelled at [facility].

## 2017-10-02 NOTE — Progress Notes (Signed)
Subjectiv we will initiate a short course of e:    Patient ID: Kelly Bell, female    DOB: 1941-04-11, 76 y.o.   MRN: [id number]  HPI Patient is a 76 year old female who presents today with chief complaint of rash on the back of her neck.  She reports that area is pruritic.  First noticed yesterday.  Has been applying Neosporin ointment without improvement.  She denies any new detergents or hair products.   Review of Systems    See HPI   Past Medical History:  Diagnosis Date  . Allergy   . Arthritis    knees  . Breast cancer ([place])    Right  . HTN (hypertension)   . Hyperlipidemia   . Hypertension   . Irritable bladder   . Knee pain    Bilateral  . SVD (spontaneous vaginal delivery)    x 2  . Type II or unspecified type diabetes mellitus without mention of complication, not stated as uncontrolled      Social History   Socioeconomic History  . Marital status: Married    Spouse name: Not on file  . Number of children: 2  . Years of education: 63  . Highest education level: Not on file  Social Needs  . Financial resource strain: Not on file  . Food insecurity - worry: Not on file  . Food insecurity - inability: Not on file  . Transportation needs - medical: Not on file  . Transportation needs - non-medical: Not on file  Occupational History  . 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 Smoking status: Never Smoker  . Smokeless tobacco: Never Used  Substance and Sexual Activity  . Alcohol use: Yes    Comment: occasional wine  . Drug use: No  . Sexual activity: Not Currently    Birth control/protection: Post-menopausal    Comment: Hysterectomy  Other Topics Concern  . Not on file  Social History Narrative   HSG. Married 1965. 1 son - '68, 1 daughter-'72; 3 grandsons. 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                Past Surgical History:  Procedure Laterality Date  . ABDOMINAL  HYSTERECTOMY  1975  . COLONOSCOPY  05/10/05, 2011   hx polyps  . LEFT MASTECTOMY WITH SENTINEL LYMPH NODE BIOPSY Left 10/28/2015   Performed by Stark Klein[facility] at [facility]  . MASTECTOMY PARTIAL / LUMPECTOMY W/ AXILLARY LYMPHADENECTOMY  07/1986  . SHOULDER ARTHROSCOPY  2000    Family History  Problem Relation Age of Onset  . Diabetes Mother 72       Deceased  . Pancreatic cancer Mother        dx. early 80s  . Diabetes Father 79       Deceased  . Heart failure Father   . Heart disease Father        CHF  . Breast cancer Sister 82  . Cancer Brother        oral cancer - metastasis to lungs; smoker  . Corneal Dystrophy Son        bilateral  . Healthy Daughter        x1  . Breast cancer Sister        dx. 1s  . Esophageal cancer Maternal Grandfather   . Rectal cancer Maternal Grandfather   . Stomach cancer Maternal Grandfather   .  Cancer Cousin        oral cancer dx. <60; +EtOH, smoker; daughter of father's paternal half-brother    Allergies  Allergen Reactions  . Pravastatin Other (See Comments)    Severe muscle cramps.  . Lisinopril     cough    Current Outpatient Medications on File Prior to Visit  Medication Sig Dispense Refill  . ACCU-CHEK AVIVA PLUS test strip USE TO CHECK BLOOD SUGAR ONCE DAILY 100 each 1  . aspirin 325 MG tablet Take 325 mg by mouth daily.      . Blood Glucose Monitoring Suppl (ACCU-CHEK AVIVA PLUS) w/Device KIT Use as instructed to check blood sugar once a day. DX  E11.9 1 kit 0  . Calcium Carbonate-Vitamin D (CALTRATE 600+D) 600-400 MG-UNIT per tablet Take 1 tablet by mouth 2 (two) times daily.    Marland Kitchen ezetimibe (ZETIA) 10 MG tablet TAKE ONE TABLET BY MOUTH ONCE DAILY 90 tablet 1  . fluticasone (FLONASE) 50 MCG/ACT nasal spray USE TWO SPRAY IN EACH NOSTRIL ONCE DAILY 48 g 1  . hydrochlorothiazide (HYDRODIURIL) 25 MG tablet TAKE ONE-HALF TABLET BY MOUTH ONCE DAILY 45 tablet 0  . losartan (COZAAR) 100 MG tablet TAKE 1 TABLET BY  MOUTH ONCE DAILY 90 tablet 0  . metoprolol succinate (TOPROL-XL) 25 MG 24 hr tablet TAKE 1 TABLET BY MOUTH ONCE DAILY 90 tablet 1  . nabumetone (RELAFEN) 500 MG tablet TAKE ONE TABLET BY MOUTH TWICE DAILY AS NEEDED FOR  ARTHRITIS  TAKE  AFTER  A  MEAL 60 tablet 0  . oxybutynin (DITROPAN) 5 MG tablet TAKE ONE TABLET BY MOUTH 4 TIMES DAILY AS NEEDED 120 tablet 1  . pioglitazone (ACTOS) 15 MG tablet TAKE 1 TABLET BY MOUTH ONCE DAILY 90 tablet 1  . [DISCONTINUED] oxybutynin (DITROPAN) 5 MG tablet TAKE ONE TABLET BY MOUTH 4 TIMES DAILY AS NEEDED 120 tablet 5   Current Facility-Administered Medications on File Prior to Visit  Medication Dose Route Frequency Provider Last Rate Last Dose  . 0.9 %  sodium chloride infusion  500 mL Intravenous Continuous Irene Shipper, MD        BP 136/63 (BP Location: Right Arm, Patient Position: Sitting, Cuff Size: Large)   Pulse (!) 58   Temp 98.3 F (36.8 C) (Oral)   Resp 16   Ht 5' 4"  (1.626 m)   Wt 237 lb (107.5 kg)   SpO2 99%   BMI 40.68 kg/m     Objective:   Physical Exam  Constitutional: She appears well-developed and well-nourished.  HENT:  No tongue or lip swelling is noted.  Cardiovascular: Normal rate, regular rhythm and normal heart sounds.  No murmur heard. Pulmonary/Chest: Effort normal and breath sounds normal. No respiratory distress. She has no wheezes.  Skin:  Patches of urticaria is noted on left posterior neck and right posterior neck  Psychiatric: She has a normal mood and affect. Her behavior is normal. Judgment and thought content normal.          Assessment & Plan:  Urticaria-new.  Will initiate a  short course of prednisone.  She is advised to check her sugars twice daily while on prednisone and to contact us should her sugars go above 300.  I also advised her to begin Zyrtec.  She understands to call 911 if she develops tongue or lip swelling or shortness of breath.  She is also advised to let us know if symptoms  are not  improved in the next 2-3 days.

## 2017-10-21 ENCOUNTER — Other Ambulatory Visit: Payer: Self-pay | Admitting: Family

## 2017-11-13 ENCOUNTER — Encounter: Payer: Self-pay | Admitting: Family

## 2017-11-15 NOTE — Telephone Encounter (Signed)
Could you please contact [facility] and see if either of those medications that she was dispensed have been recalled?  If her lot has not been recalled that we do not need to change.

## 2017-11-16 ENCOUNTER — Encounter: Payer: Self-pay | Admitting: Family

## 2017-11-17 NOTE — Telephone Encounter (Signed)
Patient called back about this, would you mind calling [facility] after 9 AM and checking this please?

## 2017-11-29 ENCOUNTER — Telehealth (INDEPENDENT_AMBULATORY_CARE_PROVIDER_SITE_OTHER): Payer: Self-pay | Admitting: *Deleted

## 2017-11-29 ENCOUNTER — Ambulatory Visit (INDEPENDENT_AMBULATORY_CARE_PROVIDER_SITE_OTHER): Payer: Medicare Other | Admitting: Surgery

## 2017-11-29 ENCOUNTER — Ambulatory Visit (HOSPITAL_COMMUNITY)
Admission: RE | Admit: 2017-11-29 | Discharge: 2017-11-29 | Disposition: A | Discharge status: Home / Self Care | Payer: Medicare Other | Source: Ambulatory Visit | Attending: Surgery | Admitting: Surgery

## 2017-11-29 ENCOUNTER — Encounter (INDEPENDENT_AMBULATORY_CARE_PROVIDER_SITE_OTHER): Payer: Self-pay | Admitting: Surgery

## 2017-11-29 ENCOUNTER — Ambulatory Visit (INDEPENDENT_AMBULATORY_CARE_PROVIDER_SITE_OTHER): Payer: Medicare Other

## 2017-11-29 VITALS — Ht 64.0 in | Wt 237.0 lb

## 2017-11-29 DIAGNOSIS — G8929 Other chronic pain: Secondary | ICD-10-CM | POA: Diagnosis not present

## 2017-11-29 DIAGNOSIS — M25561 Pain in right knee: Secondary | ICD-10-CM

## 2017-11-29 DIAGNOSIS — M79661 Pain in right lower leg: Secondary | ICD-10-CM

## 2017-11-29 DIAGNOSIS — M7989 Other specified soft tissue disorders: Secondary | ICD-10-CM | POA: Insufficient documentation

## 2017-11-29 MED ORDER — METHYLPREDNISOLONE ACETATE 40 MG/ML IJ SUSP
40.0000 mg | INTRAMUSCULAR | Status: AC | PRN
  Administered 2017-11-29: 40 mg via INTRA_ARTICULAR

## 2017-11-29 MED ORDER — LIDOCAINE HCL 1 % IJ SOLN
3.0000 mL | INTRAMUSCULAR | Status: AC | PRN
  Administered 2017-11-29: 3 mL

## 2017-11-29 MED ORDER — BUPIVACAINE HCL 0.25 % IJ SOLN
6.0000 mL | INTRAMUSCULAR | Status: AC | PRN
  Administered 2017-11-29: 6 mL via INTRA_ARTICULAR

## 2017-11-29 NOTE — Telephone Encounter (Signed)
Pt is scheduled at [facility] vas lab today at 430pm, pt is aware of appt

## 2017-11-29 NOTE — Progress Notes (Signed)
Office Visit Note   Patient: Kelly Bell           Date of Birth: December 04, 1940           MRN: [id number] Visit Date: 11/29/2017              Requested by: Debbrah Alar, NP [address] [place], [place] [address] PCP: Debbrah Alar, NP   Assessment & Plan: Visit Diagnoses:  1. Chronic pain of right knee   2. Right calf pain     Plan: Patient's right knee pain offered conservative treatment with injection. Patient sent right knee was prepped with Betadine and intra-articular Marcaine/Depo-Medrol injection was performed. After sitting for a couple minutes patient did report good relief with Marcaine in place. With her acute right calf Pain and swelling last several days I will schedule stat venous Doppler to rule out DVT. We will await callback report. We'll schedule return office visit couple weeks to recheck right knee. 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  Follow-Up Instructions: Return in about 2 weeks (around 12/13/2017) for with Pang Robers.   Orders:  Orders Placed This Encounter  Procedures  . Large Joint Inj: R knee  . XR Knee 1-2 Views Right   No orders of the defined types were placed in this encounter.     Procedures: Large Joint Inj: R knee on 11/29/2017 10:48 AM Indications: pain and joint swelling Details: 25 G 1.5 in needle, medial approach  Arthrogram: No  Medications: 3 mL lidocaine 1 %; 6 mL bupivacaine 0.25 %; 40 mg methylPREDNISolone acetate 40 MG/ML Consent was given by the patient. Patient was prepped and draped in the usual sterile fashion.       Clinical Data: No additional findings.   Subjective: Chief Complaint  Patient presents with  . Right Knee - Pain    HPI Patient in today with complaints of right knee pain 3 weeks. States that around onset she was going upstairs when she felt a sharp pain in her knee. Question some  catching but no true locking or feeling of instability. Has had knee swelling. Also admits to having pain in the popliteal aspect along with pain and swelling in the right calf. Denies chest pain soreness of breath. Review of Systems No current cardiac pulmonary GI GU issues  Objective: Vital Signs: Ht 5\' 4"  (1.626 m)   Wt 237 lb (107.5 kg)   BMI 40.68 kg/m   Physical Exam  Constitutional: She is oriented to person, place, and time. No distress.  HENT:  Head: Normocephalic and atraumatic.  Eyes: Pupils are equal, round, and reactive to light.  Pulmonary/Chest: No respiratory distress.  Musculoskeletal:  Right knee range of motion about 0-120 with discomfort. Does have some knee swelling without large effusion. Swelling and tenderness posterior knee. Question Baker's cyst. Right calf is moderate to markedly tender.  Neurological: She is alert and oriented to person, place, and time.    Ortho Exam  Specialty Comments:  No specialty comments available.  Imaging: No results found.   PMFS History: Patient Active Problem List   Diagnosis Date Noted  . Overactive bladder 03/09/2016  . Family history of breast cancer 09/22/2015  . Malignant neoplasm of upper-inner quadrant of left breast in female, estrogen receptor positive ([place]) 09/09/2015  . 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 Bradycardia 09/17/2014  . Edema 03/05/2013  . Obesity, Class II, BMI  35-39.9 06/23/2012  . Routine health maintenance 06/23/2012  . LOW BACK PAIN, ACUTE 04/22/2010  . ALLERGIC RHINITIS 08/12/2009  . Other specified disorders of bladder 06/03/2008  . KNEE PAIN, LEFT 06/03/2008  . 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 CHEST PAIN, ATYPICAL, HX OF 10/02/2007  . Diabetes type 2, controlled ([place]) 10/01/2007  . Hyperlipidemia 09/13/2007  . Essential hypertension 09/13/2007  . DEGENERATIVE JOINT DISEASE 09/13/2007  . BREAST CANCER, HX OF 09/13/2007   Past Medical History:  Diagnosis Date  . Allergy   . Arthritis      knees  . Breast cancer ([place])    Right  . HTN (hypertension)   . Hyperlipidemia   . Hypertension   . Irritable bladder   . Knee pain    Bilateral  . SVD (spontaneous vaginal delivery)    x 2  . Type II or unspecified type diabetes mellitus without mention of complication, not stated as uncontrolled     Family History  Problem Relation Age of Onset  . Diabetes Mother 69       Deceased  . Pancreatic cancer Mother        dx. early 24s  . Diabetes Father 60       Deceased  . Heart failure Father   . Heart disease Father        CHF  . Breast cancer Sister 63  . Cancer Brother        oral cancer - metastasis to lungs; smoker  . Corneal Dystrophy Son        bilateral  . Healthy Daughter        x1  . Breast cancer Sister        dx. 5s  . Esophageal cancer Maternal Grandfather   . Rectal cancer Maternal Grandfather   . Stomach cancer Maternal Grandfather   . Cancer Cousin        oral cancer dx. <60; +EtOH, smoker; daughter of father's paternal half-brother    Past Surgical History:  Procedure Laterality Date  . ABDOMINAL HYSTERECTOMY  1975  . COLONOSCOPY  05/10/05, 2011   hx polyps  . MASTECTOMY PARTIAL / LUMPECTOMY W/ AXILLARY LYMPHADENECTOMY  07/1986  . MASTECTOMY W/ SENTINEL NODE BIOPSY Left 10/28/2015   Procedure: LEFT MASTECTOMY WITH SENTINEL LYMPH NODE BIOPSY;  Surgeon: Stark Klein, MD;  Location: [facility];  Service: General;  Laterality: Left;  . SHOULDER ARTHROSCOPY  2000   Social History   Occupational History  . 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 Smoking status: Never Smoker  . Smokeless tobacco: Never Used  Substance and Sexual Activity  . Alcohol use: Yes    Comment: occasional wine  . Drug use: No  . Sexual activity: Not Currently    Birth control/protection: Post-menopausal    Comment: Hysterectomy

## 2017-11-29 NOTE — Progress Notes (Signed)
Preliminary results by tech - Right Lower Ext. Venous Duplex Completed. Negative for deep and superficial vein thrombosis. Results given to [facility] Oda Cogan, BS, RDMS, RVT

## 2017-12-22 ENCOUNTER — Other Ambulatory Visit: Payer: Self-pay | Admitting: Family

## 2018-01-09 ENCOUNTER — Ambulatory Visit: Payer: Medicare Other | Admitting: Family

## 2018-01-26 ENCOUNTER — Telehealth: Payer: Self-pay | Admitting: *Deleted

## 2018-01-26 MED ORDER — PIOGLITAZONE HCL 15 MG PO TABS: 15.0000 mg | ORAL_TABLET | Freq: Every day | ORAL | 0 refills | Status: DC

## 2018-01-26 NOTE — Telephone Encounter (Signed)
Received refill request from [facility] requesting Rx for next refill of pioglitazone for April. Pt cancelled appt with O'sullivan, NP on 01/09/18 and has scheduled with Brien Few at [facility] for 03/27/18. Looks like pt is transferring care?Marland Kitchen Rx sent.

## 2018-02-07 ENCOUNTER — Ambulatory Visit: Payer: Medicare Other | Admitting: Nurse Practitioner

## 2018-02-07 ENCOUNTER — Encounter: Payer: Self-pay | Admitting: Nurse Practitioner

## 2018-02-07 ENCOUNTER — Ambulatory Visit: Payer: Medicare Other | Admitting: Family

## 2018-02-07 VITALS — BP 134/82 | HR 54 | Temp 98.4°F | Resp 16 | Ht 64.0 in | Wt 232.0 lb

## 2018-02-07 DIAGNOSIS — I10 Essential (primary) hypertension: Secondary | ICD-10-CM | POA: Diagnosis not present

## 2018-02-07 DIAGNOSIS — L509 Urticaria, unspecified: Secondary | ICD-10-CM

## 2018-02-07 MED ORDER — OXYBUTYNIN CHLORIDE 5 MG PO TABS: ORAL_TABLET | ORAL | 1 refills | Status: DC

## 2018-02-07 NOTE — Progress Notes (Signed)
Name: Kelly Bell   MRN: [id number]    DOB: June 04, 1941   Date:02/07/2018       Progress Note  Subjective  Chief Complaint  Chief Complaint  Patient presents with  . Rash    has had a rash that has been going on on and off for a month, does having itching    HPI Ms Dell is coming in for evaluation of rash and wants to discuss her blood pressure medication.  Rash- She describes as itchy spots to left eye and left leg. This began about 1 month ago and has improved since onset. She says she was treated for a similar rash in November with prednisone and zyrtec and it resolved after about 4 days of treatment She denies any pain, oral swelling, difficulty breathing, sneezing, rhinorrhea. She says overall she feels well No else in the house has a rash and she does not have pets. She has not tried anything for the rash  Hypertension -maintained on metoprolol succinate 25 daily She tells me that she actually stopped her losartan and HCTZ some time ago, a few weeks if not a month or greater, after she was notified by pharmacy of recall She says she does not want to resume these medications and she has been checking her BP at home with normal readings. Denies headaches, vision changes, chest pain, shortness of breath.  BP Readings from Last 3 Encounters:  02/07/18 134/82  10/02/17 136/63  09/06/17 (!) 143/66    Patient Active Problem List   Diagnosis Date Noted  . Overactive bladder 03/09/2016  . Family history of breast cancer 09/22/2015  . Malignant neoplasm of upper-inner quadrant of left breast in female, estrogen receptor positive ([place]) 09/09/2015  . 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 Bradycardia 09/17/2014  . Edema 03/05/2013  . Obesity, Class II, BMI 35-39.9 06/23/2012  . Routine health maintenance 06/23/2012  . LOW BACK PAIN, ACUTE 04/22/2010  . ALLERGIC RHINITIS 08/12/2009  . Other specified disorders of bladder 06/03/2008  . KNEE PAIN, LEFT 06/03/2008  . Arthus phenomenon  10/26/2007  . CHEST PAIN, ATYPICAL, HX OF 10/02/2007  . Diabetes type 2, controlled ([place]) 10/01/2007  . Hyperlipidemia 09/13/2007  . Essential hypertension 09/13/2007  . DEGENERATIVE JOINT DISEASE 09/13/2007  . BREAST CANCER, HX OF 09/13/2007    Social History   Tobacco Use  . Smoking status: Never Smoker  . Smokeless tobacco: Never Used  Substance Use Topics  . Alcohol use: Yes    Comment: occasional wine     Current Outpatient Medications:  .  ACCU-CHEK AVIVA PLUS test strip, USE TO CHECK BLOOD SUGAR ONCE DAILY, Disp: 100 each, Rfl: 1 .  aspirin 325 MG tablet, Take 325 mg by mouth daily.  , Disp: , Rfl:  .  Blood Glucose Monitoring Suppl (ACCU-CHEK AVIVA PLUS) w/Device KIT, Use as instructed to check blood sugar once a day. DX  E11.9, Disp: 1 kit, Rfl: 0 .  Calcium Carbonate-Vitamin D (CALTRATE 600+D) 600-400 MG-UNIT per tablet, Take 1 tablet by mouth 2 (two) times daily., Disp: , Rfl:  .  ezetimibe (ZETIA) 10 MG tablet, TAKE ONE TABLET BY MOUTH ONCE DAILY, Disp: 90 tablet, Rfl: 1 .  fluticasone (FLONASE) 50 MCG/ACT nasal spray, USE TWO SPRAY IN EACH NOSTRIL ONCE DAILY, Disp: 48 g, Rfl: 1 .  metoprolol succinate (TOPROL-XL) 25 MG 24 hr tablet, TAKE 1 TABLET BY MOUTH ONCE DAILY, Disp: 90 tablet, Rfl: 1 .  nabumetone (RELAFEN) 500 MG tablet, TAKE ONE TABLET BY  MOUTH TWICE DAILY AS NEEDED FOR  ARTHRITIS  TAKE  AFTER  A  MEAL, Disp: 60 tablet, Rfl: 0 .  oxybutynin (DITROPAN) 5 MG tablet, TAKE ONE TABLET BY MOUTH 4 TIMES DAILY AS NEEDED, Disp: 120 tablet, Rfl: 1 .  pioglitazone (ACTOS) 15 MG tablet, Take 1 tablet (15 mg total) by mouth daily., Disp: 90 tablet, Rfl: 0 .  hydrochlorothiazide (HYDRODIURIL) 25 MG tablet, TAKE ONE-HALF TABLET BY MOUTH ONCE DAILY (Patient not taking: Reported on 02/07/2018), Disp: 45 tablet, Rfl: 0 .  losartan (COZAAR) 100 MG tablet, TAKE 1 TABLET BY MOUTH ONCE DAILY (Patient not taking: Reported on 02/07/2018), Disp: 90 tablet, Rfl: 1  Current  Facility-Administered Medications:  .  0.9 %  sodium chloride infusion, 500 mL, Intravenous, Continuous, Irene Shipper, MD  Allergies  Allergen Reactions  . Pravastatin Other (See Comments)    Severe muscle cramps.  . Lisinopril     cough    ROS  No other specific complaints in a complete review of systems (except as listed in HPI above).  Objective  Vitals:   02/07/18 1306  BP: 134/82  Pulse: (!) 54  Resp: 16  Temp: 98.4 F (36.9 C)  TempSrc: Oral  SpO2: 97%  Weight: 232 lb (105.2 kg)  Height: 5' 4"  (1.626 m)   HR stable  Body mass index is 39.82 kg/m.  Nursing Note and Vital Signs reviewed.  Physical Exam Constitutional: Patient appears well-developed and well-nourished. Obese No distress.  HEENT: head atraumatic, normocephalic, pupils equal and reactive to light,  neck supple without lymphadenopathy, oropharynx pink and moist without exudate or swelling Cardiovascular: Normal rate, regular rhythm, S1/S2 present. Pulmonary/Chest: Effort normal and breath sounds clear. No respiratory distress or retractions. Skin: Scattered erythematous plaques to left cheek and left lower leg without exudate Psychiatric: Patient has a normal mood and affect. behavior is normal. Judgment and thought content normal.  Assessment & Plan RTC in May for establish visit to F/U on chronic conditions and re-check BP  Urticaria She will resume zyrtec once daily for at least one week. Return precautions discussed and printed on AVS She will F/U in 2-3 days if no improvement or if symptoms worses May need to consider referral to dermatology or allergist if this persists

## 2018-02-07 NOTE — Patient Instructions (Addendum)
Please begin taking zyrtec 10mg  daily for your rash. Call if new worsening symptoms or if not improved in 2-3 days.  Go to ER if you develop tongue/lip swelling or difficulty breathing.   For your blood pressure, continue to monitor your home readings, let me know if you are getting readings over 140/90.  Please return for your appointment in may so we can follow up on your chronic conditions and make sure your blood pressure is stable.  It was nice to meet you. Thanks for letting me take care of you today :)   Hives Hives (urticaria) are itchy, red, swollen areas on your skin. Hives can show up on any part of your body, and they can vary in size. They can be as small as the tip of a pen or much larger. Hives often fade within 24 hours (acute hives). In other cases, new hives show up after old ones fade. This can continue for many days or weeks (chronic hives). Hives are caused by your body's reaction to an irritant or to something that you are allergic to (trigger). You can get hives right after being around a trigger or hours later. Hives do not spread from person to person (are not contagious). Hives may get worse if you scratch them, if you exercise, or if you have worries (emotional stress). Follow these instructions at home: Medicines  Take or apply over-the-counter and prescription medicines only as told by your doctor.  If you were prescribed an antibiotic medicine, use it as told by your doctor. Do not stop taking the antibiotic even if you start to feel better. Skin Care  Apply cool, wet cloths (cool compresses) to the itchy, red, swollen areas.  Do not scratch your skin. Do not rub your skin. General instructions  Do not take hot showers or baths. This can make itching worse.  Do not wear tight clothes.  Use sunscreen and wear clothing that covers your skin when you are outside.  Avoid any triggers that cause your hives. Keep a journal to help you keep track of what causes  your hives. Write down: ? What medicines you take. ? What you eat and drink. ? What products you use on your skin.  Keep all follow-up visits as told by your doctor. This is important. Contact a doctor if:  Your symptoms are not better with medicine.  Your joints are painful or swollen. Get help right away if:  You have a fever.  You have belly pain.  Your tongue or lips are swollen.  Your eyelids are swollen.  Your chest or throat feels tight.  You have trouble breathing or swallowing. These symptoms may be an emergency. Do not wait to see if the symptoms will go away. Get medical help right away. Call your local emergency services (911 in the [place] Do not drive yourself to the hospital. This information is not intended to replace advice given to you by your health care provider. Make sure you discuss any questions you have with your health care provider. Document Released: 08/09/2008 Document Revised: 04/07/2016 Document Reviewed: 08/19/2015 Elsevier Interactive Patient Education  2018 [employer].

## 2018-02-07 NOTE — Assessment & Plan Note (Signed)
BP appears stable off losartan and hctz at this time She will continue to monitor her BP readings at home Return in May for F/U or sooner for readings >140/90

## 2018-02-12 ENCOUNTER — Encounter: Payer: Self-pay | Admitting: Nurse Practitioner

## 2018-02-12 ENCOUNTER — Ambulatory Visit (HOSPITAL_COMMUNITY)
Admission: EM | Admit: 2018-02-12 | Discharge: 2018-02-12 | Disposition: A | Discharge status: Home / Self Care | Payer: Medicare Other | Attending: Family Medicine | Admitting: Family Medicine

## 2018-02-12 ENCOUNTER — Encounter (HOSPITAL_COMMUNITY): Payer: Self-pay

## 2018-02-12 ENCOUNTER — Other Ambulatory Visit: Payer: Self-pay

## 2018-02-12 DIAGNOSIS — L500 Allergic urticaria: Secondary | ICD-10-CM

## 2018-02-12 MED ORDER — PREDNISONE 20 MG PO TABS: ORAL_TABLET | ORAL | 0 refills | Status: DC

## 2018-02-12 NOTE — ED Triage Notes (Signed)
Pt presents with rash to right shoulder and arm states it has been on both sides of her face and healed up and recently started on her arm.

## 2018-02-12 NOTE — ED Provider Notes (Signed)
[place]   [id number] 02/12/18 Arrival Time: 1303   SUBJECTIVE:  Kelly Bell is a 77 y.o. female who presents to the urgent care with complaint of rash to right shoulder and arm states it has been on both sides of her face and healed up and recently started on her arm.  She had the same rash about a year ago treated with prednisone and Zyrtec and it resolved.  The rash started again last week and is quite itchy.  It involves the chest on the right and the neck.  No difficulty swallowing or breathing.   Past Medical History:  Diagnosis Date  . Allergy   . Arthritis    knees  . Breast cancer ([place])    Right  . HTN (hypertension)   . Hyperlipidemia   . Hypertension   . Irritable bladder   . Knee pain    Bilateral  . SVD (spontaneous vaginal delivery)    x 2  . Type II or unspecified type diabetes mellitus without mention of complication, not stated as uncontrolled    Family History  Problem Relation Age of Onset  . Diabetes Mother 15       Deceased  . Pancreatic cancer Mother        dx. early 24s  . Diabetes Father 59       Deceased  . Heart failure Father   . Heart disease Father        CHF  . Breast cancer Sister 13  . Cancer Brother        oral cancer - metastasis to lungs; smoker  . Corneal Dystrophy Son        bilateral  . Healthy Daughter        x1  . Breast cancer Sister        dx. 65s  . Esophageal cancer Maternal Grandfather   . Rectal cancer Maternal Grandfather   . Stomach cancer Maternal Grandfather   . Cancer Cousin        oral cancer dx. <60; +EtOH, smoker; daughter of father's paternal half-brother   Social History   Socioeconomic History  . Marital status: Married    Spouse name: Not on file  . Number of children: 2  . Years of education: 23  . Highest education level: Not on file  Occupational History  . 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 Financial resource strain:  Not on file  . Food insecurity:    Worry: Not on file    Inability: Not on file  . Transportation needs:    Medical: Not on file    Non-medical: Not on file  Tobacco Use  . Smoking status: Never Smoker  . Smokeless tobacco: Never Used  Substance and Sexual Activity  . Alcohol use: Yes    Comment: occasional wine  . Drug use: No  . Sexual activity: Not Currently    Birth control/protection: Post-menopausal    Comment: Hysterectomy  Lifestyle  . Physical activity:    Days per week: Not on file    Minutes per session: Not on file  . Stress: Not on file  Relationships  . Social connections:    Talks on phone: Not on file    Gets together: Not on file    Attends religious service: Not on file    Active member of club or organization: Not on file    Attends meetings of clubs or organizations:  Not on file    Relationship status: Not on file  . Intimate partner violence:    Fear of current or ex partner: Not on file    Emotionally abused: Not on file    Physically abused: Not on file    Forced sexual activity: Not on file  Other Topics Concern  . Not on file  Social History Narrative   HSG. Married 1965. 1 son - '68, 1 daughter-'72; 3 grandsons. 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               Current Facility-Administered Medications for the 02/12/18 encounter [facility] Encounter)  Medication  . 0.9 %  sodium chloride infusion   No outpatient medications have been marked as taking for the 02/12/18 encounter [facility] Encounter).   Allergies  Allergen Reactions  . Pravastatin Other (See Comments)    Severe muscle cramps.  . Lisinopril     cough      ROS: As per HPI, remainder of ROS negative.   OBJECTIVE:   Vitals:   02/12/18 1333  BP: (!) 167/87  Pulse: 62  Resp: 18  Temp: 98.8 F (37.1 C)  SpO2: 100%  Weight: 232 lb (105.2 kg)     General appearance: alert; no distress Eyes: PERRL; EOMI; conjunctiva normal HENT:  normocephalic; atraumatic; TMs normal, canal normal, external ears normal without trauma; nasal mucosa normal; oral mucosa normal Neck: supple Lungs: clear to auscultation bilaterally Back: no CVA tenderness Extremities: no cyanosis or edema; symmetrical with no gross deformities Skin: warm and dry; urticarial rash right scapula, clavicle, and neck. Neurologic: normal gait; grossly normal Psychological: alert and cooperative; normal mood and affect      Labs:  Results for orders placed or performed in visit on 09/12/17  Hemoglobin A1c  Result Value Ref Range   Hgb A1c MFr Bld 5.9 4.6 - 6.5 %  Lipid Profile  Result Value Ref Range   Cholesterol 163 0 - 200 mg/dL   Triglycerides 105.0 0.0 - 149.0 mg/dL   HDL 40.50 >39.00 mg/dL   VLDL 21.0 0.0 - 40.0 mg/dL   LDL Cholesterol 102 (H) 0 - 99 mg/dL   Total CHOL/HDL Ratio 4    NonHDL 433.29   Basic metabolic panel  Result Value Ref Range   Sodium 141 135 - 145 mEq/L   Potassium 4.1 3.5 - 5.1 mEq/L   Chloride 108 96 - 112 mEq/L   CO2 27 19 - 32 mEq/L   Glucose, Bld 115 (H) 70 - 99 mg/dL   BUN 14 6 - 23 mg/dL   Creatinine, Ser 0.80 0.40 - 1.20 mg/dL   Calcium 9.6 8.4 - 10.5 mg/dL   GFR 89.30 >60.00 mL/min    Labs Reviewed - No data to display  No results found.     ASSESSMENT & PLAN:  1. Allergic urticaria     Meds ordered this encounter  Medications  . predniSONE (DELTASONE) 20 MG tablet    Sig: Two daily with food    Dispense:  10 tablet    Refill:  0    Reviewed expectations re: course of current medical issues. Questions answered. Outlined signs and symptoms indicating need for more acute intervention. Patient verbalized understanding. After Visit Summary given.      Robyn Haber, MD 02/12/18 1344

## 2018-03-08 ENCOUNTER — Other Ambulatory Visit: Payer: Self-pay | Admitting: Family

## 2018-03-09 NOTE — Telephone Encounter (Signed)
Received refill request for metoprolol succinate (TOPROL-XL) 25 MG 24 hr tablet. Last office visit 10/02/17 and last refill 08/14/2017. Refill sent to pharmacy.

## 2018-03-27 ENCOUNTER — Ambulatory Visit: Payer: Medicare Other | Admitting: Nurse Practitioner

## 2018-03-27 ENCOUNTER — Other Ambulatory Visit (INDEPENDENT_AMBULATORY_CARE_PROVIDER_SITE_OTHER): Payer: Medicare Other

## 2018-03-27 ENCOUNTER — Encounter: Payer: Self-pay | Admitting: Nurse Practitioner

## 2018-03-27 VITALS — BP 132/88 | HR 71 | Temp 98.0°F | Resp 16 | Ht 64.0 in | Wt 251.0 lb

## 2018-03-27 DIAGNOSIS — D367 Benign neoplasm of other specified sites: Secondary | ICD-10-CM

## 2018-03-27 DIAGNOSIS — L509 Urticaria, unspecified: Secondary | ICD-10-CM

## 2018-03-27 DIAGNOSIS — E119 Type 2 diabetes mellitus without complications: Secondary | ICD-10-CM

## 2018-03-27 DIAGNOSIS — E785 Hyperlipidemia, unspecified: Secondary | ICD-10-CM

## 2018-03-27 DIAGNOSIS — I10 Essential (primary) hypertension: Secondary | ICD-10-CM

## 2018-03-27 DIAGNOSIS — N3281 Overactive bladder: Secondary | ICD-10-CM

## 2018-03-27 LAB — CBC
HEMATOCRIT: 43 % (ref 36.0–46.0)
HEMOGLOBIN: 14.5 g/dL (ref 12.0–15.0)
MCHC: 33.8 g/dL (ref 30.0–36.0)
MCV: 93.8 fl (ref 78.0–100.0)
PLATELETS: 222 10*3/uL (ref 150.0–400.0)
RBC: 4.58 Mil/uL (ref 3.87–5.11)
RDW: 13.6 % (ref 11.5–15.5)
WBC: 4.1 10*3/uL (ref 4.0–10.5)

## 2018-03-27 LAB — COMPREHENSIVE METABOLIC PANEL
ALBUMIN: 4.1 g/dL (ref 3.5–5.2)
ALT: 14 U/L (ref 0–35)
AST: 16 U/L (ref 0–37)
Alkaline Phosphatase: 58 U/L (ref 39–117)
BUN: 10 mg/dL (ref 6–23)
CALCIUM: 9.9 mg/dL (ref 8.4–10.5)
CHLORIDE: 107 meq/L (ref 96–112)
CO2: 27 meq/L (ref 19–32)
CREATININE: 0.74 mg/dL (ref 0.40–1.20)
GFR: 97.57 mL/min (ref >60.00)
Glucose, Bld: 115 mg/dL — ABNORMAL HIGH (ref 70–99)
POTASSIUM: 4.5 meq/L (ref 3.5–5.1)
Sodium: 141 mEq/L (ref 135–145)
Total Bilirubin: 0.6 mg/dL (ref 0.2–1.2)
Total Protein: 7.1 g/dL (ref 6.0–8.3)

## 2018-03-27 LAB — HEMOGLOBIN A1C: Hgb A1c MFr Bld: 5.9 % (ref 4.6–6.5)

## 2018-03-27 MED ORDER — RANITIDINE HCL 150 MG PO TABS: 150.0000 mg | ORAL_TABLET | Freq: Every day | ORAL | 1 refills | Status: DC

## 2018-03-27 NOTE — Progress Notes (Signed)
Name: Kelly Bell   MRN: [id number]    DOB: 23-Oct-1941   Date:03/27/2018       Progress Note  Subjective  Chief Complaint  Chief Complaint  Patient presents with  . Establish Care    Hives that occur on and off once a week, has been going on since november has not idea what keeps causing the hives    HPI Ms Starrett Is establishing care with me today, transfer here from another [facility] practice for convenience of location. She does not routinely follow with any specialty providers, released from oncology s/p Breast cancer 20 years ago considered to be generally cured. We will review her chronic medical conditions and discuss hives and a cyst  Hypertension -maintained on metoprolol succinate 25 daily. She was also on losartan HCTZ but stopped taking the medication due to recall about 3 months ago. Reports daily medication compliance with metoprolol without noted adverse medication effects. Reports she continues to have norrmal readings off losartan HCTZ when she checks BP at home, recent readings 130s/80s. Denies headaches, vision changes, chest pain, shortness of breath. Occasionally notices mild swelling of lower legs especially in hot weather, this is not new for her.  BP Readings from Last 3 Encounters:  03/27/18 132/88  02/12/18 (!) 167/87  02/07/18 134/82   Diabetes- maintained on actos 15 daily. She was on metfomrin in the past but stopped due to GI upset. Reports daily medication compliance to actos without noted adverse medication effects. Reports she checks her blood sugars every morning, either fasting or after breakfast with readings always 100-130s. Denies hypoglycemia, diaphoresis, polyuria, polydipsia, polyphagia. She is wanting to stop actos if possible, told by prior provider it could be considered due to good A1c readings  Lab Results  Component Value Date   HGBA1C 5.9 09/12/2017   Cholesterol- maintained on zetia due to statin intolerance in past. She admits she does  not take the zetia daily, takes only on M, W, F due to muscle aches occurring  if she takes it daily.  Lab Results  Component Value Date   CHOL 163 09/12/2017   HDL 40.50 09/12/2017   LDLCALC 102 (H) 09/12/2017   LDLDIRECT 111.5 01/01/2007   TRIG 105.0 09/12/2017   CHOLHDL 4 09/12/2017    OAB-maintained on oxybutynin 5 prn with good control of OAB Reports she tolerates oxybutynin daily with no noted adverse effects including dry mouth, constipation, dizziness. She denies fevers, abdominal pain, nausea, vomiting, urinary frequency, urinary Incontinence, dysuria, hematuria  Hives- She has been seen here by myself and another provider and once at [facility] for hives. She says she Continues to have flares of hives and itching all over her body randomly- face, back, shoulders She does not have any hives today She does not experience oral swelling or shortness of breath.  The hives do not seem to be getting worse over time She does notice sometimes feeling stressed before the hives begin. She has not noticed the hives are related to any food and has not used new detergents or other products that she can recall  Cyst- This is not an acute problem She has noticed a painless mass to left anteiror wrist for some time now She denies tenderness,  erythema, numbness, tingling  Patient Active Problem List   Diagnosis Date Noted  . Overactive bladder 03/09/2016  . Family history of breast cancer 09/22/2015  . Malignant neoplasm of upper-inner quadrant of left breast in female, estrogen receptor positive ([place]) 09/09/2015  .  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 Bradycardia 09/17/2014  . Edema 03/05/2013  . Obesity, Class II, BMI 35-39.9 06/23/2012  . Routine health maintenance 06/23/2012  . ALLERGIC RHINITIS 08/12/2009  . 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 Diabetes type 2, controlled ([place]) 10/01/2007  . Hyperlipidemia 09/13/2007  . Essential hypertension 09/13/2007  . DEGENERATIVE JOINT DISEASE 09/13/2007   . BREAST CANCER, HX OF 09/13/2007    Past Surgical History:  Procedure Laterality Date  . ABDOMINAL HYSTERECTOMY  1975  . COLONOSCOPY  05/10/05, 2011   hx polyps  . MASTECTOMY PARTIAL / LUMPECTOMY W/ AXILLARY LYMPHADENECTOMY  07/1986  . MASTECTOMY W/ SENTINEL NODE BIOPSY Left 10/28/2015   Procedure: LEFT MASTECTOMY WITH SENTINEL LYMPH NODE BIOPSY;  Surgeon: Stark Klein, MD;  Location: [place];  Service: General;  Laterality: Left;  . SHOULDER ARTHROSCOPY  2000    Family History  Problem Relation Age of Onset  . Diabetes Mother 52       Deceased  . Pancreatic cancer Mother        dx. early 34s  . Diabetes Father 30       Deceased  . Heart failure Father   . Heart disease Father        CHF  . Breast cancer Sister 44  . Cancer Brother        oral cancer - metastasis to lungs; smoker  . Corneal Dystrophy Son        bilateral  . Healthy Daughter        x1  . Breast cancer Sister        dx. 60s  . Esophageal cancer Maternal Grandfather   . Rectal cancer Maternal Grandfather   . Stomach cancer Maternal Grandfather   . Cancer Cousin        oral cancer dx. <60; +EtOH, smoker; daughter of father's paternal half-brother    Social History   Socioeconomic History  . Marital status: Married    Spouse name: Not on file  . Number of children: 2  . Years of education: 61  . Highest education level: Not on file  Occupational History  . 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 Financial resource strain: Not on file  . Food insecurity:    Worry: Not on file    Inability: Not on file  . Transportation needs:    Medical: Not on file    Non-medical: Not on file  Tobacco Use  . Smoking status: Never Smoker  . Smokeless tobacco: Never Used  Substance and Sexual Activity  . Alcohol use: Yes    Comment: occasional wine  . Drug use: No  . Sexual activity: Not Currently    Birth control/protection:  Post-menopausal    Comment: Hysterectomy  Lifestyle  . Physical activity:    Days per week: Not on file    Minutes per session: Not on file  . Stress: Not on file  Relationships  . Social connections:    Talks on phone: Not on file    Gets together: Not on file    Attends religious service: Not on file    Active member of club or organization: Not on file    Attends meetings of clubs or organizations: Not on file    Relationship status: Not on file  . Intimate partner violence:    Fear of current or ex partner: Not on file    Emotionally abused: Not on file  Physically abused: Not on file    Forced sexual activity: Not on file  Other Topics Concern  . Not on file  Social History Narrative   HSG. Married 1965. 1 son - '68, 1 daughter-'72; 3 grandsons. 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                 Current Outpatient Medications:  .  ACCU-CHEK AVIVA PLUS test strip, USE TO CHECK BLOOD SUGAR ONCE DAILY, Disp: 100 each, Rfl: 1 .  aspirin 325 MG tablet, Take 325 mg by mouth daily.  , Disp: , Rfl:  .  Blood Glucose Monitoring Suppl (ACCU-CHEK AVIVA PLUS) w/Device KIT, Use as instructed to check blood sugar once a day. DX  E11.9, Disp: 1 kit, Rfl: 0 .  Calcium Carbonate-Vitamin D (CALTRATE 600+D) 600-400 MG-UNIT per tablet, Take 1 tablet by mouth 2 (two) times daily., Disp: , Rfl:  .  ezetimibe (ZETIA) 10 MG tablet, TAKE ONE TABLET BY MOUTH ONCE DAILY, Disp: 90 tablet, Rfl: 1 .  fluticasone (FLONASE) 50 MCG/ACT nasal spray, USE TWO SPRAY IN EACH NOSTRIL ONCE DAILY, Disp: 48 g, Rfl: 1 .  metoprolol succinate (TOPROL-XL) 25 MG 24 hr tablet, TAKE 1 TABLET BY MOUTH ONCE DAILY, Disp: 90 tablet, Rfl: 1 .  nabumetone (RELAFEN) 500 MG tablet, TAKE ONE TABLET BY MOUTH TWICE DAILY AS NEEDED FOR  ARTHRITIS  TAKE  AFTER  A  MEAL, Disp: 60 tablet, Rfl: 0 .  oxybutynin (DITROPAN) 5 MG tablet, TAKE ONE TABLET BY MOUTH 4 TIMES DAILY AS NEEDED, Disp: 120 tablet, Rfl: 1 .   pioglitazone (ACTOS) 15 MG tablet, Take 1 tablet (15 mg total) by mouth daily., Disp: 90 tablet, Rfl: 0 .  ranitidine (ZANTAC) 150 MG tablet, Take 1 tablet (150 mg total) by mouth at bedtime., Disp: 30 tablet, Rfl: 1  Current Facility-Administered Medications:  .  0.9 %  sodium chloride infusion, 500 mL, Intravenous, Continuous, Irene Shipper, MD  Allergies  Allergen Reactions  . Metformin And Related Diarrhea  . Pravastatin Other (See Comments)    Severe muscle cramps.  . Lisinopril     cough     ROS See HPI  Objective  Vitals:   03/27/18 1019  BP: 132/88  Pulse: 71  Resp: 16  Temp: 98 F (36.7 C)  TempSrc: Oral  SpO2: 96%  Weight: 251 lb (113.9 kg)  Height: _0  (1.626 m)   Body mass index is 43.08 kg/m.  Physical Exam Vital signs reviewed. Constitutional: Patient appears well-developed and well-nourished. No distress.  HENT: Head: Normocephalic and atraumatic. Nose: Nose normal. Mouth/Throat: Oropharynx is clear and moist. No oropharyngeal exudate.  Eyes: Conjunctivae and EOM are normal. Pupils are equal, round, and reactive to light. No scleral icterus.  Neck: Normal range of motion. Neck supple. Cardiovascular: Normal rate, regular rhythm and normal heart sounds.  No murmur heard. Distal pulses intact. Pulmonary/Chest: Effort normal and breath sounds normal. No respiratory distress. Musculoskeletal: Normal range of motion, No gross deformities Neurological: She is alert and oriented to person, place, and time. Coordination, balance, strength, speech and gait are normal.  Skin: Skin is warm and dry. No rash noted. No erythema. Non-tender, Palpable, moveable subcutaneous cyst to left anterior wrist. Psychiatric: Patient has a normal mood and affect. behavior is normal. Judgment and thought content normal.  Diabetic Foot Exam - Simple   Simple Foot Form Visual Inspection No deformities, no ulcerations, no other skin breakdown bilaterally:  Yes  Sensation  Testing Intact to touch and monofilament testing bilaterally:  Yes Pulse Check Posterior Tibialis and Dorsalis pulse intact bilaterally:  Yes Comments     Assessment & Plan RTC in 6 months for CPE; F/U: DM Recommended AWV with [facility] management and return precautions discussed including when to call 911 discussed and information printed in AVS Recommended referral to allergist for further evaluation and management of recurrent hives, she is hesitant due to cost but we discussed that hives could eventually become worse and potentially life threatening and warrants further workup and close follow up Will start zantac daily and continue daily h1 antihistamine- dosing and side effects discussed F/U for new, worsening symptoms - Ambulatory referral to Allergy - ranitidine (ZANTAC) 150 MG tablet; Take 1 tablet (150 mg total) by mouth at bedtime.  Dispense: 30 tablet; Refill: 1  Dermoid cyst of upper extremity, left Appears benign and is not bothersome to her other than cosmetically Offered referral to surgery for further evaluation and management but she would prefer to wait She will let me know if she wants surgical referral in future or for new, worsening symptoms

## 2018-03-27 NOTE — Patient Instructions (Addendum)
If you have medicare related insurance (such as traditional Medicare, [employer], [employer], or similar), Please make an appointment at the scheduling desk with Sharee Pimple, the [employer], for your Wellness visit in this office, which is a benefit with your insurance.  Please head downstairs for lab work/x-rays. If any of your test results are critically abnormal, you will be contacted right away. Your results may be released to your MyChart for viewing before I am able to provide you with my response. I will contact you within a week about your test results and any recommendations for abnormalities.  I have placed a referral to allergist. Our office will call you to schedule this appointment. You should hear from our office in 7-10 days. Please start zantac 150mg  daily IN ADDITION to a daily Claritin or zyrtec to see if this helps your hives. If you ever experience oral swelling or difficulty breathing please call 911 right away.  Please return in about 6 months for annual physical, routine follow up  Hives Hives (urticaria) are itchy, red, swollen areas on your skin. Hives can show up on any part of your body, and they can vary in size. They can be as small as the tip of a pen or much larger. Hives often fade within 24 hours (acute hives). In other cases, new hives show up after old ones fade. This can continue for many days or weeks (chronic hives). Hives are caused by your body's reaction to an irritant or to something that you are allergic to (trigger). You can get hives right after being around a trigger or hours later. Hives do not spread from person to person (are not contagious). Hives may get worse if you scratch them, if you exercise, or if you have worries (emotional stress). Follow these instructions at home: Medicines  Take or apply over-the-counter and prescription medicines only as told by your doctor.  If you were prescribed an antibiotic medicine,  use it as told by your doctor. Do not stop taking the antibiotic even if you start to feel better. Skin Care  Apply cool, wet cloths (cool compresses) to the itchy, red, swollen areas.  Do not scratch your skin. Do not rub your skin. General instructions  Do not take hot showers or baths. This can make itching worse.  Do not wear tight clothes.  Use sunscreen and wear clothing that covers your skin when you are outside.  Avoid any triggers that cause your hives. Keep a journal to help you keep track of what causes your hives. Write down: ? What medicines you take. ? What you eat and drink. ? What products you use on your skin.  Keep all follow-up visits as told by your doctor. This is important. Contact a doctor if:  Your symptoms are not better with medicine.  Your joints are painful or swollen. Get help right away if:  You have a fever.  You have belly pain.  Your tongue or lips are swollen.  Your eyelids are swollen.  Your chest or throat feels tight.  You have trouble breathing or swallowing. These symptoms may be an emergency. Do not wait to see if the symptoms will go away. Get medical help right away. Call your local emergency services (911 in the [place] Do not drive yourself to the hospital. This information is not intended to replace advice given to you by your health care provider. Make sure you discuss any questions you have with your health care  provider. Document Released: 08/09/2008 Document Revised: 04/07/2016 Document Reviewed: 08/19/2015 Elsevier Interactive Patient Education  2018 [employer].

## 2018-04-02 NOTE — Assessment & Plan Note (Signed)
BP stable off losartan-HCTZ Continue metoprolol at current dosage Continue to monitor BP readings at home F/U in 6 months or sooner for BP readings over 140/90 - Comprehensive metabolic panel; Future - CBC; Future

## 2018-04-02 NOTE — Assessment & Plan Note (Signed)
Stable, continue oxybutynin F/U for new, worsening symptoms

## 2018-04-02 NOTE — Assessment & Plan Note (Signed)
Can not tolerate statin, Continue zetia 09/12/17 lipid panel stable - Comprehensive metabolic panel; Future

## 2018-04-02 NOTE — Assessment & Plan Note (Signed)
Continue actos for now, update A1c She is off ACE/ARB now, allergic to lisinopril and stopped losartan due to pharmacy recalls, would prefer not to restart Will follow up with further recommendations pending lab results - HM DIABETES FOOT EXAM- Future We discussed routine foot inspection at home, wearing supportive shoes, follow up for wounds or sores. - Hemoglobin A1c; Future - Comprehensive metabolic panel; Future

## 2018-04-10 ENCOUNTER — Encounter: Payer: Self-pay | Admitting: Allergy and Immunology

## 2018-04-25 ENCOUNTER — Other Ambulatory Visit: Payer: Self-pay | Admitting: Family

## 2018-04-30 ENCOUNTER — Other Ambulatory Visit: Payer: Self-pay | Admitting: Family

## 2018-05-30 ENCOUNTER — Other Ambulatory Visit: Payer: Self-pay | Admitting: Family

## 2018-06-14 ENCOUNTER — Other Ambulatory Visit: Payer: Self-pay | Admitting: Family

## 2018-06-15 ENCOUNTER — Other Ambulatory Visit: Payer: Self-pay | Admitting: Family

## 2018-07-28 ENCOUNTER — Other Ambulatory Visit: Payer: Self-pay | Admitting: Family

## 2018-07-30 ENCOUNTER — Encounter: Payer: Self-pay | Admitting: Nurse Practitioner

## 2018-07-31 ENCOUNTER — Other Ambulatory Visit: Payer: Self-pay | Admitting: *Deleted

## 2018-07-31 MED ORDER — EZETIMIBE 10 MG PO TABS: 10.0000 mg | ORAL_TABLET | Freq: Every day | ORAL | 1 refills | Status: DC

## 2018-08-01 ENCOUNTER — Other Ambulatory Visit: Payer: Self-pay | Admitting: Family

## 2018-08-11 ENCOUNTER — Other Ambulatory Visit: Payer: Self-pay | Admitting: Family

## 2018-08-13 ENCOUNTER — Other Ambulatory Visit: Payer: Self-pay | Admitting: Family

## 2018-08-13 ENCOUNTER — Encounter: Payer: Self-pay | Admitting: Nurse Practitioner

## 2018-08-13 MED ORDER — PIOGLITAZONE HCL 15 MG PO TABS: 15.0000 mg | ORAL_TABLET | Freq: Every day | ORAL | 0 refills | Status: DC

## 2018-09-02 ENCOUNTER — Other Ambulatory Visit: Payer: Self-pay | Admitting: Nurse Practitioner

## 2018-09-28 ENCOUNTER — Encounter: Payer: Self-pay | Admitting: Nurse Practitioner

## 2018-09-28 ENCOUNTER — Other Ambulatory Visit (INDEPENDENT_AMBULATORY_CARE_PROVIDER_SITE_OTHER): Payer: Medicare Other

## 2018-09-28 ENCOUNTER — Ambulatory Visit: Payer: Medicare Other

## 2018-09-28 ENCOUNTER — Ambulatory Visit (INDEPENDENT_AMBULATORY_CARE_PROVIDER_SITE_OTHER): Payer: Medicare Other | Admitting: Nurse Practitioner

## 2018-09-28 VITALS — BP 130/80 | HR 51 | Ht 64.0 in | Wt 228.0 lb

## 2018-09-28 DIAGNOSIS — I10 Essential (primary) hypertension: Secondary | ICD-10-CM | POA: Diagnosis not present

## 2018-09-28 DIAGNOSIS — E785 Hyperlipidemia, unspecified: Secondary | ICD-10-CM

## 2018-09-28 DIAGNOSIS — Z0000 Encounter for general adult medical examination without abnormal findings: Secondary | ICD-10-CM

## 2018-09-28 DIAGNOSIS — Z23 Encounter for immunization: Secondary | ICD-10-CM

## 2018-09-28 DIAGNOSIS — E119 Type 2 diabetes mellitus without complications: Secondary | ICD-10-CM | POA: Diagnosis not present

## 2018-09-28 DIAGNOSIS — N3281 Overactive bladder: Secondary | ICD-10-CM

## 2018-09-28 LAB — LIPID PANEL
CHOLESTEROL: 178 mg/dL (ref 0–200)
HDL: 45.5 mg/dL (ref >39.00)
LDL Cholesterol: 112 mg/dL — ABNORMAL HIGH (ref 0–99)
NonHDL: 132.49
TRIGLYCERIDES: 104 mg/dL (ref 0.0–149.0)
Total CHOL/HDL Ratio: 4
VLDL: 20.8 mg/dL (ref 0.0–40.0)

## 2018-09-28 LAB — MICROALBUMIN / CREATININE URINE RATIO
CREATININE, U: 92.9 mg/dL
MICROALB UR: 1.2 mg/dL (ref 0.0–1.9)
MICROALB/CREAT RATIO: 1.3 mg/g (ref 0.0–30.0)

## 2018-09-28 LAB — HEMOGLOBIN A1C: Hgb A1c MFr Bld: 5.7 % (ref 4.6–6.5)

## 2018-09-28 NOTE — Assessment & Plan Note (Signed)
Continue actos Update labs - Hemoglobin A1c; Future - Lipid panel; Future

## 2018-09-28 NOTE — Assessment & Plan Note (Signed)
Continue zetia Update labs- she is fasting - Hemoglobin A1c; Future - Lipid panel; Future

## 2018-09-28 NOTE — Progress Notes (Signed)
Kelly Bell is a 77 y.o. female with the following history as recorded in Epic[employer]:  Patient Active Problem List   Diagnosis Date Noted  . Overactive bladder 03/09/2016  . Family history of breast cancer 09/22/2015  . Malignant neoplasm of upper-inner quadrant of left breast in female, estrogen receptor positive ([place]) 09/09/2015  . 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 Bradycardia 09/17/2014  . Edema 03/05/2013  . Obesity, Class II, BMI 35-39.9 06/23/2012  . Routine health maintenance 06/23/2012  . ALLERGIC RHINITIS 08/12/2009  . 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 Diabetes type 2, controlled ([place]) 10/01/2007  . Hyperlipidemia 09/13/2007  . Essential hypertension 09/13/2007  . DEGENERATIVE JOINT DISEASE 09/13/2007  . BREAST CANCER, HX OF 09/13/2007    Current Outpatient Medications  Medication Sig Dispense Refill  . ACCU-CHEK AVIVA PLUS test strip USE TO CHECK BLOOD SUGAR ONCE DAILY 100 each 1  . aspirin 325 MG tablet Take 325 mg by mouth daily.      . Blood Glucose Monitoring Suppl (ACCU-CHEK AVIVA PLUS) w/Device KIT USE AS DIRECTED TO CHECK GLUCOSE 1 kit 0  . Calcium Carbonate-Vitamin D (CALTRATE 600+D) 600-400 MG-UNIT per tablet Take 1 tablet by mouth 2 (two) times daily.    Marland Kitchen ezetimibe (ZETIA) 10 MG tablet Take 1 tablet (10 mg total) by mouth daily. 90 tablet 1  . fluticasone (FLONASE) 50 MCG/ACT nasal spray USE TWO SPRAY IN EACH NOSTRIL ONCE DAILY 48 g 1  . metoprolol succinate (TOPROL-XL) 25 MG 24 hr tablet TAKE 1 TABLET BY MOUTH ONCE DAILY 90 tablet 1  . nabumetone (RELAFEN) 500 MG tablet TAKE 1 TABLET BY MOUTH TWICE DAILY AS NEEDED FOR ARTHRITIS AFTER A MEAL 60 tablet 0  . oxybutynin (DITROPAN) 5 MG tablet TAKE 1 TABLET BY MOUTH 4 TIMES DAILY AS NEEDED 120 tablet 1  . pioglitazone (ACTOS) 15 MG tablet TAKE 1 TABLET BY MOUTH ONCE DAILY **PATIENT NEEDS  OFFICE VISIT FOR REFILLS** 90 tablet 0  . ranitidine (ZANTAC) 150 MG tablet Take 1 tablet (150 mg total) by mouth at bedtime. 30 tablet 1    Current Facility-Administered Medications  Medication Dose Route Frequency Provider Last Rate Last Dose  . 0.9 %  sodium chloride infusion  500 mL Intravenous Continuous Irene Shipper, MD        Allergies: Metformin and related; Pravastatin; and Lisinopril  Past Medical History:  Diagnosis Date  . Allergy   . Arthritis    knees  . Breast cancer ([place])    Right  . HTN (hypertension)   . Hyperlipidemia   . Hypertension   . Irritable bladder   . Knee pain    Bilateral  . SVD (spontaneous vaginal delivery)    x 2  . Type II or unspecified type diabetes mellitus without mention of complication, not stated as uncontrolled     Past Surgical History:  Procedure Laterality Date  . ABDOMINAL HYSTERECTOMY  1975  . COLONOSCOPY  05/10/05, 2011   hx polyps  . MASTECTOMY PARTIAL / LUMPECTOMY W/ AXILLARY LYMPHADENECTOMY  07/1986  . MASTECTOMY W/ SENTINEL NODE BIOPSY Left 10/28/2015   Procedure: LEFT MASTECTOMY WITH SENTINEL LYMPH NODE BIOPSY;  Surgeon: Stark Klein, MD;  Location: [facility];  Service: General;  Laterality: Left;  . SHOULDER ARTHROSCOPY  2000    Family History  Problem Relation Age of Onset  . Diabetes Mother 28       Deceased  . Pancreatic cancer Mother        dx. early  42s  . Diabetes Father 35       Deceased  . Heart failure Father   . Heart disease Father        CHF  . Breast cancer Sister 43  . Cancer Brother        oral cancer - metastasis to lungs; smoker  . Corneal Dystrophy Son        bilateral  . Healthy Daughter        x1  . Breast cancer Sister        dx. 59s  . Esophageal cancer Maternal Grandfather   . Rectal cancer Maternal Grandfather   . Stomach cancer Maternal Grandfather   . Cancer Cousin        oral cancer dx. <60; +EtOH, smoker; daughter of father's paternal half-brother    Social History   Tobacco Use  . Smoking status: Never Smoker  . Smokeless tobacco: Never Used  Substance Use Topics  . Alcohol use: Yes     Comment: occasional wine     Subjective:  Kelly Bell is here today for CPE.  Last dental exam:  dentures Last vision exam: 2019 Mammogram- hx breast cancer 20 years ago, was told to return for mammograms only if she wanted, she declines this year, no concerns Lipids: lipid panel today DM: a1c today Vaccinations: flu vacc today Diet and exercise: no regular diet or exercise  Hypertension -maintained on metoprolol succinate 25 daily, reports daily medication compliance, however has not taken daily medications yet today  BP Readings from Last 3 Encounters:  09/28/18 130/80  03/27/18 132/88  02/12/18 (!) 167/87   Diabetes- maintained on actos 15 daily without noted adverse effects  Lab Results  Component Value Date   HGBA1C 5.7 09/28/2018   Cholesterol- maintained on zetia 10 due to statin intolerance in past. She says she does not take the zetia daily, takes only on M, W, F due to muscle aches occurring  if she takes it daily.  OAB-maintained on oxybutynin 5 prn with good control of OAB symptoms  Review of Systems  Constitutional: Negative for chills and fever.  HENT: Negative for hearing loss.   Eyes: Negative for blurred vision and double vision.  Respiratory: Negative for cough and shortness of breath.   Cardiovascular: Negative for chest pain.  Gastrointestinal: Negative for abdominal pain, constipation, diarrhea, nausea and vomiting.  Genitourinary: Negative for dysuria and hematuria.  Musculoskeletal: Negative for falls.  Skin: Negative for rash.  Neurological: Negative for loss of consciousness and weakness.  Endo/Heme/Allergies: Does not bruise/bleed easily.  Psychiatric/Behavioral: Negative for depression. The patient is not nervous/anxious.    Objective:  Vitals:   09/28/18 1100  BP: 130/80  Pulse: (!) 51  SpO2: 97%  Weight: 228 lb (103.4 kg)  Height: _0  (1.626 m)   HR stable, on metoprolol  General: Well developed, well nourished, in no acute distress   Skin : Warm and dry.  Head: Normocephalic and atraumatic  Eyes: Sclera and conjunctiva clear; pupils round and reactive to light; extraocular movements intact  Ears: External normal; canals clear; tympanic membranes normal  Oropharynx: Pink, supple. No suspicious lesions  Neck: Supple without thyromegaly, adenopathy  Lungs: Respirations unlabored; clear to auscultation bilaterally without wheeze, rales, rhonchi  CVS exam: normal rate, regular rhythm, normal S1, S2, no murmurs, rubs, clicks or gallops.  Abdomen: Soft; nontender; nondistended; no masses Musculoskeletal: No deformities; no active joint inflammation  Extremities: No edema, cyanosis, clubbing  Vessels: Symmetric bilaterally  Neurologic: Alert  and oriented; speech intact; face symmetrical; moves all extremities well; CNII-XII intact without focal deficit  Psychiatric: Normal mood and affect.  Assessment:  1. Routine health maintenance   2. Need for influenza vaccination   3. Essential hypertension   4. Controlled type 2 diabetes mellitus without complication, without long-term current use of insulin ([place])   5. Overactive bladder   6. Hyperlipidemia, unspecified hyperlipidemia type     Plan:   Return in about 6 months (around 03/29/2019) for routine F/U: Dm- a1c.  Orders Placed This Encounter  Procedures  . Flu vaccine HIGH DOSE PF  . Hemoglobin A1c    Standing Status:   Future    Number of Occurrences:   1    Standing Expiration Date:   09/29/2019  . Lipid panel    Standing Status:   Future    Number of Occurrences:   1    Standing Expiration Date:   09/29/2019  . Microalbumin / creatinine urine ratio    Standing Status:   Future    Number of Occurrences:   1    Standing Expiration Date:   09/29/2019    Requested Prescriptions    No prescriptions requested or ordered in this encounter    03/27/18-CBC, CMET WNL

## 2018-09-28 NOTE — Assessment & Plan Note (Signed)
Stable Continue current medication - Lipid panel; Future

## 2018-09-28 NOTE — Assessment & Plan Note (Signed)
Stable Continue current medications F/u for new, worsening symptoms

## 2018-09-28 NOTE — Patient Instructions (Signed)
Head downstairs for lab work today  Ill see you back in 6 months, sooner if needed!  Health Maintenance, Female Adopting a healthy lifestyle and getting preventive care can go a long way to promote health and wellness. Talk with your health care provider about what schedule of regular examinations is right for you. This is a good chance for you to check in with your provider about disease prevention and staying healthy. In between checkups, there are plenty of things you can do on your own. Experts have done a lot of research about which lifestyle changes and preventive measures are most likely to keep you healthy. Ask your health care provider for more information. Weight and diet Eat a healthy diet  Be sure to include plenty of vegetables, fruits, low-fat dairy products, and lean protein.  Do not eat a lot of foods high in solid fats, added sugars, or salt.  Get regular exercise. This is one of the most important things you can do for your health. ? Most adults should exercise for at least 150 minutes each week. The exercise should increase your heart rate and make you sweat (moderate-intensity exercise). ? Most adults should also do strengthening exercises at least twice a week. This is in addition to the moderate-intensity exercise.  Maintain a healthy weight  Body mass index (BMI) is a measurement that can be used to identify possible weight problems. It estimates body fat based on height and weight. Your health care provider can help determine your BMI and help you achieve or maintain a healthy weight.  For females 27 years of age and older: ? A BMI below 18.5 is considered underweight. ? A BMI of 18.5 to 24.9 is normal. ? A BMI of 25 to 29.9 is considered overweight. ? A BMI of 30 and above is considered obese.  Watch levels of cholesterol and blood lipids  You should start having your blood tested for lipids and cholesterol at 78 years of age, then have this test every 5  years.  You may need to have your cholesterol levels checked more often if: ? Your lipid or cholesterol levels are high. ? You are older than 78 years of age. ? You are at high risk for heart disease.  Cancer screening Lung Cancer  Lung cancer screening is recommended for adults 33-78 years old who are at high risk for lung cancer because of a history of smoking.  A yearly low-dose CT scan of the lungs is recommended for people who: ? Currently smoke. ? Have quit within the past 15 years. ? Have at least a 30-pack-year history of smoking. A pack year is smoking an average of one pack of cigarettes a day for 1 year.  Yearly screening should continue until it has been 15 years since you quit.  Yearly screening should stop if you develop a health problem that would prevent you from having lung cancer treatment.  Breast Cancer  Practice breast self-awareness. This means understanding how your breasts normally appear and feel.  It also means doing regular breast self-exams. Let your health care provider know about any changes, no matter how small.  If you are in your 78s or 30s, you should have a clinical breast exam (CBE) by a health care provider every 1-3 years as part of a regular health exam.  If you are 78 or older, have a CBE every year. Also consider having a breast X-ray (mammogram) every year.  If you have a family history of  breast cancer, talk to your health care provider about genetic screening.  If you are at high risk for breast cancer, talk to your health care provider about having an MRI and a mammogram every year.  Breast cancer gene (BRCA) assessment is recommended for women who have family members with BRCA-related cancers. BRCA-related cancers include: ? Breast. ? Ovarian. ? Tubal. ? Peritoneal cancers.  Results of the assessment will determine the need for genetic counseling and BRCA1 and BRCA2 testing.  Cervical Cancer Your health care provider may  recommend that you be screened regularly for cancer of the pelvic organs (ovaries, uterus, and vagina). This screening involves a pelvic examination, including checking for microscopic changes to the surface of your cervix (Pap test). You may be encouraged to have this screening done every 3 years, beginning at age 21.  For women ages 30-65, health care providers may recommend pelvic exams and Pap testing every 3 years, or they may recommend the Pap and pelvic exam, combined with testing for human papilloma virus (HPV), every 5 years. Some types of HPV increase your risk of cervical cancer. Testing for HPV may also be done on women of any age with unclear Pap test results.  Other health care providers may not recommend any screening for nonpregnant women who are considered low risk for pelvic cancer and who do not have symptoms. Ask your health care provider if a screening pelvic exam is right for you.  If you have had past treatment for cervical cancer or a condition that could lead to cancer, you need Pap tests and screening for cancer for at least 20 years after your treatment. If Pap tests have been discontinued, your risk factors (such as having a new sexual partner) need to be reassessed to determine if screening should resume. Some women have medical problems that increase the chance of getting cervical cancer. In these cases, your health care provider may recommend more frequent screening and Pap tests.  Colorectal Cancer  This type of cancer can be detected and often prevented.  Routine colorectal cancer screening usually begins at 78 years of age and continues through 78 years of age.  Your health care provider may recommend screening at an earlier age if you have risk factors for colon cancer.  Your health care provider may also recommend using home test kits to check for hidden blood in the stool.  A small camera at the end of a tube can be used to examine your colon directly  (sigmoidoscopy or colonoscopy). This is done to check for the earliest forms of colorectal cancer.  Routine screening usually begins at age 50.  Direct examination of the colon should be repeated every 5-10 years through 78 years of age. However, you may need to be screened more often if early forms of precancerous polyps or small growths are found.  Skin Cancer  Check your skin from head to toe regularly.  Tell your health care provider about any new moles or changes in moles, especially if there is a change in a mole's shape or color.  Also tell your health care provider if you have a mole that is larger than the size of a pencil eraser.  Always use sunscreen. Apply sunscreen liberally and repeatedly throughout the day.  Protect yourself by wearing long sleeves, pants, a wide-brimmed hat, and sunglasses whenever you are outside.  Heart disease, diabetes, and high blood pressure  High blood pressure causes heart disease and increases the risk of stroke. High blood   pressure is more likely to develop in: ? People who have blood pressure in the high end of the normal range (130-139/85-89 mm Hg). ? People who are overweight or obese. ? People who are African American.  If you are 60-67 years of age, have your blood pressure checked every 3-5 years. If you are 95 years of age or older, have your blood pressure checked every year. You should have your blood pressure measured twice-once when you are at a hospital or clinic, and once when you are not at a hospital or clinic. Record the average of the two measurements. To check your blood pressure when you are not at a hospital or clinic, you can use: ? An automated blood pressure machine at a pharmacy. ? A home blood pressure monitor.  If you are between 59 years and 32 years old, ask your health care provider if you should take aspirin to prevent strokes.  Have regular diabetes screenings. This involves taking a blood sample to check your  fasting blood sugar level. ? If you are at a normal weight and have a low risk for diabetes, have this test once every three years after 78 years of age. ? If you are overweight and have a high risk for diabetes, consider being tested at a younger age or more often. Preventing infection Hepatitis B  If you have a higher risk for hepatitis B, you should be screened for this virus. You are considered at high risk for hepatitis B if: ? You were born in a country where hepatitis B is common. Ask your health care provider which countries are considered high risk. ? Your parents were born in a high-risk country, and you have not been immunized against hepatitis B (hepatitis B vaccine). ? You have HIV or AIDS. ? You use needles to inject street drugs. ? You live with someone who has hepatitis B. ? You have had sex with someone who has hepatitis B. ? You get hemodialysis treatment. ? You take certain medicines for conditions, including cancer, organ transplantation, and autoimmune conditions.  Hepatitis C  Blood testing is recommended for: ? Everyone born from [birth year] through 1966. ? Anyone with known risk factors for hepatitis C.  Sexually transmitted infections (STIs)  You should be screened for sexually transmitted infections (STIs) including gonorrhea and chlamydia if: ? You are sexually active and are younger than 78 years of age. ? You are older than 78 years of age and your health care provider tells you that you are at risk for this type of infection. ? Your sexual activity has changed since you were last screened and you are at an increased risk for chlamydia or gonorrhea. Ask your health care provider if you are at risk.  If you do not have HIV, but are at risk, it may be recommended that you take a prescription medicine daily to prevent HIV infection. This is called pre-exposure prophylaxis (PrEP). You are considered at risk if: ? You are sexually active and do not regularly use condoms  or know the HIV status of your partner(s). ? You take drugs by injection. ? You are sexually active with a partner who has HIV.  Talk with your health care provider about whether you are at high risk of being infected with HIV. If you choose to begin PrEP, you should first be tested for HIV. You should then be tested every 3 months for as long as you are taking PrEP. Pregnancy  If you are premenopausal  and you may become pregnant, ask your health care provider about preconception counseling.  If you may become pregnant, take 400 to 800 micrograms (mcg) of folic acid every day.  If you want to prevent pregnancy, talk to your health care provider about birth control (contraception). Osteoporosis and menopause  Osteoporosis is a disease in which the bones lose minerals and strength with aging. This can result in serious bone fractures. Your risk for osteoporosis can be identified using a bone density scan.  If you are 83 years of age or older, or if you are at risk for osteoporosis and fractures, ask your health care provider if you should be screened.  Ask your health care provider whether you should take a calcium or vitamin D supplement to lower your risk for osteoporosis.  Menopause may have certain physical symptoms and risks.  Hormone replacement therapy may reduce some of these symptoms and risks. Talk to your health care provider about whether hormone replacement therapy is right for you. Follow these instructions at home:  Schedule regular health, dental, and eye exams.  Stay current with your immunizations.  Do not use any tobacco products including cigarettes, chewing tobacco, or electronic cigarettes.  If you are pregnant, do not drink alcohol.  If you are breastfeeding, limit how much and how often you drink alcohol.  Limit alcohol intake to no more than 1 drink per day for nonpregnant women. One drink equals 12 ounces of beer, 5 ounces of wine, or 1 ounces of hard  liquor.  Do not use street drugs.  Do not share needles.  Ask your health care provider for help if you need support or information about quitting drugs.  Tell your health care provider if you often feel depressed.  Tell your health care provider if you have ever been abused or do not feel safe at home. This information is not intended to replace advice given to you by your health care provider. Make sure you discuss any questions you have with your health care provider. Document Released: 05/16/2011 Document Revised: 04/07/2016 Document Reviewed: 08/04/2015 Elsevier Interactive Patient Education  [employer].

## 2018-09-28 NOTE — Assessment & Plan Note (Signed)
Reviewed annual screening exams, healthy lifestyle,  additional information provided on AVS - Hemoglobin A1c; Future - Lipid panel; Future - Microalbumin / creatinine urine ratio; Future  Need for influenza vaccination- Flu vaccine HIGH DOSE PF

## 2018-10-08 ENCOUNTER — Ambulatory Visit (INDEPENDENT_AMBULATORY_CARE_PROVIDER_SITE_OTHER): Payer: Medicare Other | Admitting: *Deleted

## 2018-10-08 VITALS — BP 133/84 | HR 57 | Resp 17 | Ht 64.0 in | Wt 229.0 lb

## 2018-10-08 DIAGNOSIS — Z0000 Encounter for general adult medical examination without abnormal findings: Secondary | ICD-10-CM | POA: Diagnosis not present

## 2018-10-08 MED ORDER — FLUTICASONE PROPIONATE 50 MCG/ACT NA SUSP: NASAL | 1 refills | Status: DC

## 2018-10-08 NOTE — Progress Notes (Signed)
Subjective:   Kelly Bell is a 77 y.o. female who presents for Medicare Annual (Subsequent) preventive examination.  Review of Systems:  No ROS.  Medicare Wellness Visit. Additional risk factors are reflected in the social history.  Cardiac Risk Factors include: advanced age (>45mn, >>82women);diabetes mellitus;dyslipidemia;hypertension;obesity (BMI >30kg/m2) Sleep patterns: gets up 1-2 times nightly to void and sleeps 5-6 hours nightly.  Patient reports insomnia issues, discussed recommended sleep tips and stress reduction tips, education was attached to patient's AVS.  Home Safety/Smoke Alarms: Feels safe in home. Smoke alarms in place.  Living environment; residence and Firearm Safety: 1-story house/ trailer, no firearms. Lives with husband, no needs for DME, good support system Seat Belt Safety/Bike Helmet: Wears seat belt.     Objective:     Vitals: BP 133/84   Pulse (!) 57   Resp 17   Ht _0  (1.626 m)   Wt 229 lb (103.9 kg)   SpO2 98%   BMI 39.31 kg/m   Body mass index is 39.31 kg/m.  Advanced Directives 10/08/2018 02/12/2018 06/14/2016 12/09/2015 12/02/2015 10/28/2015 10/22/2015  Does Patient Have a Medical Advance Directive? _1  No No  Does patient want to make changes to medical advance directive? Yes (ED - Information included in AVS) - - - - - -  Would patient like information on creating a medical advance directive? - - - No - patient declined information - No - patient declined information -    Tobacco Social History   Tobacco Use  Smoking Status Never Smoker  Smokeless Tobacco Never Used     Counseling given: Not Answered  Past Medical History:  Diagnosis Date  . Allergy   . Arthritis    knees  . Breast cancer ([facility]    Right  . HTN (hypertension)   . Hyperlipidemia   . Hypertension   . Irritable bladder   . Knee pain    Bilateral  . SVD (spontaneous vaginal delivery)    x 2  . Type II or unspecified type diabetes mellitus without  mention of complication, not stated as uncontrolled    Past Surgical History:  Procedure Laterality Date  . ABDOMINAL HYSTERECTOMY  1975  . COLONOSCOPY  05/10/05, 2011   hx polyps  . MASTECTOMY PARTIAL / LUMPECTOMY W/ AXILLARY LYMPHADENECTOMY  07/1986  . MASTECTOMY W/ SENTINEL NODE BIOPSY Left 10/28/2015   Procedure: LEFT MASTECTOMY WITH SENTINEL LYMPH NODE BIOPSY;  Surgeon: FStark Klein MD;  [place] [facility]  Service: General;  Laterality: Left;  . SHOULDER ARTHROSCOPY  2000   Family History  Problem Relation Age of Onset  . Diabetes Mother 799      Deceased  . Pancreatic cancer Mother        dx. early 763s . Diabetes Father 777      Deceased  . Heart failure Father   . Heart disease Father        CHF  . Breast cancer Sister 486 . Cancer Brother        oral cancer - metastasis to lungs; smoker  . Corneal Dystrophy Son        bilateral  . Healthy Daughter        x1  . Breast cancer Sister        dx. 69s . Esophageal cancer Maternal Grandfather   . Rectal cancer Maternal Grandfather   . Stomach cancer Maternal Grandfather   . Cancer Cousin  oral cancer dx. <60; +EtOH, smoker; daughter of father's paternal half-brother   Social History   Socioeconomic History  . Marital status: Married    Spouse name: Not on file  . Number of children: 2  . Years of education: 34  . Highest education level: Not on file  Occupational History  . 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 Financial resource strain: Not hard at all  . Food insecurity:    Worry: Never true    Inability: Never true  . Transportation needs:    Medical: No    Non-medical: No  Tobacco Use  . Smoking status: Never Smoker  . Smokeless tobacco: Never Used  Substance and Sexual Activity  . Alcohol use: Yes    Comment: occasional wine  . Drug use: No  . Sexual activity: Not Currently    Birth control/protection: Post-menopausal      Comment: Hysterectomy  Lifestyle  . Physical activity:    Days per week: 0 days    Minutes per session: 0 min  . Stress: Not at all  Relationships  . Social connections:    Talks on phone: More than three times a week    Gets together: More than three times a week    Attends religious service: More than 4 times per year    Active member of club or organization: Yes    Attends meetings of clubs or organizations: More than 4 times per year    Relationship status: Married  Other Topics Concern  . Not on file  Social History Narrative   HSG. Married 1965. 1 son - '68, 1 daughter-'72; 3 grandsons. 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                Outpatient Encounter Medications as of 10/08/2018  Medication Sig  . ACCU-CHEK AVIVA PLUS test strip USE TO CHECK BLOOD SUGAR ONCE DAILY  . aspirin 325 MG tablet Take 325 mg by mouth daily.    . Blood Glucose Monitoring Suppl (ACCU-CHEK AVIVA PLUS) w/Device KIT USE AS DIRECTED TO CHECK GLUCOSE  . Calcium Carbonate-Vitamin D (CALTRATE 600+D) 600-400 MG-UNIT per tablet Take 1 tablet by mouth 2 (two) times daily.  Marland Kitchen ezetimibe (ZETIA) 10 MG tablet Take 1 tablet (10 mg total) by mouth daily. (Patient taking differently: Take 10 mg by mouth every other day. )  . fluticasone (FLONASE) 50 MCG/ACT nasal spray USE TWO SPRAY IN EACH NOSTRIL ONCE DAILY  . metoprolol succinate (TOPROL-XL) 25 MG 24 hr tablet TAKE 1 TABLET BY MOUTH ONCE DAILY  . oxybutynin (DITROPAN) 5 MG tablet TAKE 1 TABLET BY MOUTH 4 TIMES DAILY AS NEEDED  . pioglitazone (ACTOS) 15 MG tablet TAKE 1 TABLET BY MOUTH ONCE DAILY **PATIENT NEEDS  OFFICE VISIT FOR REFILLS**  . [DISCONTINUED] fluticasone (FLONASE) 50 MCG/ACT nasal spray USE TWO SPRAY IN EACH NOSTRIL ONCE DAILY  . nabumetone (RELAFEN) 500 MG tablet TAKE 1 TABLET BY MOUTH TWICE DAILY AS NEEDED FOR ARTHRITIS AFTER A MEAL (Patient not taking: Reported on 10/08/2018)  . [DISCONTINUED] oxybutynin (DITROPAN)  5 MG tablet TAKE ONE TABLET BY MOUTH 4 TIMES DAILY AS NEEDED  . [DISCONTINUED] ranitidine (ZANTAC) 150 MG tablet Take 1 tablet (150 mg total) by mouth at bedtime. (Patient not taking: Reported on 10/08/2018)   Facility-Administered Encounter Medications as of 10/08/2018  Medication  . 0.9 %  sodium chloride infusion    Activities of Daily  Living In your present state of health, do you have any difficulty performing the following activities: 10/08/2018  Hearing? N  Vision? N  Difficulty concentrating or making decisions? N  Walking or climbing stairs? N  Dressing or bathing? N  Doing errands, shopping? N  Preparing Food and eating ? N  Using the Toilet? N  In the past six months, have you accidently leaked urine? N  Do you have problems with loss of bowel control? N  Managing your Medications? N  Managing your Finances? N  Housekeeping or managing your Housekeeping? N  Some recent data might be hidden    Patient Care Team: Lance Sell, NP as PCP - General (Nurse Practitioner) Marybelle Killings, MD (Orthopedic Surgery) Stark Klein, MD as Consulting Physician (General Surgery) Magrinat, Virgie Dad, MD as Consulting Physician (Oncology) Kyung Rudd, MD as Consulting Physician (Radiation Oncology) Rockwell Germany, RN as Registered Nurse Mauro Kaufmann, RN as Registered Nurse Jake Shark Johny Blamer, NP as Nurse Practitioner (Hematology and Oncology)    Assessment:   This is a routine wellness examination for Shawnese. Physical assessment deferred to PCP.   Exercise Activities and Dietary recommendations Current Exercise Habits: Home exercise routine, Type of exercise: walking;stretching, Time (Minutes): 30, Frequency (Times/Week): 3, Weekly Exercise (Minutes/Week): 90, Intensity: Mild, Exercise limited by: orthopedic condition(s)  Diet (meal preparation, eat out, water intake, caffeinated beverages, dairy products, fruits and vegetables): in general, a "healthy" diet  , well  balanced   Reviewed heart healthy and diabetic diet. Encouraged patient to increase daily water and healthy fluid intake.  Goals    . Patient Stated     Get my house cleaned and go on a trip to a warm place for the winter.       Fall Risk Fall Risk  10/08/2018 09/28/2018 10/03/2016 06/08/2016 03/09/2016  Falls in the past year? 0 0 No No No  Number falls in past yr: - 0 - - -  Injury with Fall? - 0 - - -  Risk for fall due to : - - - - -    Depression Screen PHQ 2/9 Scores 10/08/2018 09/28/2018 09/07/2016 06/08/2016  PHQ - 2 Score 1 0 0 1  PHQ- 9 Score 4 - - -     Cognitive Function MMSE - Mini Mental State Exam 10/08/2018  Orientation to time 5  Orientation to Place 5  Registration 3  Attention/ Calculation 5  Recall 1  Language- name 2 objects 2  Language- repeat 1  Language- follow 3 step command 3  Language- read & follow direction 1  Write a sentence 1  Copy design 1  Total score 28        Immunization History  Administered Date(s) Administered  . Influenza Split 08/03/2011, 09/13/2012  . Influenza Whole 08/12/2008, 08/12/2009, 08/19/2010  . Influenza, High Dose Seasonal PF 09/05/2013, 09/07/2016, 09/06/2017, 09/28/2018  . Influenza,inj,Quad PF,6+ Mos 08/18/2014, 08/07/2015  . Pneumococcal Conjugate-13 08/18/2014  . Pneumococcal Polysaccharide-23 09/10/2007  . Td 10/21/2008  . Zoster 02/04/2010   Screening Tests Health Maintenance  Topic Date Due  . TETANUS/TDAP  10/21/2018  . OPHTHALMOLOGY EXAM  11/14/2018  . FOOT EXAM  03/28/2019  . HEMOGLOBIN A1C  03/29/2019  . URINE MICROALBUMIN  09/29/2019  . INFLUENZA VACCINE  Completed  . DEXA SCAN  Completed  . PNA vac Low Risk Adult  Completed      Plan:      Continue doing brain stimulating activities (puzzles, reading, adult coloring books,  staying active) to keep memory sharp.   Continue to eat heart healthy diet (full of fruits, vegetables, whole grains, lean protein, water--limit salt, fat, and  sugar intake) and increase physical activity as tolerated.   I have personally reviewed and noted the following in the patient's chart:   . Medical and social history . Use of alcohol, tobacco or illicit drugs  . Current medications and supplements . Functional ability and status . Nutritional status . Physical activity . Advanced directives . List of other physicians . Vitals . Screenings to include cognitive, depression, and falls . Referrals and appointments  In addition, I have reviewed and discussed with patient certain preventive protocols, quality metrics, and best practice recommendations. A written personalized care plan for preventive services as well as general preventive health recommendations were provided to patient.     Michiel Cowboy, RN  10/08/2018

## 2018-10-08 NOTE — Progress Notes (Signed)
Medical screening examination/treatment/procedure(s) were performed by the Wellness Coach, RN. As primary care provider I was immediately available for consulation/collaboration. I agree with above documentation. Byrl Latin, NP  

## 2018-10-08 NOTE — Patient Instructions (Addendum)
Continue doing brain stimulating activities (puzzles, reading, adult coloring books, staying active) to keep memory sharp.   Continue to eat heart healthy diet (full of fruits, vegetables, whole grains, lean protein, water--limit salt, fat, and sugar intake) and increase physical activity as tolerated.   Kelly Bell , Thank you for taking time to come for your Medicare Wellness Visit. I appreciate your ongoing commitment to your health goals. Please review the following plan we discussed and let me know if I can assist you in the future.   These are the goals we discussed: Goals    . Patient Stated     Get my house cleaned and go on a trip to a warm place for the winter.       This is a list of the screening recommended for you and due dates:  Health Maintenance  Topic Date Due  . Tetanus Vaccine  10/21/2018  . Eye exam for diabetics  11/14/2018  . Complete foot exam   03/28/2019  . Hemoglobin A1C  03/29/2019  . Urine Protein Check  09/29/2019  . Flu Shot  Completed  . DEXA scan (bone density measurement)  Completed  . Pneumonia vaccines  Completed   Health Maintenance, Female Adopting a healthy lifestyle and getting preventive care can go a long way to promote health and wellness. Talk with your health care provider about what schedule of regular examinations is right for you. This is a good chance for you to check in with your provider about disease prevention and staying healthy. In between checkups, there are plenty of things you can do on your own. Experts have done a lot of research about which lifestyle changes and preventive measures are most likely to keep you healthy. Ask your health care provider for more information. Weight and diet Eat a healthy diet  Be sure to include plenty of vegetables, fruits, low-fat dairy products, and lean protein.  Do not eat a lot of foods high in solid fats, added sugars, or salt.  Get regular exercise. This is one of the most important  things you can do for your health. ? Most adults should exercise for at least 150 minutes each week. The exercise should increase your heart rate and make you sweat (moderate-intensity exercise). ? Most adults should also do strengthening exercises at least twice a week. This is in addition to the moderate-intensity exercise.  Maintain a healthy weight  Body mass index (BMI) is a measurement that can be used to identify possible weight problems. It estimates body fat based on height and weight. Your health care provider can help determine your BMI and help you achieve or maintain a healthy weight.  For females 26 years of age and older: ? A BMI below 18.5 is considered underweight. ? A BMI of 18.5 to 24.9 is normal. ? A BMI of 25 to 29.9 is considered overweight. ? A BMI of 30 and above is considered obese.  Watch levels of cholesterol and blood lipids  You should start having your blood tested for lipids and cholesterol at 78 years of age, then have this test every 5 years.  You may need to have your cholesterol levels checked more often if: ? Your lipid or cholesterol levels are high. ? You are older than 78 years of age. ? You are at high risk for heart disease.  Cancer screening Lung Cancer  Lung cancer screening is recommended for adults 62-83 years old who are at high risk for lung cancer  because of a history of smoking.  A yearly low-dose CT scan of the lungs is recommended for people who: ? Currently smoke. ? Have quit within the past 15 years. ? Have at least a 30-pack-year history of smoking. A pack year is smoking an average of one pack of cigarettes a day for 1 year.  Yearly screening should continue until it has been 15 years since you quit.  Yearly screening should stop if you develop a health problem that would prevent you from having lung cancer treatment.  Breast Cancer  Practice breast self-awareness. This means understanding how your breasts normally appear  and feel.  It also means doing regular breast self-exams. Let your health care provider know about any changes, no matter how small.  If you are in your 20s or 30s, you should have a clinical breast exam (CBE) by a health care provider every 1-3 years as part of a regular health exam.  If you are 31 or older, have a CBE every year. Also consider having a breast X-ray (mammogram) every year.  If you have a family history of breast cancer, talk to your health care provider about genetic screening.  If you are at high risk for breast cancer, talk to your health care provider about having an MRI and a mammogram every year.  Breast cancer gene (BRCA) assessment is recommended for women who have family members with BRCA-related cancers. BRCA-related cancers include: ? Breast. ? Ovarian. ? Tubal. ? Peritoneal cancers.  Results of the assessment will determine the need for genetic counseling and BRCA1 and BRCA2 testing.  Cervical Cancer Your health care provider may recommend that you be screened regularly for cancer of the pelvic organs (ovaries, uterus, and vagina). This screening involves a pelvic examination, including checking for microscopic changes to the surface of your cervix (Pap test). You may be encouraged to have this screening done every 3 years, beginning at age 14.  For women ages 48-65, health care providers may recommend pelvic exams and Pap testing every 3 years, or they may recommend the Pap and pelvic exam, combined with testing for human papilloma virus (HPV), every 5 years. Some types of HPV increase your risk of cervical cancer. Testing for HPV may also be done on women of any age with unclear Pap test results.  Other health care providers may not recommend any screening for nonpregnant women who are considered low risk for pelvic cancer and who do not have symptoms. Ask your health care provider if a screening pelvic exam is right for you.  If you have had past treatment  for cervical cancer or a condition that could lead to cancer, you need Pap tests and screening for cancer for at least 20 years after your treatment. If Pap tests have been discontinued, your risk factors (such as having a new sexual partner) need to be reassessed to determine if screening should resume. Some women have medical problems that increase the chance of getting cervical cancer. In these cases, your health care provider may recommend more frequent screening and Pap tests.  Colorectal Cancer  This type of cancer can be detected and often prevented.  Routine colorectal cancer screening usually begins at 78 years of age and continues through 78 years of age.  Your health care provider may recommend screening at an earlier age if you have risk factors for colon cancer.  Your health care provider may also recommend using home test kits to check for hidden blood in the stool.  A small camera at the end of a tube can be used to examine your colon directly (sigmoidoscopy or colonoscopy). This is done to check for the earliest forms of colorectal cancer.  Routine screening usually begins at age 51.  Direct examination of the colon should be repeated every 5-10 years through 78 years of age. However, you may need to be screened more often if early forms of precancerous polyps or small growths are found.  Skin Cancer  Check your skin from head to toe regularly.  Tell your health care provider about any new moles or changes in moles, especially if there is a change in a mole's shape or color.  Also tell your health care provider if you have a mole that is larger than the size of a pencil eraser.  Always use sunscreen. Apply sunscreen liberally and repeatedly throughout the day.  Protect yourself by wearing long sleeves, pants, a wide-brimmed hat, and sunglasses whenever you are outside.  Heart disease, diabetes, and high blood pressure  High blood pressure causes heart disease and  increases the risk of stroke. High blood pressure is more likely to develop in: ? People who have blood pressure in the high end of the normal range (130-139/85-89 mm Hg). ? People who are overweight or obese. ? People who are African American.  If you are 73-49 years of age, have your blood pressure checked every 3-5 years. If you are 59 years of age or older, have your blood pressure checked every year. You should have your blood pressure measured twice-once when you are at a hospital or clinic, and once when you are not at a hospital or clinic. Record the average of the two measurements. To check your blood pressure when you are not at a hospital or clinic, you can use: ? An automated blood pressure machine at a pharmacy. ? A home blood pressure monitor.  If you are between 56 years and 18 years old, ask your health care provider if you should take aspirin to prevent strokes.  Have regular diabetes screenings. This involves taking a blood sample to check your fasting blood sugar level. ? If you are at a normal weight and have a low risk for diabetes, have this test once every three years after 78 years of age. ? If you are overweight and have a high risk for diabetes, consider being tested at a younger age or more often. Preventing infection Hepatitis B  If you have a higher risk for hepatitis B, you should be screened for this virus. You are considered at high risk for hepatitis B if: ? You were born in a country where hepatitis B is common. Ask your health care provider which countries are considered high risk. ? Your parents were born in a high-risk country, and you have not been immunized against hepatitis B (hepatitis B vaccine). ? You have HIV or AIDS. ? You use needles to inject street drugs. ? You live with someone who has hepatitis B. ? You have had sex with someone who has hepatitis B. ? You get hemodialysis treatment. ? You take certain medicines for conditions, including  cancer, organ transplantation, and autoimmune conditions.  Hepatitis C  Blood testing is recommended for: ? Everyone born from [birth year] through 1966. ? Anyone with known risk factors for hepatitis C.  Sexually transmitted infections (STIs)  You should be screened for sexually transmitted infections (STIs) including gonorrhea and chlamydia if: ? You are sexually active and are younger than 78  years of age. ? You are older than 78 years of age and your health care provider tells you that you are at risk for this type of infection. ? Your sexual activity has changed since you were last screened and you are at an increased risk for chlamydia or gonorrhea. Ask your health care provider if you are at risk.  If you do not have HIV, but are at risk, it may be recommended that you take a prescription medicine daily to prevent HIV infection. This is called pre-exposure prophylaxis (PrEP). You are considered at risk if: ? You are sexually active and do not regularly use condoms or know the HIV status of your partner(s). ? You take drugs by injection. ? You are sexually active with a partner who has HIV.  Talk with your health care provider about whether you are at high risk of being infected with HIV. If you choose to begin PrEP, you should first be tested for HIV. You should then be tested every 3 months for as long as you are taking PrEP. Pregnancy  If you are premenopausal and you may become pregnant, ask your health care provider about preconception counseling.  If you may become pregnant, take 400 to 800 micrograms (mcg) of folic acid every day.  If you want to prevent pregnancy, talk to your health care provider about birth control (contraception). Osteoporosis and menopause  Osteoporosis is a disease in which the bones lose minerals and strength with aging. This can result in serious bone fractures. Your risk for osteoporosis can be identified using a bone density scan.  If you are 31 years  of age or older, or if you are at risk for osteoporosis and fractures, ask your health care provider if you should be screened.  Ask your health care provider whether you should take a calcium or vitamin D supplement to lower your risk for osteoporosis.  Menopause may have certain physical symptoms and risks.  Hormone replacement therapy may reduce some of these symptoms and risks. Talk to your health care provider about whether hormone replacement therapy is right for you. Follow these instructions at home:  Schedule regular health, dental, and eye exams.  Stay current with your immunizations.  Do not use any tobacco products including cigarettes, chewing tobacco, or electronic cigarettes.  If you are pregnant, do not drink alcohol.  If you are breastfeeding, limit how much and how often you drink alcohol.  Limit alcohol intake to no more than 1 drink per day for nonpregnant women. One drink equals 12 ounces of beer, 5 ounces of Jillaine Waren, or 1 ounces of hard liquor.  Do not use street drugs.  Do not share needles.  Ask your health care provider for help if you need support or information about quitting drugs.  Tell your health care provider if you often feel depressed.  Tell your health care provider if you have ever been abused or do not feel safe at home. This information is not intended to replace advice given to you by your health care provider. Make sure you discuss any questions you have with your health care provider. Document Released: 05/16/2011 Document Revised: 04/07/2016 Document Reviewed: 08/04/2015 Elsevier Interactive Patient Education  2018 [employer] Stress Reduction Mindfulness-based stress reduction (MBSR) is a program that helps people learn to practice mindfulness. Mindfulness is the practice of intentionally paying attention to the present moment. It can be learned and practiced through techniques such as education, breathing  exercises,  meditation, and yoga. MBSR includes several mindfulness techniques in one program. MBSR works best when you understand the treatment, are willing to try new things, and can commit to spending time practicing what you learn. MBSR training may include learning about:  How your emotions, thoughts, and reactions affect your body.  New ways to respond to things that cause negative thoughts to start (triggers).  How to notice your thoughts and let go of them.  Practicing awareness of everyday things that you normally do without thinking.  The techniques and goals of different types of meditation.  What are the benefits of MBSR? MBSR can have many benefits, which include helping you to:  Develop self-awareness. This refers to knowing and understanding yourself.  Learn skills and attitudes that help you to participate in your own health care.  Learn new ways to care for yourself.  Be more accepting about how things are, and let things go.  Be less judgmental and approach things with an open mind.  Be patient with yourself and trust yourself more.  MBSR has also been shown to:  Reduce negative emotions, such as depression and anxiety.  Improve memory and focus.  Change how you sense and approach pain.  Boost your body's ability to fight infections.  Help you connect better with other people.  Improve your sense of well-being.  Follow these instructions at home:  Find a local in-person or online MBSR program.  Set aside some time regularly for mindfulness practice.  Find a mindfulness practice that works best for you. This may include one or more of the following: ? Meditation. Meditation involves focusing your mind on a certain thought or activity. ? Breathing awareness exercises. These help you to stay present by focusing on your breath. ? Body scan. For this practice, you lie down and pay attention to each part of your body from head to toe. You can identify  tension and soreness and intentionally relax parts of your body. ? Yoga. Yoga involves stretching and breathing, and it can improve your ability to move and be flexible. It can also provide an experience of testing your body's limits, which can help you release stress. ? Mindful eating. This way of eating involves focusing on the taste, texture, color, and smell of each bite of food. Because this slows down eating and helps you feel full sooner, it can be an important part of a weight-loss plan.  Find a podcast or recording that provides guidance for breathing awareness, body scan, or meditation exercises. You can listen to these any time when you have a free moment to rest without distractions.  Follow your treatment plan as told by your health care provider. This may include taking regular medicines and making changes to your diet or lifestyle as recommended. How to practice mindfulness To do a basic awareness exercise:  Find a comfortable place to sit.  Pay attention to the present moment. Observe your thoughts, feelings, and surroundings just as they are.  Avoid placing judgment on yourself, your feelings, or your surroundings. Make note of any judgment that comes up, and let it go.  Your mind may wander, and that is okay. Make note of when your thoughts drift, and return your attention to the present moment.  To do basic mindfulness meditation:  Find a comfortable place to sit. This may include a stable chair or a firm floor cushion. ? Sit upright with your back straight. Let your arms fall next to your side with your hands  resting on your legs. ? If sitting in a chair, rest your feet flat on the floor. ? If sitting on a cushion, cross your legs in front of you.  Keep your head in a neutral position with your chin dropped slightly. Relax your jaw and rest the tip of your tongue on the roof of your mouth. Drop your gaze to the floor. You can close your eyes if you like.  Breathe  normally and pay attention to your breath. Feel the air moving in and out of your nose. Feel your belly expanding and relaxing with each breath.  Your mind may wander, and that is okay. Make note of when your thoughts drift, and return your attention to your breath.  Avoid placing judgment on yourself, your feelings, or your surroundings. Make note of any judgment or feelings that come up, let them go, and bring your attention back to your breath.  When you are ready, lift your gaze or open your eyes. Pay attention to how your body feels after the meditation.  Where to find more information: You can find more information about MBSR from:  Your health care provider.  Community-based meditation centers or programs.  Programs offered near you.  Summary  Mindfulness-based stress reduction (MBSR) is a program that teaches you how to intentionally pay attention to the present moment. It is used with other treatments to help you cope better with daily stress, emotions, and pain.  MBSR focuses on developing self-awareness, which allows you to respond to life stress without judgment or negative emotions.  MBSR programs may involve learning different mindfulness practices, such as breathing exercises, meditation, yoga, body scan, or mindful eating. Find a mindfulness practice that works best for you, and set aside time for it on a regular basis. This information is not intended to replace advice given to you by your health care provider. Make sure you discuss any questions you have with your health care provider. Document Released: 03/09/2017 Document Revised: 03/09/2017 Document Reviewed: 03/09/2017 Elsevier Interactive Patient Education  2018 [employer]. Insomnia Insomnia is a sleep disorder that makes it difficult to fall asleep or to stay asleep. Insomnia can cause tiredness (fatigue), low energy, difficulty concentrating, mood swings, and poor performance at work or school. There are three  different ways to classify insomnia:  Difficulty falling asleep.  Difficulty staying asleep.  Waking up too early in the morning.  Any type of insomnia can be long-term (chronic) or short-term (acute). Both are common. Short-term insomnia usually lasts for three months or less. Chronic insomnia occurs at least three times a week for longer than three months. What are the causes? Insomnia may be caused by another condition, situation, or substance, such as:  Anxiety.  Certain medicines.  Gastroesophageal reflux disease (GERD) or other gastrointestinal conditions.  Asthma or other breathing conditions.  Restless legs syndrome, sleep apnea, or other sleep disorders.  Chronic pain.  Menopause. This may include hot flashes.  Stroke.  Abuse of alcohol, tobacco, or illegal drugs.  Depression.  Caffeine.  Neurological disorders, such as Alzheimer disease.  An overactive thyroid (hyperthyroidism).  The cause of insomnia may not be known. What increases the risk? Risk factors for insomnia include:  Gender. Women are more commonly affected than men.  Age. Insomnia is more common as you get older.  Stress. This may involve your professional or personal life.  Income. Insomnia is more common in people with lower income.  Lack of exercise.  Irregular work schedule or night  shifts.  Traveling between different time zones.  What are the signs or symptoms? If you have insomnia, trouble falling asleep or trouble staying asleep is the main symptom. This may lead to other symptoms, such as:  Feeling fatigued.  Feeling nervous about going to sleep.  Not feeling rested in the morning.  Having trouble concentrating.  Feeling irritable, anxious, or depressed.  How is this treated? Treatment for insomnia depends on the cause. If your insomnia is caused by an underlying condition, treatment will focus on addressing the condition. Treatment may also include:  Medicines to  help you sleep.  Counseling or therapy.  Lifestyle adjustments.  Follow these instructions at home:  Take medicines only as directed by your health care provider.  Keep regular sleeping and waking hours. Avoid naps.  Keep a sleep diary to help you and your health care provider figure out what could be causing your insomnia. Include: ? When you sleep. ? When you wake up during the night. ? How well you sleep. ? How rested you feel the next day. ? Any side effects of medicines you are taking. ? What you eat and drink.  Make your bedroom a comfortable place where it is easy to fall asleep: ? Put up shades or special blackout curtains to block light from outside. ? Use a white noise machine to block noise. ? Keep the temperature cool.  Exercise regularly as directed by your health care provider. Avoid exercising right before bedtime.  Use relaxation techniques to manage stress. Ask your health care provider to suggest some techniques that may work well for you. These may include: ? Breathing exercises. ? Routines to release muscle tension. ? Visualizing peaceful scenes.  Cut back on alcohol, caffeinated beverages, and cigarettes, especially close to bedtime. These can disrupt your sleep.  Do not overeat or eat spicy foods right before bedtime. This can lead to digestive discomfort that can make it hard for you to sleep.  Limit screen use before bedtime. This includes: ? Watching TV. ? Using your smartphone, tablet, and computer.  Stick to a routine. This can help you fall asleep faster. Try to do a quiet activity, brush your teeth, and go to bed at the same time each night.  Get out of bed if you are still awake after 15 minutes of trying to sleep. Keep the lights down, but try reading or doing a quiet activity. When you feel sleepy, go back to bed.  Make sure that you drive carefully. Avoid driving if you feel very sleepy.  Keep all follow-up appointments as directed by  your health care provider. This is important. Contact a health care provider if:  You are tired throughout the day or have trouble in your daily routine due to sleepiness.  You continue to have sleep problems or your sleep problems get worse. Get help right away if:  You have serious thoughts about hurting yourself or someone else. This information is not intended to replace advice given to you by your health care provider. Make sure you discuss any questions you have with your health care provider. Document Released: 10/28/2000 Document Revised: 04/01/2016 Document Reviewed: 08/01/2014 Elsevier Interactive Patient Education  [employer].

## 2018-10-19 ENCOUNTER — Encounter: Payer: Self-pay | Admitting: Nurse Practitioner

## 2018-10-19 MED ORDER — METOPROLOL SUCCINATE ER 25 MG PO TB24: 25.0000 mg | ORAL_TABLET | Freq: Every day | ORAL | 1 refills | Status: DC

## 2018-11-15 ENCOUNTER — Other Ambulatory Visit: Payer: Self-pay | Admitting: Family

## 2018-12-13 ENCOUNTER — Other Ambulatory Visit: Payer: Self-pay | Admitting: Family

## 2018-12-13 ENCOUNTER — Encounter: Payer: Self-pay | Admitting: Nurse Practitioner

## 2018-12-14 ENCOUNTER — Other Ambulatory Visit: Payer: Self-pay | Admitting: *Deleted

## 2018-12-14 MED ORDER — NABUMETONE 500 MG PO TABS: ORAL_TABLET | ORAL | 0 refills | Status: DC

## 2019-02-06 ENCOUNTER — Other Ambulatory Visit: Payer: Self-pay | Admitting: Nurse Practitioner

## 2019-03-13 ENCOUNTER — Encounter: Payer: Self-pay | Admitting: Nurse Practitioner

## 2019-03-13 ENCOUNTER — Other Ambulatory Visit: Payer: Self-pay | Admitting: *Deleted

## 2019-03-13 ENCOUNTER — Other Ambulatory Visit: Payer: Self-pay | Admitting: Family

## 2019-03-13 MED ORDER — PIOGLITAZONE HCL 15 MG PO TABS: ORAL_TABLET | ORAL | 0 refills | Status: DC

## 2019-03-13 MED ORDER — ACCU-CHEK AVIVA PLUS VI STRP: ORAL_STRIP | 0 refills | Status: DC

## 2019-03-18 ENCOUNTER — Other Ambulatory Visit: Payer: Self-pay

## 2019-03-18 ENCOUNTER — Encounter: Payer: Self-pay | Admitting: Nurse Practitioner

## 2019-03-19 ENCOUNTER — Other Ambulatory Visit: Payer: Self-pay | Admitting: *Deleted

## 2019-03-19 MED ORDER — PIOGLITAZONE HCL 15 MG PO TABS: ORAL_TABLET | ORAL | 0 refills | Status: DC

## 2019-03-19 MED ORDER — OXYBUTYNIN CHLORIDE 5 MG PO TABS: ORAL_TABLET | ORAL | 0 refills | Status: DC

## 2019-03-27 ENCOUNTER — Ambulatory Visit: Payer: Medicare Other | Admitting: Nurse Practitioner

## 2019-05-06 ENCOUNTER — Other Ambulatory Visit: Payer: Self-pay | Admitting: *Deleted

## 2019-05-06 MED ORDER — METOPROLOL SUCCINATE ER 25 MG PO TB24: 25.0000 mg | ORAL_TABLET | Freq: Every day | ORAL | 0 refills | Status: DC

## 2019-05-08 ENCOUNTER — Ambulatory Visit: Payer: Medicare Other | Admitting: Nurse Practitioner

## 2019-05-09 ENCOUNTER — Encounter: Payer: Self-pay | Admitting: Internal Medicine

## 2019-05-09 ENCOUNTER — Other Ambulatory Visit: Payer: Self-pay | Admitting: Internal Medicine

## 2019-05-09 ENCOUNTER — Other Ambulatory Visit (INDEPENDENT_AMBULATORY_CARE_PROVIDER_SITE_OTHER): Payer: Medicare Other

## 2019-05-09 ENCOUNTER — Other Ambulatory Visit: Payer: Self-pay

## 2019-05-09 ENCOUNTER — Ambulatory Visit (INDEPENDENT_AMBULATORY_CARE_PROVIDER_SITE_OTHER): Payer: Medicare Other | Admitting: Internal Medicine

## 2019-05-09 VITALS — BP 126/86 | HR 55 | Temp 98.1°F | Ht 64.0 in | Wt 226.0 lb

## 2019-05-09 DIAGNOSIS — E538 Deficiency of other specified B group vitamins: Secondary | ICD-10-CM | POA: Diagnosis not present

## 2019-05-09 DIAGNOSIS — E119 Type 2 diabetes mellitus without complications: Secondary | ICD-10-CM

## 2019-05-09 DIAGNOSIS — E559 Vitamin D deficiency, unspecified: Secondary | ICD-10-CM | POA: Diagnosis not present

## 2019-05-09 DIAGNOSIS — Z0000 Encounter for general adult medical examination without abnormal findings: Secondary | ICD-10-CM | POA: Diagnosis not present

## 2019-05-09 DIAGNOSIS — E611 Iron deficiency: Secondary | ICD-10-CM

## 2019-05-09 DIAGNOSIS — Z23 Encounter for immunization: Secondary | ICD-10-CM | POA: Diagnosis not present

## 2019-05-09 LAB — LIPID PANEL
Cholesterol: 184 mg/dL (ref 0–200)
HDL: 45.4 mg/dL (ref >39.00)
LDL Cholesterol: 106 mg/dL — ABNORMAL HIGH (ref 0–99)
NonHDL: 138.17
Total CHOL/HDL Ratio: 4
Triglycerides: 161 mg/dL — ABNORMAL HIGH (ref 0.0–149.0)
VLDL: 32.2 mg/dL (ref 0.0–40.0)

## 2019-05-09 LAB — CBC WITH DIFFERENTIAL/PLATELET
Basophils Absolute: 0 10*3/uL (ref 0.0–0.1)
Basophils Relative: 0.5 % (ref 0.0–3.0)
Eosinophils Absolute: 0 10*3/uL (ref 0.0–0.7)
Eosinophils Relative: 0.7 % (ref 0.0–5.0)
HCT: 41 % (ref 36.0–46.0)
Hemoglobin: 13.9 g/dL (ref 12.0–15.0)
Lymphocytes Relative: 52.4 % — ABNORMAL HIGH (ref 12.0–46.0)
Lymphs Abs: 2 10*3/uL (ref 0.7–4.0)
MCHC: 33.8 g/dL (ref 30.0–36.0)
MCV: 95 fl (ref 78.0–100.0)
Monocytes Absolute: 0.2 10*3/uL (ref 0.1–1.0)
Monocytes Relative: 4.5 % (ref 3.0–12.0)
Neutro Abs: 1.6 10*3/uL (ref 1.4–7.7)
Neutrophils Relative %: 41.9 % — ABNORMAL LOW (ref 43.0–77.0)
Platelets: 188 10*3/uL (ref 150.0–400.0)
RBC: 4.32 Mil/uL (ref 3.87–5.11)
RDW: 12.6 % (ref 11.5–15.5)
WBC: 3.8 10*3/uL — ABNORMAL LOW (ref 4.0–10.5)

## 2019-05-09 LAB — BASIC METABOLIC PANEL
BUN: 14 mg/dL (ref 6–23)
CO2: 26 mEq/L (ref 19–32)
Calcium: 9.6 mg/dL (ref 8.4–10.5)
Chloride: 106 mEq/L (ref 96–112)
Creatinine, Ser: 0.73 mg/dL (ref 0.40–1.20)
GFR: 92.98 mL/min (ref >60.00)
Glucose, Bld: 89 mg/dL (ref 70–99)
Potassium: 4 mEq/L (ref 3.5–5.1)
Sodium: 139 mEq/L (ref 135–145)

## 2019-05-09 LAB — HEPATIC FUNCTION PANEL
ALT: 15 U/L (ref 0–35)
AST: 17 U/L (ref 0–37)
Albumin: 4.3 g/dL (ref 3.5–5.2)
Alkaline Phosphatase: 59 U/L (ref 39–117)
Bilirubin, Direct: 0.1 mg/dL (ref 0.0–0.3)
Total Bilirubin: 0.7 mg/dL (ref 0.2–1.2)
Total Protein: 7 g/dL (ref 6.0–8.3)

## 2019-05-09 LAB — VITAMIN B12: Vitamin B-12: >1515 pg/mL — ABNORMAL HIGH (ref 211–911)

## 2019-05-09 LAB — HEMOGLOBIN A1C: Hgb A1c MFr Bld: 5.7 % (ref 4.6–6.5)

## 2019-05-09 LAB — TSH: TSH: 1.16 u[IU]/mL (ref 0.35–4.50)

## 2019-05-09 LAB — VITAMIN D 25 HYDROXY (VIT D DEFICIENCY, FRACTURES): VITD: 16.44 ng/mL — ABNORMAL LOW (ref 30.00–100.00)

## 2019-05-09 LAB — IBC PANEL
Iron: 110 ug/dL (ref 42–145)
Saturation Ratios: 31.8 % (ref 20.0–50.0)
Transferrin: 247 mg/dL (ref 212.0–360.0)

## 2019-05-09 MED ORDER — VITAMIN D (ERGOCALCIFEROL) 1.25 MG (50000 UNIT) PO CAPS: 50000.0000 [IU] | ORAL_CAPSULE | ORAL | 0 refills | Status: DC

## 2019-05-09 NOTE — Progress Notes (Signed)
Subjective:    Patient ID: Kelly Bell, female    DOB: 1940-11-22, 78 y.o.   MRN: [id number]  HPI  Here for wellness and f/u;  Overall doing ok;  Pt denies Chest pain, worsening SOB, DOE, wheezing, orthopnea, PND, worsening LE edema, palpitations, dizziness or syncope.  Pt denies neurological change such as new headache, facial or extremity weakness.  Pt denies polydipsia, polyuria, but has had multiple low sugars recently. Pt states overall good compliance with treatment and medications, good tolerability, and has been trying to follow appropriate diet.  Pt denies worsening depressive symptoms, suicidal ideation or panic. No fever, night sweats, wt loss, loss of appetite, or other constitutional symptoms.  Pt states good ability with ADL's, has low fall risk, home safety reviewed and adequate, no other significant changes in hearing or vision, and only occasionally active with exercise. Husband died 02-28-2020with CHF , CKD, not under hospice.  Married 31 yrs, gieving but not overly depressed Past Medical History:  Diagnosis Date  . Allergy   . Arthritis    knees  . Breast cancer ([place])    Right  . HTN (hypertension)   . Hyperlipidemia   . Hypertension   . Irritable bladder   . Knee pain    Bilateral  . SVD (spontaneous vaginal delivery)    x 2  . Type II or unspecified type diabetes mellitus without mention of complication, not stated as uncontrolled    Past Surgical History:  Procedure Laterality Date  . ABDOMINAL HYSTERECTOMY  1975  . COLONOSCOPY  05/10/05, 01/11/2010   hx polyps  . MASTECTOMY PARTIAL / LUMPECTOMY W/ AXILLARY LYMPHADENECTOMY  07/1986  . MASTECTOMY W/ SENTINEL NODE BIOPSY Left 10/28/2015   Procedure: LEFT MASTECTOMY WITH SENTINEL LYMPH NODE BIOPSY;  Surgeon: Stark Klein, MD;  Location: [place];  Service: General;  Laterality: Left;  . SHOULDER ARTHROSCOPY  1999/01/11    reports that she has never smoked. She has never used smokeless tobacco. She reports  current alcohol use. She reports that she does not use drugs. family history includes Breast cancer in her sister; Breast cancer (age of onset: 50) in her sister; Cancer in her brother and cousin; Corneal Dystrophy in her son; Diabetes (age of onset: 29) in her father and mother; Esophageal cancer in her maternal grandfather; Healthy in her daughter; Heart disease in her father; Heart failure in her father; Pancreatic cancer in her mother; Rectal cancer in her maternal grandfather; Stomach cancer in her maternal grandfather. Allergies  Allergen Reactions  . Metformin And Related Diarrhea  . Pravastatin Other (See Comments)    Severe muscle cramps.  . Lisinopril     cough   Current Outpatient Medications on File Prior to Visit  Medication Sig Dispense Refill  . ACCU-CHEK AVIVA PLUS test strip USE TO CHECK BLOOD SUGAR ONCE DAILY. Dx: E11.9 100 each 0  . aspirin 325 MG tablet Take 325 mg by mouth daily.      . Blood Glucose Monitoring Suppl (ACCU-CHEK AVIVA PLUS) w/Device KIT USE AS DIRECTED TO CHECK GLUCOSE 1 kit 0  . Calcium Carbonate-Vitamin D (CALTRATE 600+D) 600-400 MG-UNIT per tablet Take 1 tablet by mouth 2 (two) times daily.    Marland Kitchen ezetimibe (ZETIA) 10 MG tablet Take 1 tablet (10 mg total) by mouth daily. (Patient taking differently: Take 10 mg by mouth every other day. ) 90 tablet 1  . fluticasone (FLONASE) 50 MCG/ACT nasal spray USE TWO SPRAY IN EACH NOSTRIL ONCE  DAILY 48 g 1  . metoprolol succinate (TOPROL-XL) 25 MG 24 hr tablet Take 1 tablet (25 mg total) by mouth daily. **KEEP 05/09/19 VISIT** 90 tablet 0  . nabumetone (RELAFEN) 500 MG tablet TAKE 1 TABLET BY MOUTH TWICE DAILY AS NEEDED AFTER A MEAL FOR ARTHRITIS 60 tablet 1  . oxybutynin (DITROPAN) 5 MG tablet TAKE 1 TABLET BY MOUTH 4 TIMES DAILY AS NEEDED. Needs visit for future refills. 120 tablet 0  . [DISCONTINUED] oxybutynin (DITROPAN) 5 MG tablet TAKE ONE TABLET BY MOUTH 4 TIMES DAILY AS NEEDED 120 tablet 5   No current  facility-administered medications on file prior to visit.    Review of Systems Constitutional: Negative for other unusual diaphoresis, sweats, appetite or weight changes HENT: Negative for other worsening hearing loss, ear pain, facial swelling, mouth sores or neck stiffness.   Eyes: Negative for other worsening pain, redness or other visual disturbance.  Respiratory: Negative for other stridor or swelling Cardiovascular: Negative for other palpitations or other chest pain  Gastrointestinal: Negative for worsening diarrhea or loose stools, blood in stool, distention or other pain Genitourinary: Negative for hematuria, flank pain or other change in urine volume.  Musculoskeletal: Negative for myalgias or other joint swelling.  Skin: Negative for other color change, or other wound or worsening drainage.  Neurological: Negative for other syncope or numbness. Hematological: Negative for other adenopathy or swelling Psychiatric/Behavioral: Negative for hallucinations, other worsening agitation, SI, self-injury, or new decreased concentration All other system neg per pt    Objective:   Physical Exam BP 126/86   Pulse (!) 55   Temp 98.1 F (36.7 C) (Oral)   Ht 5' 4"  (1.626 m)   Wt 226 lb (102.5 kg)   SpO2 96%   BMI 38.79 kg/m  VS noted,  Constitutional: Pt is oriented to person, place, and time. Appears well-developed and well-nourished, in no significant distress and comfortable Head: Normocephalic and atraumatic  Eyes: Conjunctivae and EOM are normal. Pupils are equal, round, and reactive to light Right Ear: External ear normal without discharge Left Ear: External ear normal without discharge Nose: Nose without discharge or deformity Mouth/Throat: Oropharynx is without other ulcerations and moist  Neck: Normal range of motion. Neck supple. No JVD present. No tracheal deviation present or significant neck LA or mass Cardiovascular: Normal rate, regular rhythm, normal heart sounds and  intact distal pulses.   Pulmonary/Chest: WOB normal and breath sounds without rales or wheezing  Abdominal: Soft. Bowel sounds are normal. NT. No HSM  Musculoskeletal: Normal range of motion. Exhibits no edema Lymphadenopathy: Has no other cervical adenopathy.  Neurological: Pt is alert and oriented to person, place, and time. Pt has normal reflexes. No cranial nerve deficit. Motor grossly intact, Gait intact Skin: Skin is warm and dry. No rash noted or new ulcerations Psychiatric:  Has normal mood and affect. Behavior is normal without agitation No other exam findings Lab Results  Component Value Date   WBC 3.8 (L) 05/09/2019   HGB 13.9 05/09/2019   HCT 41.0 05/09/2019   PLT 188.0 05/09/2019   GLUCOSE 89 05/09/2019   CHOL 184 05/09/2019   TRIG 161.0 (H) 05/09/2019   HDL 45.40 05/09/2019   LDLDIRECT 111.5 01/01/2007   LDLCALC 106 (H) 05/09/2019   ALT 15 05/09/2019   AST 17 05/09/2019   NA 139 05/09/2019   K 4.0 05/09/2019   CL 106 05/09/2019   CREATININE 0.73 05/09/2019   BUN 14 05/09/2019   CO2 26 05/09/2019   TSH  1.16 05/09/2019   HGBA1C 5.7 05/09/2019   MICROALBUR 1.2 09/28/2018      Assessment & Plan:

## 2019-05-09 NOTE — Patient Instructions (Addendum)
You had the Tdap tetanus shot  Ok to stop the actos for now  Please continue all other medications as before, and refills have been done if requested.  Please have the pharmacy call with any other refills you may need.  Please continue your efforts at being more active, low cholesterol diet, and weight control.  You are otherwise up to date with prevention measures today.  Please keep your appointments with your specialists as you may have planned  Please go to the LAB in the Basement (turn left off the elevator) for the tests to be done today  You will be contacted by phone if any changes need to be made immediately.  Otherwise, you will receive a letter about your results with an explanation, but please check with MyChart first.  Please remember to sign up for MyChart if you have not done so, as this will be important to you in the future with finding out test results, communicating by private email, and scheduling acute appointments online when needed.  Please return in 6 months, or sooner if needed, with Lab testing done 3-5 days before

## 2019-05-10 ENCOUNTER — Telehealth: Payer: Self-pay

## 2019-05-10 NOTE — Telephone Encounter (Signed)
-----   Message from Biagio Borg, MD sent at 05/09/2019  7:26 PM EDT ----- Left message on MyChart, pt to cont same tx except  The test results show that your current treatment is OK, except the Vitamin D level is low.  Please take Vitamin D 50000 units weekly for 12 weeks, then plan to change to OTC Vitamin D3 at 2000 units per day, indefinitely.  Safwan Tomei to please inform pt, I will do rx

## 2019-05-10 NOTE — Telephone Encounter (Signed)
Pt has viewed results via MyChart  

## 2019-05-12 ENCOUNTER — Encounter: Payer: Self-pay | Admitting: Internal Medicine

## 2019-05-12 NOTE — Assessment & Plan Note (Signed)
Over controlled, ok to d/c actos, continue to monitor cbgs,  to f/u any worsening symptoms or concerns

## 2019-05-12 NOTE — Assessment & Plan Note (Signed)

## 2019-05-17 ENCOUNTER — Other Ambulatory Visit: Payer: Self-pay | Admitting: Internal Medicine

## 2019-06-27 ENCOUNTER — Other Ambulatory Visit: Payer: Self-pay

## 2019-06-27 MED ORDER — EZETIMIBE 10 MG PO TABS: 10.0000 mg | ORAL_TABLET | Freq: Every day | ORAL | 3 refills | Status: DC

## 2019-07-03 ENCOUNTER — Other Ambulatory Visit: Payer: Self-pay | Admitting: Internal Medicine

## 2019-07-19 ENCOUNTER — Telehealth: Payer: Self-pay | Admitting: Internal Medicine

## 2019-07-21 ENCOUNTER — Other Ambulatory Visit: Payer: Self-pay | Admitting: Internal Medicine

## 2019-07-23 ENCOUNTER — Encounter: Payer: Self-pay | Admitting: Internal Medicine

## 2019-07-23 NOTE — Telephone Encounter (Signed)
Pt informed this was a once time Rx and she expressed understanding she states she has OTC  Vitamin D at her house already.

## 2019-07-23 NOTE — Telephone Encounter (Signed)
Pt want to know why her Vitamin D was denied because she was just in the office in June.  Please call pt to advise

## 2019-07-31 ENCOUNTER — Other Ambulatory Visit: Payer: Self-pay | Admitting: Internal Medicine

## 2019-08-12 ENCOUNTER — Other Ambulatory Visit: Payer: Self-pay

## 2019-08-12 ENCOUNTER — Ambulatory Visit (INDEPENDENT_AMBULATORY_CARE_PROVIDER_SITE_OTHER): Payer: Medicare Other

## 2019-08-12 DIAGNOSIS — Z23 Encounter for immunization: Secondary | ICD-10-CM | POA: Diagnosis not present

## 2019-08-21 ENCOUNTER — Other Ambulatory Visit: Payer: Self-pay | Admitting: Internal Medicine

## 2019-10-04 ENCOUNTER — Other Ambulatory Visit: Payer: Self-pay | Admitting: Internal Medicine

## 2019-10-17 ENCOUNTER — Telehealth: Payer: Self-pay | Admitting: Internal Medicine

## 2019-10-17 NOTE — Telephone Encounter (Signed)
Attempted to call patient to schedule Annual Wellness Visit, but patient did not answer. Will try to call patient again at a later time. SF °

## 2019-10-29 ENCOUNTER — Encounter: Payer: Self-pay | Admitting: Internal Medicine

## 2019-10-29 ENCOUNTER — Ambulatory Visit (INDEPENDENT_AMBULATORY_CARE_PROVIDER_SITE_OTHER): Payer: Medicare Other | Admitting: Internal Medicine

## 2019-10-29 ENCOUNTER — Other Ambulatory Visit: Payer: Self-pay

## 2019-10-29 VITALS — BP 140/90 | HR 70 | Temp 97.5°F | Ht 64.0 in | Wt 225.0 lb

## 2019-10-29 DIAGNOSIS — E785 Hyperlipidemia, unspecified: Secondary | ICD-10-CM

## 2019-10-29 DIAGNOSIS — I10 Essential (primary) hypertension: Secondary | ICD-10-CM | POA: Diagnosis not present

## 2019-10-29 DIAGNOSIS — E559 Vitamin D deficiency, unspecified: Secondary | ICD-10-CM

## 2019-10-29 DIAGNOSIS — E119 Type 2 diabetes mellitus without complications: Secondary | ICD-10-CM | POA: Diagnosis not present

## 2019-10-29 DIAGNOSIS — Z0000 Encounter for general adult medical examination without abnormal findings: Secondary | ICD-10-CM

## 2019-10-29 DIAGNOSIS — F4321 Adjustment disorder with depressed mood: Secondary | ICD-10-CM | POA: Insufficient documentation

## 2019-10-29 MED ORDER — METOPROLOL SUCCINATE ER 25 MG PO TB24: 25.0000 mg | ORAL_TABLET | Freq: Every day | ORAL | 3 refills | Status: DC

## 2019-10-29 NOTE — Progress Notes (Signed)
Subjective:    Patient ID: Kelly Bell, female    DOB: [birth year], 78 y.o.   MRN: [id number]  HPI Here to f/u; overall doing ok,  Pt denies chest pain, increasing sob or doe, wheezing, orthopnea, PND, increased LE swelling, palpitations, dizziness or syncope.  Pt denies new neurological symptoms such as new headache, or facial or extremity weakness or numbness.  Pt denies polydipsia, polyuria, or low sugar episode.  Pt states overall good compliance with meds, mostly trying to follow appropriate diet, with wt overall stable,  but little exercise however.  Wt Readings from Last 3 Encounters:  10/29/19 225 lb (102.1 kg)  05/09/19 226 lb (102.5 kg)  10/08/18 229 lb (103.9 kg)  Taking statin M-W -F due to myalgias, just cannot take more  . Grieving as husband recently passed.  Has had mild worsening reflux, but no abd pain, dysphagia, n/v, bowel change or blood. Past Medical History:  Diagnosis Date  . Allergy   . Arthritis    knees  . Breast cancer ([place])    Right  . HTN (hypertension)   . Hyperlipidemia   . Hypertension   . Irritable bladder   . Knee pain    Bilateral  . SVD (spontaneous vaginal delivery)    x 2  . Type II or unspecified type diabetes mellitus without mention of complication, not stated as uncontrolled    Past Surgical History:  Procedure Laterality Date  . ABDOMINAL HYSTERECTOMY  1975  . COLONOSCOPY  05/10/05, 2011   hx polyps  . MASTECTOMY PARTIAL / LUMPECTOMY W/ AXILLARY LYMPHADENECTOMY  07/1986  . MASTECTOMY W/ SENTINEL NODE BIOPSY Left 10/28/2015   Procedure: LEFT MASTECTOMY WITH SENTINEL LYMPH NODE BIOPSY;  Surgeon: Stark Klein, MD;  Location: [facility];  Service: General;  Laterality: Left;  . SHOULDER ARTHROSCOPY  2000    reports that she has never smoked. She has never used smokeless tobacco. She reports current alcohol use. She reports that she does not use drugs. family history includes Breast cancer in her sister; Breast cancer (age of  onset: 46) in her sister; Cancer in her brother and cousin; Corneal Dystrophy in her son; Diabetes (age of onset: 49) in her father and mother; Esophageal cancer in her maternal grandfather; Healthy in her daughter; Heart disease in her father; Heart failure in her father; Pancreatic cancer in her mother; Rectal cancer in her maternal grandfather; Stomach cancer in her maternal grandfather. Allergies  Allergen Reactions  . Metformin And Related Diarrhea  . Pravastatin Other (See Comments)    Severe muscle cramps.  . Lisinopril     cough   Current Outpatient Medications on File Prior to Visit  Medication Sig Dispense Refill  . ACCU-CHEK AVIVA PLUS test strip USE TO CHECK BLOOD SUGAR ONCE DAILY. Dx: E11.9 100 each 0  . aspirin 325 MG tablet Take 325 mg by mouth daily.      . Blood Glucose Monitoring Suppl (ACCU-CHEK AVIVA PLUS) w/Device KIT USE AS DIRECTED TO CHECK GLUCOSE 1 kit 0  . Calcium Carbonate-Vitamin D (CALTRATE 600+D) 600-400 MG-UNIT per tablet Take 1 tablet by mouth 2 (two) times daily.    Marland Kitchen ezetimibe (ZETIA) 10 MG tablet Take 1 tablet (10 mg total) by mouth daily. 90 tablet 3  . fluticasone (FLONASE) 50 MCG/ACT nasal spray USE TWO SPRAY IN EACH NOSTRIL ONCE DAILY 48 g 1  . nabumetone (RELAFEN) 500 MG tablet TAKE 1 TABLET BY MOUTH TWICE DAILY AS NEEDED AFTER A MEAL  FOR ARTHRITIS 60 tablet 1  . oxybutynin (DITROPAN) 5 MG tablet TAKE 1 TABLET BY MOUTH 4 TIMES DAILY . APPOINTMENT REQUIRED FOR FUTURE REFILLS 120 tablet 0  . Vitamin D, Ergocalciferol, (DRISDOL) 1.25 MG (50000 UT) CAPS capsule Take 1 capsule (50,000 Units total) by mouth every 7 (seven) days. 12 capsule 0   No current facility-administered medications on file prior to visit.   Review of Systems  Constitutional: Negative for other unusual diaphoresis or sweats HENT: Negative for ear discharge or swelling Eyes: Negative for other worsening visual disturbances Respiratory: Negative for stridor or other swelling   Gastrointestinal: Negative for worsening distension or other blood Genitourinary: Negative for retention or other urinary change Musculoskeletal: Negative for other MSK pain or swelling Skin: Negative for color change or other new lesions Neurological: Negative for worsening tremors and other numbness  Psychiatric/Behavioral: Negative for worsening agitation or other fatigue All otherwise neg per pt     Objective:   Physical Exam BP 140/90 (BP Location: Left Arm, Patient Position: Sitting, Cuff Size: Large)   Pulse 70   Temp (!) 97.5 F (36.4 C) (Oral)   Ht 5' 4" (1.626 m)   Wt 225 lb (102.1 kg)   SpO2 97%   BMI 38.62 kg/m  VS noted,  Constitutional: Pt appears in NAD HENT: Head: NCAT.  Right Ear: External ear normal.  Left Ear: External ear normal.  Eyes: . Pupils are equal, round, and reactive to light. Conjunctivae and EOM are normal Nose: without d/c or deformity Neck: Neck supple. Gross normal ROM Cardiovascular: Normal rate and regular rhythm.   Pulmonary/Chest: Effort normal and breath sounds without rales or wheezing.  Abd:  Soft, NT, ND, + BS, no organomegaly Neurological: Pt is alert. At baseline orientation, motor grossly intact Skin: Skin is warm. No rashes, other new lesions, no LE edema Psychiatric: Pt behavior is normal without agitation  All otherwise neg per pt  Lab Results  Component Value Date   WBC 3.8 (L) 05/09/2019   HGB 13.9 05/09/2019   HCT 41.0 05/09/2019   PLT 188.0 05/09/2019   GLUCOSE 89 05/09/2019   CHOL 184 05/09/2019   TRIG 161.0 (H) 05/09/2019   HDL 45.40 05/09/2019   LDLDIRECT 111.5 01/01/2007   LDLCALC 106 (H) 05/09/2019   ALT 15 05/09/2019   AST 17 05/09/2019   NA 139 05/09/2019   K 4.0 05/09/2019   CL 106 05/09/2019   CREATININE 0.73 05/09/2019   BUN 14 05/09/2019   CO2 26 05/09/2019   TSH 1.16 05/09/2019   HGBA1C 5.7 05/09/2019   MICROALBUR 1.2 09/28/2018       Assessment & Plan:

## 2019-10-29 NOTE — Patient Instructions (Signed)
Please continue all other medications as before, and refills have been done if requested.  Please have the pharmacy call with any other refills you may need.  Please continue your efforts at being more active, low cholesterol diet, and weight control.  You are otherwise up to date with prevention measures today.  Please keep your appointments with your specialists as you may have planned  Please return in 6 months, or sooner if needed, with Lab testing done 3-5 days before  

## 2019-11-02 ENCOUNTER — Encounter: Payer: Self-pay | Admitting: Internal Medicine

## 2019-11-02 NOTE — Assessment & Plan Note (Signed)
Mild reassured, declines counseling referral

## 2019-11-02 NOTE — Assessment & Plan Note (Signed)
stable overall by history and exam, recent data reviewed with pt, and pt to continue medical treatment as before,  to f/u any worsening symptoms or concerns  

## 2019-11-02 NOTE — Assessment & Plan Note (Signed)
To cont oral replacement, f/u lab next visit

## 2019-12-03 ENCOUNTER — Other Ambulatory Visit: Payer: Self-pay | Admitting: Internal Medicine

## 2019-12-08 ENCOUNTER — Other Ambulatory Visit: Payer: Self-pay | Admitting: Internal Medicine

## 2019-12-10 ENCOUNTER — Other Ambulatory Visit: Payer: Self-pay | Admitting: Internal Medicine

## 2019-12-10 NOTE — Telephone Encounter (Signed)
Please refill as per office routine med refill policy (all routine meds refilled for 3 mo or monthly per pt preference up to one year from last visit, then month to month grace period for 3 mo, then further med refills will have to be denied)  

## 2019-12-15 ENCOUNTER — Encounter: Payer: Self-pay | Admitting: Internal Medicine

## 2019-12-15 MED ORDER — OXYBUTYNIN CHLORIDE 5 MG PO TABS: 5.0000 mg | ORAL_TABLET | Freq: Two times a day (BID) | ORAL | 1 refills | Status: DC

## 2019-12-15 MED ORDER — OXYBUTYNIN CHLORIDE 5 MG PO TABS: 5.0000 mg | ORAL_TABLET | Freq: Four times a day (QID) | ORAL | 5 refills | Status: DC

## 2019-12-15 NOTE — Addendum Note (Signed)
Addended by: Karle Barr on: 12/15/2019 08:13 AM   Modules accepted: Orders

## 2020-01-06 ENCOUNTER — Encounter: Payer: Self-pay | Admitting: Internal Medicine

## 2020-01-08 ENCOUNTER — Other Ambulatory Visit: Payer: Self-pay

## 2020-01-08 MED ORDER — NABUMETONE 500 MG PO TABS: ORAL_TABLET | ORAL | 1 refills | Status: DC

## 2020-03-26 ENCOUNTER — Other Ambulatory Visit: Payer: Self-pay | Admitting: Internal Medicine

## 2020-04-07 DIAGNOSIS — C50912 Malignant neoplasm of unspecified site of left female breast: Secondary | ICD-10-CM | POA: Diagnosis not present

## 2020-04-07 DIAGNOSIS — C50911 Malignant neoplasm of unspecified site of right female breast: Secondary | ICD-10-CM | POA: Diagnosis not present

## 2020-06-02 DIAGNOSIS — C50912 Malignant neoplasm of unspecified site of left female breast: Secondary | ICD-10-CM | POA: Diagnosis not present

## 2020-06-02 DIAGNOSIS — C50911 Malignant neoplasm of unspecified site of right female breast: Secondary | ICD-10-CM | POA: Diagnosis not present

## 2020-06-03 ENCOUNTER — Other Ambulatory Visit: Payer: Self-pay

## 2020-06-03 ENCOUNTER — Ambulatory Visit (INDEPENDENT_AMBULATORY_CARE_PROVIDER_SITE_OTHER): Payer: Medicare PPO | Admitting: Internal Medicine

## 2020-06-03 ENCOUNTER — Encounter: Payer: Self-pay | Admitting: Internal Medicine

## 2020-06-03 VITALS — BP 160/80 | HR 68 | Temp 98.9°F | Ht 64.0 in | Wt 224.0 lb

## 2020-06-03 DIAGNOSIS — I10 Essential (primary) hypertension: Secondary | ICD-10-CM

## 2020-06-03 DIAGNOSIS — E559 Vitamin D deficiency, unspecified: Secondary | ICD-10-CM | POA: Diagnosis not present

## 2020-06-03 DIAGNOSIS — E785 Hyperlipidemia, unspecified: Secondary | ICD-10-CM | POA: Diagnosis not present

## 2020-06-03 DIAGNOSIS — R011 Cardiac murmur, unspecified: Secondary | ICD-10-CM

## 2020-06-03 DIAGNOSIS — Z0000 Encounter for general adult medical examination without abnormal findings: Secondary | ICD-10-CM | POA: Diagnosis not present

## 2020-06-03 DIAGNOSIS — E119 Type 2 diabetes mellitus without complications: Secondary | ICD-10-CM

## 2020-06-03 DIAGNOSIS — Z0001 Encounter for general adult medical examination with abnormal findings: Secondary | ICD-10-CM | POA: Diagnosis not present

## 2020-06-03 LAB — COMPLETE METABOLIC PANEL WITH GFR
AG Ratio: 1.8 (calc) (ref 1.0–2.5)
ALT: 16 U/L (ref 6–29)
AST: 16 U/L (ref 10–35)
Albumin: 4.4 g/dL (ref 3.6–5.1)
Alkaline phosphatase (APISO): 60 U/L (ref 37–153)
BUN: 13 mg/dL (ref 7–25)
CO2: 28 mmol/L (ref 20–32)
Calcium: 10.1 mg/dL (ref 8.6–10.4)
Chloride: 106 mmol/L (ref 98–110)
Creat: 0.79 mg/dL (ref 0.60–0.88)
GFR, Est African American: 82 mL/min/{1.73_m2} (ref >=60)
GFR, Est Non African American: 71 mL/min/{1.73_m2} (ref >=60)
Globulin: 2.5 g/dL (calc) (ref 1.9–3.7)
Glucose, Bld: 126 mg/dL — ABNORMAL HIGH (ref 65–99)
Potassium: 4.3 mmol/L (ref 3.5–5.3)
Sodium: 140 mmol/L (ref 135–146)
Total Bilirubin: 0.8 mg/dL (ref 0.2–1.2)
Total Protein: 6.9 g/dL (ref 6.1–8.1)

## 2020-06-03 NOTE — Progress Notes (Signed)
Subjective:    Patient ID: Kelly Bell, female    DOB: 1941/05/14, 79 y.o.   MRN: [id number]  HPI  Here for wellness and f/u;  Overall doing ok;  Pt denies Chest pain, worsening SOB, DOE, wheezing, orthopnea, PND, worsening LE edema, palpitations, dizziness or syncope.  Pt denies neurological change such as new headache, facial or extremity weakness.  Pt denies polydipsia, polyuria, or low sugar symptoms. Pt states overall good compliance with treatment and medications, good tolerability, and has been trying to follow appropriate diet.  Pt denies worsening depressive symptoms, suicidal ideation or panic. No fever, night sweats, wt loss, loss of appetite, or other constitutional symptoms.  Pt states good ability with ADL's, has low fall risk, home safety reviewed and adequate, no other significant changes in hearing or vision, and only occasionally active with exercise. Husband died January 26, 2019. BPat home good < 140/90. Tolerating otc vit d.  Past Medical History:  Diagnosis Date  . Allergy   . Arthritis    knees  . Breast cancer ([place])    Right  . HTN (hypertension)   . Hyperlipidemia   . Hypertension   . Irritable bladder   . Knee pain    Bilateral  . SVD (spontaneous vaginal delivery)    x 2  . Type II or unspecified type diabetes mellitus without mention of complication, not stated as uncontrolled    Past Surgical History:  Procedure Laterality Date  . ABDOMINAL HYSTERECTOMY  1975  . COLONOSCOPY  05/10/05, Jan 26, 2010   hx polyps  . MASTECTOMY PARTIAL / LUMPECTOMY W/ AXILLARY LYMPHADENECTOMY  07/1986  . MASTECTOMY W/ SENTINEL NODE BIOPSY Left 10/28/2015   Procedure: LEFT MASTECTOMY WITH SENTINEL LYMPH NODE BIOPSY;  Surgeon: Stark Klein, MD;  Location: [place];  Service: General;  Laterality: Left;  . SHOULDER ARTHROSCOPY  01/26/99    reports that she has never smoked. She has never used smokeless tobacco. She reports current alcohol use. She reports that she does not use  drugs. family history includes Breast cancer in her sister; Breast cancer (age of onset: 44) in her sister; Cancer in her brother and cousin; Corneal Dystrophy in her son; Diabetes (age of onset: 60) in her father and mother; Esophageal cancer in her maternal grandfather; Healthy in her daughter; Heart disease in her father; Heart failure in her father; Pancreatic cancer in her mother; Rectal cancer in her maternal grandfather; Stomach cancer in her maternal grandfather. Allergies  Allergen Reactions  . Metformin And Related Diarrhea  . Pravastatin Other (See Comments)    Severe muscle cramps.  . Lisinopril     cough   Current Outpatient Medications on File Prior to Visit  Medication Sig Dispense Refill  . ACCU-CHEK AVIVA PLUS test strip USE TO CHECK BLOOD SUGAR ONCE DAILY. Dx: E11.9 100 each 0  . aspirin 325 MG tablet Take 325 mg by mouth daily.      . Blood Glucose Monitoring Suppl (ACCU-CHEK AVIVA PLUS) w/Device KIT USE AS DIRECTED TO CHECK GLUCOSE 1 kit 0  . Calcium Carbonate-Vitamin D (CALTRATE 600+D) 600-400 MG-UNIT per tablet Take 1 tablet by mouth 2 (two) times daily.    Marland Kitchen ezetimibe (ZETIA) 10 MG tablet Take 1 tablet (10 mg total) by mouth daily. 90 tablet 3  . fluticasone (FLONASE) 50 MCG/ACT nasal spray USE TWO SPRAY IN EACH NOSTRIL ONCE DAILY 48 g 1  . metoprolol succinate (TOPROL-XL) 25 MG 24 hr tablet Take 1 tablet (25 mg total) by  mouth daily. 90 tablet 3  . nabumetone (RELAFEN) 500 MG tablet TAKE 1 TABLET BY MOUTH TWICE DAILY AS NEEDED AFTER A MEAL FOR ARTHRITIS 60 tablet 0  . oxybutynin (DITROPAN) 5 MG tablet Take 1 tablet (5 mg total) by mouth 4 (four) times daily. 120 tablet 5   No current facility-administered medications on file prior to visit.   Review of Systems All otherwise neg per pt    Objective:   Physical Exam BP (!) 160/80 (BP Location: Left Arm, Patient Position: Sitting, Cuff Size: Large)   Pulse 68   Temp 98.9 F (37.2 C) (Oral)   Ht 5' 4"  (1.626 m)    Wt 224 lb (101.6 kg)   SpO2 96%   BMI 38.45 kg/m  VS noted,  Constitutional: Pt appears in NAD HENT: Head: NCAT.  Right Ear: External ear normal.  Left Ear: External ear normal.  Eyes: . Pupils are equal, round, and reactive to light. Conjunctivae and EOM are normal Nose: without d/c or deformity Neck: Neck supple. Gross normal ROM Cardiovascular: Normal rate and regular rhythm.   Pulmonary/Chest: Effort normal and breath sounds without rales or wheezing.  Abd:  Soft, NT, ND, + BS, no organomegaly Neurological: Pt is alert. At baseline orientation, motor grossly intact Skin: Skin is warm. No rashes, other new lesions, no LE edema Psychiatric: Pt behavior is normal without agitation  All otherwise neg per pt Gr 2/6 sys murmur RUSB Lab Results  Component Value Date   WBC 3.8 (L) 05/09/2019   HGB 13.9 05/09/2019   HCT 41.0 05/09/2019   PLT 188.0 05/09/2019   GLUCOSE 89 05/09/2019   CHOL 184 05/09/2019   TRIG 161.0 (H) 05/09/2019   HDL 45.40 05/09/2019   LDLDIRECT 111.5 01/01/2007   LDLCALC 106 (H) 05/09/2019   ALT 15 05/09/2019   AST 17 05/09/2019   NA 139 05/09/2019   K 4.0 05/09/2019   CL 106 05/09/2019   CREATININE 0.73 05/09/2019   BUN 14 05/09/2019   CO2 26 05/09/2019   TSH 1.16 05/09/2019   HGBA1C 5.7 05/09/2019   MICROALBUR 1.2 09/28/2018      Assessment & Plan:

## 2020-06-03 NOTE — Assessment & Plan Note (Addendum)
For echo r/o AS - d/w pt  I spent 21 minutes in preparing to see the patient by review of recent labs, imaging and procedures, obtaining and reviewing separately obtained history, communicating with the patient and family or caregiver, ordering medications, tests or procedures, and documenting clinical information in the EHR including the differential Dx, treatment, and any further evaluation and other management of heart murmur, dh, htn, hld, vit d deficiency

## 2020-06-03 NOTE — Assessment & Plan Note (Signed)
Cont oral replacement 

## 2020-06-03 NOTE — Assessment & Plan Note (Signed)
stable overall by history and exam, recent data reviewed with pt, and pt to continue medical treatment as before,  to f/u any worsening symptoms or concerns  

## 2020-06-03 NOTE — Patient Instructions (Signed)
Please continue all other medications as before, and refills have been done if requested.  Please have the pharmacy call with any other refills you may need.  Please continue your efforts at being more active, low cholesterol diet, and weight control.  You are otherwise up to date with prevention measures today.  Please keep your appointments with your specialists as you may have planned  You will be contacted regarding the referral for: echocardiogram  Please go to the LAB at the blood drawing area for the tests to be done  You will be contacted by phone if any changes need to be made immediately.  Otherwise, you will receive a letter about your results with an explanation, but please check with MyChart first.  Please remember to sign up for MyChart if you have not done so, as this will be important to you in the future with finding out test results, communicating by private email, and scheduling acute appointments online when needed.  Please make an Appointment to return in 6 months, or sooner if needed

## 2020-06-03 NOTE — Assessment & Plan Note (Signed)

## 2020-06-04 ENCOUNTER — Encounter: Payer: Self-pay | Admitting: Internal Medicine

## 2020-06-04 LAB — URINALYSIS, ROUTINE W REFLEX MICROSCOPIC
Bilirubin Urine: NEGATIVE
Glucose, UA: NEGATIVE
Hgb urine dipstick: NEGATIVE
Ketones, ur: NEGATIVE
Leukocytes,Ua: NEGATIVE
Nitrite: NEGATIVE
Protein, ur: NEGATIVE
Specific Gravity, Urine: 1.009 (ref 1.001–1.03)
pH: 7 (ref 5.0–8.0)

## 2020-06-04 LAB — LIPID PANEL
Cholesterol: 215 mg/dL — ABNORMAL HIGH (ref <200)
HDL: 46 mg/dL — ABNORMAL LOW (ref >=50)
LDL Cholesterol (Calc): 144 mg/dL (calc) — ABNORMAL HIGH
Non-HDL Cholesterol (Calc): 169 mg/dL (calc) — ABNORMAL HIGH (ref <130)
Total CHOL/HDL Ratio: 4.7 (calc) (ref <5.0)
Triglycerides: 123 mg/dL (ref <150)

## 2020-06-04 LAB — HEMOGLOBIN A1C
Hgb A1c MFr Bld: 6.1 % of total Hgb — ABNORMAL HIGH (ref <5.7)
Mean Plasma Glucose: 128 (calc)
eAG (mmol/L): 7.1 (calc)

## 2020-06-04 LAB — CBC WITH DIFFERENTIAL/PLATELET
Absolute Monocytes: 218 cells/uL (ref 200–950)
Basophils Absolute: 31 cells/uL (ref 0–200)
Basophils Relative: 0.8 %
Eosinophils Absolute: 59 cells/uL (ref 15–500)
Eosinophils Relative: 1.5 %
HCT: 42.7 % (ref 35.0–45.0)
Hemoglobin: 14.4 g/dL (ref 11.7–15.5)
Lymphs Abs: 1677 cells/uL (ref 850–3900)
MCH: 31.2 pg (ref 27.0–33.0)
MCHC: 33.7 g/dL (ref 32.0–36.0)
MCV: 92.6 fL (ref 80.0–100.0)
MPV: 11.8 fL (ref 7.5–12.5)
Monocytes Relative: 5.6 %
Neutro Abs: 1915 cells/uL (ref 1500–7800)
Neutrophils Relative %: 49.1 %
Platelets: 197 10*3/uL (ref 140–400)
RBC: 4.61 10*6/uL (ref 3.80–5.10)
RDW: 12.2 % (ref 11.0–15.0)
Total Lymphocyte: 43 %
WBC: 3.9 10*3/uL (ref 3.8–10.8)

## 2020-06-04 LAB — MICROALBUMIN / CREATININE URINE RATIO
Creatinine, Urine: 42 mg/dL (ref 20–275)
Microalb Creat Ratio: 26 mcg/mg creat (ref <30)
Microalb, Ur: 1.1 mg/dL

## 2020-06-04 LAB — TSH: TSH: 1.29 mIU/L (ref 0.40–4.50)

## 2020-06-04 LAB — VITAMIN D 25 HYDROXY (VIT D DEFICIENCY, FRACTURES): Vit D, 25-Hydroxy: 24 ng/mL — ABNORMAL LOW (ref 30–100)

## 2020-06-30 ENCOUNTER — Other Ambulatory Visit: Payer: Self-pay

## 2020-06-30 ENCOUNTER — Ambulatory Visit (HOSPITAL_COMMUNITY): Payer: Medicare PPO | Attending: Internal Medicine

## 2020-06-30 DIAGNOSIS — R011 Cardiac murmur, unspecified: Secondary | ICD-10-CM | POA: Insufficient documentation

## 2020-06-30 LAB — ECHOCARDIOGRAM COMPLETE
Area-P 1/2: 2.73 cm2
S' Lateral: 2.2 cm

## 2020-07-01 ENCOUNTER — Encounter: Payer: Self-pay | Admitting: Internal Medicine

## 2020-07-07 ENCOUNTER — Ambulatory Visit (INDEPENDENT_AMBULATORY_CARE_PROVIDER_SITE_OTHER): Payer: Medicare PPO | Admitting: Internal Medicine

## 2020-07-07 ENCOUNTER — Other Ambulatory Visit: Payer: Self-pay

## 2020-07-07 ENCOUNTER — Encounter: Payer: Self-pay | Admitting: Internal Medicine

## 2020-07-07 VITALS — BP 130/80 | HR 53 | Temp 98.1°F | Resp 16 | Ht 64.0 in | Wt 224.0 lb

## 2020-07-07 DIAGNOSIS — Z23 Encounter for immunization: Secondary | ICD-10-CM | POA: Diagnosis not present

## 2020-07-07 DIAGNOSIS — I10 Essential (primary) hypertension: Secondary | ICD-10-CM | POA: Diagnosis not present

## 2020-07-07 DIAGNOSIS — M170 Bilateral primary osteoarthritis of knee: Secondary | ICD-10-CM

## 2020-07-07 DIAGNOSIS — J301 Allergic rhinitis due to pollen: Secondary | ICD-10-CM | POA: Diagnosis not present

## 2020-07-07 MED ORDER — FLUTICASONE PROPIONATE 50 MCG/ACT NA SUSP: NASAL | 1 refills | Status: DC

## 2020-07-07 MED ORDER — HYDROCODONE-ACETAMINOPHEN 5-325 MG PO TABS: 1.0000 | ORAL_TABLET | Freq: Four times a day (QID) | ORAL | 0 refills | Status: DC | PRN

## 2020-07-07 NOTE — Progress Notes (Signed)
Subjective:  Patient ID: Kelly Bell, female    DOB: 1941-05-27  Age: 79 y.o. MRN: [id number]  CC: Osteoarthritis, Hypertension, Diabetes, and Allergic Rhinitis   This visit occurred during the SARS-CoV-2 public health emergency.  Safety protocols were in place, including screening questions prior to the visit, additional usage of staff PPE, and extensive cleaning of exam room while observing appropriate contact time as indicated for disinfecting solutions.    HPI Kelly Bell presents for f/up- She complains of a 2-year history of worsening bilateral knee pain.  It sounds like she had Synvisc injections done 2 years ago with orthopedics.  She says the pain interferes with her sleep and activity.  The pain is worse on the left than the right.  She gets some and brief relief with nabumetone.  Outpatient Medications Prior to Visit  Medication Sig Dispense Refill   ACCU-CHEK AVIVA PLUS test strip USE TO CHECK BLOOD SUGAR ONCE DAILY. Dx: E11.9 100 each 0   aspirin 325 MG tablet Take 325 mg by mouth daily.       Blood Glucose Monitoring Suppl (ACCU-CHEK AVIVA PLUS) w/Device KIT USE AS DIRECTED TO CHECK GLUCOSE 1 kit 0   Calcium Carbonate-Vitamin D (CALTRATE 600+D) 600-400 MG-UNIT per tablet Take 1 tablet by mouth 2 (two) times daily.     ezetimibe (ZETIA) 10 MG tablet Take 1 tablet (10 mg total) by mouth daily. 90 tablet 3   metoprolol succinate (TOPROL-XL) 25 MG 24 hr tablet Take 1 tablet (25 mg total) by mouth daily. 90 tablet 3   nabumetone (RELAFEN) 500 MG tablet TAKE 1 TABLET BY MOUTH TWICE DAILY AS NEEDED AFTER A MEAL FOR ARTHRITIS 60 tablet 0   oxybutynin (DITROPAN) 5 MG tablet Take 1 tablet (5 mg total) by mouth 4 (four) times daily. 120 tablet 5   fluticasone (FLONASE) 50 MCG/ACT nasal spray USE TWO SPRAY IN EACH NOSTRIL ONCE DAILY 48 g 1   No facility-administered medications prior to visit.    ROS Review of Systems  Constitutional: Negative.  Negative for chills,  diaphoresis, fatigue and fever.  HENT: Positive for congestion. Negative for sinus pressure.   Eyes: Negative.   Respiratory: Negative for cough, chest tightness, shortness of breath and wheezing.   Cardiovascular: Negative for chest pain, palpitations and leg swelling.  Gastrointestinal: Negative for abdominal pain, blood in stool, constipation, diarrhea and vomiting.  Endocrine: Negative.   Genitourinary: Negative.  Negative for difficulty urinating.  Musculoskeletal: Positive for arthralgias. Negative for myalgias and neck pain.  Skin: Negative for color change, pallor and rash.  Neurological: Negative.  Negative for dizziness, weakness and light-headedness.  Hematological: Negative for adenopathy. Does not bruise/bleed easily.  Psychiatric/Behavioral: Negative.     Objective:  BP 130/80 (BP Location: Left Arm, Patient Position: Sitting, Cuff Size: Large)    Pulse (!) 53    Temp 98.1 F (36.7 C) (Oral)    Resp 16    Ht 5' 4"  (1.626 m)    Wt 224 lb (101.6 kg)    SpO2 99%    BMI 38.45 kg/m   BP Readings from Last 3 Encounters:  07/07/20 130/80  06/03/20 (!) 160/80  10/29/19 140/90    Wt Readings from Last 3 Encounters:  07/07/20 224 lb (101.6 kg)  06/03/20 224 lb (101.6 kg)  10/29/19 225 lb (102.1 kg)    Physical Exam Vitals reviewed.  Constitutional:      Appearance: She is obese.  HENT:     Nose: Nose  normal.     Mouth/Throat:     Mouth: Mucous membranes are moist.  Eyes:     General: No scleral icterus.    Conjunctiva/sclera: Conjunctivae normal.  Cardiovascular:     Rate and Rhythm: Normal rate and regular rhythm.     Heart sounds: No murmur heard.   Pulmonary:     Effort: Pulmonary effort is normal.     Breath sounds: No stridor. No wheezing, rhonchi or rales.  Abdominal:     General: Abdomen is protuberant. Bowel sounds are normal. There is no distension.     Palpations: Abdomen is soft. There is no hepatomegaly, splenomegaly or mass.  Musculoskeletal:         General: Normal range of motion.     Cervical back: Neck supple.     Right knee: Deformity (DJD) present. No swelling or bony tenderness. No tenderness.     Left knee: Deformity (DJD) present. No swelling or bony tenderness. No tenderness.     Right lower leg: No edema.     Left lower leg: No edema.  Lymphadenopathy:     Cervical: No cervical adenopathy.  Skin:    General: Skin is warm and dry.     Coloration: Skin is not pale.  Neurological:     General: No focal deficit present.     Mental Status: She is alert.  Psychiatric:        Mood and Affect: Mood normal.        Behavior: Behavior normal.     Lab Results  Component Value Date   WBC 3.9 06/03/2020   HGB 14.4 06/03/2020   HCT 42.7 06/03/2020   PLT 197 06/03/2020   GLUCOSE 126 (H) 06/03/2020   CHOL 215 (H) 06/03/2020   TRIG 123 06/03/2020   HDL 46 (L) 06/03/2020   LDLDIRECT 111.5 01/01/2007   LDLCALC 144 (H) 06/03/2020   ALT 16 06/03/2020   AST 16 06/03/2020   NA 140 06/03/2020   K 4.3 06/03/2020   CL 106 06/03/2020   CREATININE 0.79 06/03/2020   BUN 13 06/03/2020   CO2 28 06/03/2020   TSH 1.29 06/03/2020   HGBA1C 6.1 (H) 06/03/2020   MICROALBUR 1.1 06/03/2020    No results found.  Assessment & Plan:   Kelly Bell was seen today for osteoarthritis, hypertension, diabetes and allergic rhinitis .  Diagnoses and all orders for this visit:  Seasonal allergic rhinitis due to pollen -     fluticasone (FLONASE) 50 MCG/ACT nasal spray; USE TWO SPRAY IN EACH NOSTRIL ONCE DAILY  Essential hypertension- Her blood pressure is adequately well controlled.  Primary osteoarthritis of both knees- She has pain that limits her ADLs and sleep.  She is not interested in having a surgical remedy.  Will continue the current dose of nabumetone and will add hydrocodone and acetaminophen for pain relief. -     HYDROcodone-acetaminophen (NORCO/VICODIN) 5-325 MG tablet; Take 1 tablet by mouth every 6 (six) hours as needed for  moderate pain.  Need for pneumococcal vaccination -     Pneumococcal polysaccharide vaccine 23-valent greater than or equal to 2yo subcutaneous/IM   I am having Kelly Bell start on HYDROcodone-acetaminophen. I am also having her maintain her aspirin, Calcium Carbonate-Vitamin D, Accu-Chek Aviva Plus, Accu-Chek Aviva Plus, ezetimibe, metoprolol succinate, oxybutynin, nabumetone, and fluticasone.  Meds ordered this encounter  Medications   fluticasone (FLONASE) 50 MCG/ACT nasal spray    Sig: USE TWO SPRAY IN EACH NOSTRIL ONCE DAILY  Dispense:  48 g    Refill:  1   HYDROcodone-acetaminophen (NORCO/VICODIN) 5-325 MG tablet    Sig: Take 1 tablet by mouth every 6 (six) hours as needed for moderate pain.    Dispense:  65 tablet    Refill:  0     Follow-up: Return if symptoms worsen or fail to improve.  Scarlette Calico, MD

## 2020-07-07 NOTE — Patient Instructions (Signed)

## 2020-07-09 ENCOUNTER — Telehealth: Payer: Self-pay

## 2020-07-09 NOTE — Telephone Encounter (Signed)
New message    Pt c/o medication issue:  1. Name of Medication: HYDROcodone-acetaminophen (NORCO/VICODIN) 5-325 MG tablet  2. How are you currently taking this medication (dosage and times per day)? New medication   3. Are you having a reaction (difficulty breathing--STAT)? On   4. What is your medication issue?  Pharmacy can only filled for 5 this is the first time patients has receidved this medicaiton

## 2020-07-09 NOTE — Telephone Encounter (Signed)
Key: [id number]

## 2020-07-10 NOTE — Telephone Encounter (Signed)
Tried to call pt to ask what was it that she was needing when she had called into the office as telephone note was not clear.  **Pts phone just rang.

## 2020-09-15 ENCOUNTER — Other Ambulatory Visit: Payer: Self-pay | Admitting: Internal Medicine

## 2020-11-22 ENCOUNTER — Other Ambulatory Visit: Payer: Self-pay | Admitting: Internal Medicine

## 2020-11-22 NOTE — Telephone Encounter (Signed)
Please refill as per office routine med refill policy (all routine meds refilled for 3 mo or monthly per pt preference up to one year from last visit, then month to month grace period for 3 mo, then further med refills will have to be denied)  

## 2020-12-08 ENCOUNTER — Ambulatory Visit: Payer: Medicare PPO | Admitting: Internal Medicine

## 2020-12-29 ENCOUNTER — Other Ambulatory Visit: Payer: Self-pay

## 2020-12-30 ENCOUNTER — Encounter: Payer: Self-pay | Admitting: Internal Medicine

## 2020-12-30 ENCOUNTER — Ambulatory Visit (INDEPENDENT_AMBULATORY_CARE_PROVIDER_SITE_OTHER): Payer: Medicare PPO | Admitting: Internal Medicine

## 2020-12-30 VITALS — BP 126/80 | HR 63 | Temp 98.4°F | Ht 64.0 in | Wt 222.0 lb

## 2020-12-30 DIAGNOSIS — E119 Type 2 diabetes mellitus without complications: Secondary | ICD-10-CM

## 2020-12-30 DIAGNOSIS — E7800 Pure hypercholesterolemia, unspecified: Secondary | ICD-10-CM | POA: Diagnosis not present

## 2020-12-30 DIAGNOSIS — M25561 Pain in right knee: Secondary | ICD-10-CM

## 2020-12-30 DIAGNOSIS — T466X5A Adverse effect of antihyperlipidemic and antiarteriosclerotic drugs, initial encounter: Secondary | ICD-10-CM

## 2020-12-30 DIAGNOSIS — I10 Essential (primary) hypertension: Secondary | ICD-10-CM

## 2020-12-30 DIAGNOSIS — G720 Drug-induced myopathy: Secondary | ICD-10-CM

## 2020-12-30 DIAGNOSIS — E538 Deficiency of other specified B group vitamins: Secondary | ICD-10-CM | POA: Diagnosis not present

## 2020-12-30 DIAGNOSIS — E559 Vitamin D deficiency, unspecified: Secondary | ICD-10-CM

## 2020-12-30 DIAGNOSIS — G8929 Other chronic pain: Secondary | ICD-10-CM

## 2020-12-30 DIAGNOSIS — Z23 Encounter for immunization: Secondary | ICD-10-CM | POA: Diagnosis not present

## 2020-12-30 DIAGNOSIS — Z0000 Encounter for general adult medical examination without abnormal findings: Secondary | ICD-10-CM | POA: Diagnosis not present

## 2020-12-30 DIAGNOSIS — Z0001 Encounter for general adult medical examination with abnormal findings: Secondary | ICD-10-CM

## 2020-12-30 LAB — HEPATIC FUNCTION PANEL
ALT: 13 U/L (ref 0–35)
AST: 14 U/L (ref 0–37)
Albumin: 4.4 g/dL (ref 3.5–5.2)
Alkaline Phosphatase: 53 U/L (ref 39–117)
Bilirubin, Direct: 0.1 mg/dL (ref 0.0–0.3)
Total Bilirubin: 0.8 mg/dL (ref 0.2–1.2)
Total Protein: 7.1 g/dL (ref 6.0–8.3)

## 2020-12-30 LAB — CBC WITH DIFFERENTIAL/PLATELET
Basophils Absolute: 0 10*3/uL (ref 0.0–0.1)
Basophils Relative: 0.4 % (ref 0.0–3.0)
Eosinophils Absolute: 0.1 10*3/uL (ref 0.0–0.7)
Eosinophils Relative: 1.6 % (ref 0.0–5.0)
HCT: 42.9 % (ref 36.0–46.0)
Hemoglobin: 14.6 g/dL (ref 12.0–15.0)
Lymphocytes Relative: 41.9 % (ref 12.0–46.0)
Lymphs Abs: 1.9 10*3/uL (ref 0.7–4.0)
MCHC: 34 g/dL (ref 30.0–36.0)
MCV: 91.2 fl (ref 78.0–100.0)
Monocytes Absolute: 0.3 10*3/uL (ref 0.1–1.0)
Monocytes Relative: 5.7 % (ref 3.0–12.0)
Neutro Abs: 2.3 10*3/uL (ref 1.4–7.7)
Neutrophils Relative %: 50.4 % (ref 43.0–77.0)
Platelets: 186 10*3/uL (ref 150.0–400.0)
RBC: 4.71 Mil/uL (ref 3.87–5.11)
RDW: 12.6 % (ref 11.5–15.5)
WBC: 4.6 10*3/uL (ref 4.0–10.5)

## 2020-12-30 LAB — VITAMIN B12: Vitamin B-12: >1506 pg/mL — ABNORMAL HIGH (ref 211–911)

## 2020-12-30 LAB — URINALYSIS, ROUTINE W REFLEX MICROSCOPIC
Bilirubin Urine: NEGATIVE
Hgb urine dipstick: NEGATIVE
Ketones, ur: NEGATIVE
Leukocytes,Ua: NEGATIVE
Nitrite: NEGATIVE
RBC / HPF: NONE SEEN (ref 0–?)
Specific Gravity, Urine: 1.01 (ref 1.000–1.030)
Total Protein, Urine: NEGATIVE
Urine Glucose: NEGATIVE
Urobilinogen, UA: 0.2 (ref 0.0–1.0)
pH: 7 (ref 5.0–8.0)

## 2020-12-30 LAB — LIPID PANEL
Cholesterol: 213 mg/dL — ABNORMAL HIGH (ref 0–200)
HDL: 43.9 mg/dL (ref >39.00)
LDL Cholesterol: 136 mg/dL — ABNORMAL HIGH (ref 0–99)
NonHDL: 169.09
Total CHOL/HDL Ratio: 5
Triglycerides: 167 mg/dL — ABNORMAL HIGH (ref 0.0–149.0)
VLDL: 33.4 mg/dL (ref 0.0–40.0)

## 2020-12-30 LAB — MICROALBUMIN / CREATININE URINE RATIO
Creatinine,U: 73.5 mg/dL
Microalb Creat Ratio: 1.8 mg/g (ref 0.0–30.0)
Microalb, Ur: 1.3 mg/dL (ref 0.0–1.9)

## 2020-12-30 LAB — HEMOGLOBIN A1C: Hgb A1c MFr Bld: 6.2 % (ref 4.6–6.5)

## 2020-12-30 LAB — BASIC METABOLIC PANEL
BUN: 12 mg/dL (ref 6–23)
CO2: 30 mEq/L (ref 19–32)
Calcium: 10.5 mg/dL (ref 8.4–10.5)
Chloride: 103 mEq/L (ref 96–112)
Creatinine, Ser: 0.81 mg/dL (ref 0.40–1.20)
GFR: 68.3 mL/min (ref >60.00)
Glucose, Bld: 97 mg/dL (ref 70–99)
Potassium: 4.3 mEq/L (ref 3.5–5.1)
Sodium: 140 mEq/L (ref 135–145)

## 2020-12-30 LAB — TSH: TSH: 1.27 u[IU]/mL (ref 0.35–4.50)

## 2020-12-30 LAB — VITAMIN D 25 HYDROXY (VIT D DEFICIENCY, FRACTURES): VITD: 26.64 ng/mL — ABNORMAL LOW (ref 30.00–100.00)

## 2020-12-30 NOTE — Patient Instructions (Signed)
You had the flu shot today  Please continue all other medications as before, even if you can only take the statin on Wed and Friday each week  Ok to take 2 of the 2000 unit Vit D3 (for total 4000 units per day)  Please have the pharmacy call with any other refills you may need.  Please continue your efforts at being more active, low cholesterol diet, and weight control.  You are otherwise up to date with prevention measures today.  Please keep your appointments with your specialists as you may have planned  Please go to the LAB at the blood drawing area for the tests to be done  You will be contacted by phone if any changes need to be made immediately.  Otherwise, you will receive a letter about your results with an explanation, but please check with MyChart first.  Please remember to sign up for MyChart if you have not done so, as this will be important to you in the future with finding out test results, communicating by private email, and scheduling acute appointments online when needed.  Please make an Appointment to return in 6 months, or sooner if needed

## 2020-12-30 NOTE — Progress Notes (Signed)
Patient ID: Kelly Bell, female   DOB: 04/20/1941, 80 y.o.   MRN: [id number]         Chief Complaint:: wellness exam and Follow-up  DM,. HLD, low vit D, and statin myopathy       HPI:  Kelly Bell is a 80 y.o. female here for wellness exam, due for flu shot, o/w up to date with immunizations and preventive referrals.                         Also has ongoing right knee pain without swelling or falls; mild,intermittent, worse to walk, better to sit, sometimes associated with swelling but no giveaways or falls.  Already taking vit d at 2000 u qd,  Has been only able to take her statin on Wed and Friday only due to increased muscle and joint pains, but willing to continue this regimen.   Pt denies polydipsia, polyuria, Pt denies chest pain, increased sob or doe, wheezing, orthopnea, PND, increased LE swelling, palpitations, dizziness or syncope.   Pt denies fever, wt loss, night sweats, loss of appetite, or other constitutional symptoms  No other new complaints  Wt Readings from Last 3 Encounters:  12/30/20 222 lb (100.7 kg)  07/07/20 224 lb (101.6 kg)  06/03/20 224 lb (101.6 kg)   BP Readings from Last 3 Encounters:  12/30/20 126/80  07/07/20 130/80  06/03/20 (!) 160/80   Immunization History  Administered Date(s) Administered  . Fluad Quad(high Dose 65+) 08/12/2019, 12/30/2020  . Influenza Split 08/03/2011, 09/13/2012  . Influenza Whole 08/12/2008, 08/12/2009, 08/19/2010  . Influenza, High Dose Seasonal PF 09/05/2013, 09/07/2016, 09/06/2017, 09/28/2018  . Influenza,inj,Quad PF,6+ Mos 08/18/2014, 08/07/2015  . PFIZER(Purple Top)SARS-COV-2 Vaccination 11/15/2019, 12/12/2019, 08/26/2020  . Pneumococcal Conjugate-13 08/18/2014  . Pneumococcal Polysaccharide-23 09/10/2007, 07/07/2020  . Td 10/21/2008  . Tdap 05/09/2019  . Zoster 02/04/2010   There are no preventive care reminders to display for this patient.    Past Medical History:  Diagnosis Date  . Allergy   . Arthritis    knees   . Breast cancer ([place])    Right  . HTN (hypertension)   . Hyperlipidemia   . Hypertension   . Irritable bladder   . Knee pain    Bilateral  . SVD (spontaneous vaginal delivery)    x 2  . Type II or unspecified type diabetes mellitus without mention of complication, not stated as uncontrolled    Past Surgical History:  Procedure Laterality Date  . ABDOMINAL HYSTERECTOMY  1975  . COLONOSCOPY  05/10/05, 2011   hx polyps  . MASTECTOMY PARTIAL / LUMPECTOMY W/ AXILLARY LYMPHADENECTOMY  07/1986  . MASTECTOMY W/ SENTINEL NODE BIOPSY Left 10/28/2015   Procedure: LEFT MASTECTOMY WITH SENTINEL LYMPH NODE BIOPSY;  Surgeon: Stark Klein, MD;  Location: [facility];  Service: General;  Laterality: Left;  . SHOULDER ARTHROSCOPY  2000    reports that she has never smoked. She has never used smokeless tobacco. She reports current alcohol use. She reports that she does not use drugs. family history includes Breast cancer in her sister; Breast cancer (age of onset: 47) in her sister; Cancer in her brother and cousin; Corneal Dystrophy in her son; Diabetes (age of onset: 73) in her father and mother; Esophageal cancer in her maternal grandfather; Healthy in her daughter; Heart disease in her father; Heart failure in her father; Pancreatic cancer in her mother; Rectal cancer in her maternal grandfather; Stomach cancer in  her maternal grandfather. Allergies  Allergen Reactions  . Hydrocodone-Acetaminophen Other (See Comments)    Mental confusion  . Metformin And Related Diarrhea  . Pravastatin Other (See Comments)    Severe muscle cramps.  . Lisinopril     cough   Current Outpatient Medications on File Prior to Visit  Medication Sig Dispense Refill  . ACCU-CHEK AVIVA PLUS test strip USE TO CHECK BLOOD SUGAR ONCE DAILY. Dx: E11.9 100 each 0  . aspirin 325 MG tablet Take 325 mg by mouth daily.    . Blood Glucose Monitoring Suppl (ACCU-CHEK AVIVA PLUS) w/Device KIT USE AS DIRECTED TO CHECK  GLUCOSE 1 kit 0  . Calcium Carbonate-Vitamin D 600-400 MG-UNIT tablet Take 1 tablet by mouth 2 (two) times daily.    Marland Kitchen ezetimibe (ZETIA) 10 MG tablet Take 1 tablet (10 mg total) by mouth daily. 90 tablet 3  . fluticasone (FLONASE) 50 MCG/ACT nasal spray USE TWO SPRAY IN EACH NOSTRIL ONCE DAILY 48 g 1  . metoprolol succinate (TOPROL-XL) 25 MG 24 hr tablet Take 1 tablet by mouth once daily 90 tablet 1  . nabumetone (RELAFEN) 500 MG tablet TAKE 1 TABLET BY MOUTH TWICE DAILY AS NEEDED AFTER A MEAL FOR ARTHRITIS 60 tablet 0  . oxybutynin (DITROPAN) 5 MG tablet Take 1 tablet (5 mg total) by mouth 4 (four) times daily. 120 tablet 5   No current facility-administered medications on file prior to visit.        ROS:  All others reviewed and negative.  Objective        PE:  BP 126/80   Pulse 63   Temp 98.4 F (36.9 C) (Oral)   Ht 5' 4"  (1.626 m)   Wt 222 lb (100.7 kg)   SpO2 97%   BMI 38.11 kg/m                 Constitutional: Pt appears in NAD               HENT: Head: NCAT.                Right Ear: External ear normal.                 Left Ear: External ear normal.                Eyes: . Pupils are equal, round, and reactive to light. Conjunctivae and EOM are normal               Nose: without d/c or deformity               Neck: Neck supple. Gross normal ROM               Cardiovascular: Normal rate and regular rhythm.                 Pulmonary/Chest: Effort normal and breath sounds without rales or wheezing.                Abd:  Soft, NT, ND, + BS, no organomegaly               Neurological: Pt is alert. At baseline orientation, motor grossly intact               Skin: Skin is warm. No rashes, no other new lesions, LE edema - none               Right knee with bony degenerative changes, small  effusion  And reduce ROM               Psychiatric: Pt behavior is normal without agitation   Micro: none  Cardiac tracings I have personally interpreted today:  none  Pertinent Radiological  findings (summarize): none   Lab Results  Component Value Date   WBC 4.6 12/30/2020   HGB 14.6 12/30/2020   HCT 42.9 12/30/2020   PLT 186.0 12/30/2020   GLUCOSE 97 12/30/2020   CHOL 213 (H) 12/30/2020   TRIG 167.0 (H) 12/30/2020   HDL 43.90 12/30/2020   LDLDIRECT 111.5 01/01/2007   LDLCALC 136 (H) 12/30/2020   ALT 13 12/30/2020   AST 14 12/30/2020   NA 140 12/30/2020   K 4.3 12/30/2020   CL 103 12/30/2020   CREATININE 0.81 12/30/2020   BUN 12 12/30/2020   CO2 30 12/30/2020   TSH 1.27 12/30/2020   HGBA1C 6.2 12/30/2020   MICROALBUR 1.3 12/30/2020   Assessment/Plan:  MOOREA BOISSONNEAULT is a 80 y.o. Black or African American [2] female with  has a past medical history of Allergy, Arthritis, Breast cancer ([place]), HTN (hypertension), Hyperlipidemia, Hypertension, Irritable bladder, Knee pain, SVD (spontaneous vaginal delivery), and Type II or unspecified type diabetes mellitus without mention of complication, not stated as uncontrolled.  Encounter for well adult exam with abnormal findings Age and sex appropriate education and counseling updated with regular exercise and diet Referrals for preventative services - none needed Immunizations addressed - for flu shot today Smoking counseling  - none needed Evidence for depression or other mood disorder - none significant Most recent labs reviewed. I have personally reviewed and have noted: 1) the patient's medical and social history 2) The patient's current medications and supplements 3) The patient's height, weight, and BMI have been recorded in the chart   Diabetes type 2, controlled Lab Results  Component Value Date   HGBA1C 6.2 12/30/2020   Stable, pt to continue current medical treatment  - diet and wt control   Essential hypertension BP Readings from Last 3 Encounters:  12/30/20 126/80  07/07/20 130/80  06/03/20 (!) 160/80   Stable, pt to continue medical treatment  - toprol   Current Outpatient Medications  (Cardiovascular):  .  ezetimibe (ZETIA) 10 MG tablet, Take 1 tablet (10 mg total) by mouth daily. .  metoprolol succinate (TOPROL-XL) 25 MG 24 hr tablet, Take 1 tablet by mouth once daily  Current Outpatient Medications (Respiratory):  .  fluticasone (FLONASE) 50 MCG/ACT nasal spray, USE TWO SPRAY IN EACH NOSTRIL ONCE DAILY  Current Outpatient Medications (Analgesics):  .  aspirin 325 MG tablet, Take 325 mg by mouth daily. .  nabumetone (RELAFEN) 500 MG tablet, TAKE 1 TABLET BY MOUTH TWICE DAILY AS NEEDED AFTER A MEAL FOR ARTHRITIS   Current Outpatient Medications (Other):  Marland Kitchen  ACCU-CHEK AVIVA PLUS test strip, USE TO CHECK BLOOD SUGAR ONCE DAILY. Dx: E11.9 .  Blood Glucose Monitoring Suppl (ACCU-CHEK AVIVA PLUS) w/Device KIT, USE AS DIRECTED TO CHECK GLUCOSE .  Calcium Carbonate-Vitamin D 600-400 MG-UNIT tablet, Take 1 tablet by mouth 2 (two) times daily. Marland Kitchen  oxybutynin (DITROPAN) 5 MG tablet, Take 1 tablet (5 mg total) by mouth 4 (four) times daily.   Hyperlipidemia Currently only able to take her statin every wed and Friday, to continue and low chol diet Lab Results  Component Value Date   CHOL 213 (H) 12/30/2020   CHOL 215 (H) 06/03/2020   CHOL 184 05/09/2019   Lab Results  Component Value Date   HDL 43.90 12/30/2020   HDL 46 (L) 06/03/2020   HDL 45.40 05/09/2019   Lab Results  Component Value Date   LDLCALC 136 (H) 12/30/2020   LDLCALC 144 (H) 06/03/2020   LDLCALC 106 (H) 05/09/2019   Lab Results  Component Value Date   TRIG 167.0 (H) 12/30/2020   TRIG 123 06/03/2020   TRIG 161.0 (H) 05/09/2019   Lab Results  Component Value Date   CHOLHDL 5 12/30/2020   CHOLHDL 4.7 06/03/2020   CHOLHDL 4 05/09/2019   Lab Results  Component Value Date   LDLDIRECT 111.5 01/01/2007     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  Cathlean Cower, MD 01/03/2021 2:19 AM [facility]

## 2021-01-03 ENCOUNTER — Encounter: Payer: Self-pay | Admitting: Internal Medicine

## 2021-01-03 DIAGNOSIS — T466X5A Adverse effect of antihyperlipidemic and antiarteriosclerotic drugs, initial encounter: Secondary | ICD-10-CM | POA: Insufficient documentation

## 2021-01-03 DIAGNOSIS — M25561 Pain in right knee: Secondary | ICD-10-CM | POA: Insufficient documentation

## 2021-01-03 DIAGNOSIS — G720 Drug-induced myopathy: Secondary | ICD-10-CM | POA: Insufficient documentation

## 2021-01-03 NOTE — Assessment & Plan Note (Signed)
Currently only able to take her statin every wed and Friday, to continue and low chol diet Lab Results  Component Value Date   CHOL 213 (H) 12/30/2020   CHOL 215 (H) 06/03/2020   CHOL 184 05/09/2019   Lab Results  Component Value Date   HDL 43.90 12/30/2020   HDL 46 (L) 06/03/2020   HDL 45.40 05/09/2019   Lab Results  Component Value Date   LDLCALC 136 (H) 12/30/2020   LDLCALC 144 (H) 06/03/2020   LDLCALC 106 (H) 05/09/2019   Lab Results  Component Value Date   TRIG 167.0 (H) 12/30/2020   TRIG 123 06/03/2020   TRIG 161.0 (H) 05/09/2019   Lab Results  Component Value Date   CHOLHDL 5 12/30/2020   CHOLHDL 4.7 06/03/2020   CHOLHDL 4 05/09/2019   Lab Results  Component Value Date   LDLDIRECT 111.5 01/01/2007

## 2021-01-03 NOTE — Assessment & Plan Note (Signed)
C/w mod to severe DJD, for tylenol prn, also encourage pt to f/u with sports medicine in this building for possible cortisone

## 2021-01-03 NOTE — Assessment & Plan Note (Signed)
To continue statin wed and Friday only,  to f/u any worsening symptoms or concerns

## 2021-01-03 NOTE — Assessment & Plan Note (Signed)
Lab Results  Component Value Date   HGBA1C 6.2 12/30/2020   Stable, pt to continue current medical treatment  - diet and wt control

## 2021-01-03 NOTE — Assessment & Plan Note (Signed)
Last vitamin D Lab Results  Component Value Date   VD25OH 26.64 (L) 12/30/2020   Still low, for increased oral replacement Vit D3 from 2000 to 4000 u qd

## 2021-01-03 NOTE — Assessment & Plan Note (Signed)
BP Readings from Last 3 Encounters:  12/30/20 126/80  07/07/20 130/80  06/03/20 (!) 160/80   Stable, pt to continue medical treatment  - toprol   Current Outpatient Medications (Cardiovascular):  .  ezetimibe (ZETIA) 10 MG tablet, Take 1 tablet (10 mg total) by mouth daily. .  metoprolol succinate (TOPROL-XL) 25 MG 24 hr tablet, Take 1 tablet by mouth once daily  Current Outpatient Medications (Respiratory):  .  fluticasone (FLONASE) 50 MCG/ACT nasal spray, USE TWO SPRAY IN EACH NOSTRIL ONCE DAILY  Current Outpatient Medications (Analgesics):  .  aspirin 325 MG tablet, Take 325 mg by mouth daily. .  nabumetone (RELAFEN) 500 MG tablet, TAKE 1 TABLET BY MOUTH TWICE DAILY AS NEEDED AFTER A MEAL FOR ARTHRITIS   Current Outpatient Medications (Other):  Marland Kitchen  ACCU-CHEK AVIVA PLUS test strip, USE TO CHECK BLOOD SUGAR ONCE DAILY. Dx: E11.9 .  Blood Glucose Monitoring Suppl (ACCU-CHEK AVIVA PLUS) w/Device KIT, USE AS DIRECTED TO CHECK GLUCOSE .  Calcium Carbonate-Vitamin D 600-400 MG-UNIT tablet, Take 1 tablet by mouth 2 (two) times daily. Marland Kitchen  oxybutynin (DITROPAN) 5 MG tablet, Take 1 tablet (5 mg total) by mouth 4 (four) times daily.

## 2021-01-03 NOTE — Assessment & Plan Note (Signed)
Age and sex appropriate education and counseling updated with regular exercise and diet Referrals for preventative services - none needed Immunizations addressed - for flu shot today Smoking counseling  - none needed Evidence for depression or other mood disorder - none significant Most recent labs reviewed. I have personally reviewed and have noted: 1) the patient's medical and social history 2) The patient's current medications and supplements 3) The patient's height, weight, and BMI have been recorded in the chart  

## 2021-01-05 ENCOUNTER — Other Ambulatory Visit: Payer: Self-pay | Admitting: Internal Medicine

## 2021-01-05 NOTE — Telephone Encounter (Signed)
Please refill as per office routine med refill policy (all routine meds refilled for 3 mo or monthly per pt preference up to one year from last visit, then month to month grace period for 3 mo, then further med refills will have to be denied)  

## 2021-03-01 ENCOUNTER — Other Ambulatory Visit: Payer: Self-pay | Admitting: Internal Medicine

## 2021-03-01 NOTE — Telephone Encounter (Signed)
Please refill as per office routine med refill policy (all routine meds refilled for 3 mo or monthly per pt preference up to one year from last visit, then month to month grace period for 3 mo, then further med refills will have to be denied)  

## 2021-04-21 ENCOUNTER — Encounter: Payer: Self-pay | Admitting: Orthopaedic Surgery

## 2021-04-21 ENCOUNTER — Ambulatory Visit (INDEPENDENT_AMBULATORY_CARE_PROVIDER_SITE_OTHER): Payer: Medicare PPO

## 2021-04-21 ENCOUNTER — Ambulatory Visit (INDEPENDENT_AMBULATORY_CARE_PROVIDER_SITE_OTHER): Payer: Medicare PPO | Admitting: Orthopaedic Surgery

## 2021-04-21 VITALS — BP 179/79 | HR 66 | Ht 65.0 in

## 2021-04-21 DIAGNOSIS — M25571 Pain in right ankle and joints of right foot: Secondary | ICD-10-CM | POA: Diagnosis not present

## 2021-04-21 DIAGNOSIS — M76821 Posterior tibial tendinitis, right leg: Secondary | ICD-10-CM

## 2021-04-21 DIAGNOSIS — M25561 Pain in right knee: Secondary | ICD-10-CM | POA: Diagnosis not present

## 2021-04-21 DIAGNOSIS — G8929 Other chronic pain: Secondary | ICD-10-CM

## 2021-04-21 NOTE — Progress Notes (Signed)
Office Visit Note   Patient: Kelly Bell           Date of Birth: [birth year]           MRN: [id number] Visit Date: 04/21/2021              Requested by: Biagio Borg, MD [place],  [place] [id number] PCP: Biagio Borg, MD   Assessment & Plan: Visit Diagnoses:  1. Chronic pain of right knee   2. Pain in right ankle and joints of right foot   3. Posterior tibial tendinitis, right leg     Plan: Cam boot.  Posterior tibial tendinopathy discussed.  She does have posterior tibial function may have some either tenosynovitis or partial tearing.  Cam boot, Aspercreme, continue meloxicam.  Recheck 1 month.  If she is having persistent symptoms we may need to consider imaging of her foot and ankle for posterior tibial tendinopathy.  Follow-Up Instructions: Return in about 1 month (around 05/21/2021).   Orders:  Orders Placed This Encounter  Procedures  . XR Ankle Complete Right  . XR KNEE 3 VIEW RIGHT   No orders of the defined types were placed in this encounter.     Procedures: No procedures performed   Clinical Data: No additional findings.   Subjective: Chief Complaint  Patient presents with  . Right Ankle - Pain  . Right Knee - Pain    HPI 80 year old female seen with right medial ankle pain, limping difficulty ambulating.  She has had some knee symptoms primarily medial and also problems with going up and down stairs.  She is taken Relafen with slight improvement.  She has significant pain in the medial foot and ankle near the heel when she stands and tries to walk.  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  She denies neurogenic claudication symptoms.  No history of gout no chills or fever.  She has had breast cancer both right and left breast years ago.  Left mastectomy right side was lumpectomy and radiation treatment.  Negative lymph nodes she relates.  Review of Systems all other systems noncontributory to  HPI.   Objective: Vital Signs: BP (!) 179/79   Pulse 66   Ht 5\' 5"  (1.651 m)   BMI 36.94 kg/m   Physical Exam Constitutional:      Appearance: She is well-developed.  HENT:     Head: Normocephalic.     Right Ear: External ear normal.     Left Ear: External ear normal.  Eyes:     Pupils: Pupils are equal, round, and reactive to light.  Neck:     Thyroid: No thyromegaly.     Trachea: No tracheal deviation.  Cardiovascular:     Rate and Rhythm: Normal rate.  Pulmonary:     Effort: Pulmonary effort is normal.  Abdominal:     Palpations: Abdomen is soft.  Skin:    General: Skin is warm and dry.  Neurological:     Mental Status: She is alert and oriented to person, place, and time.  Psychiatric:        Behavior: Behavior normal.     Ortho Exam patient has significant tenderness with some pitting edema swelling of the posterior tibial tendon sheath.  Posterior tibial low resistance is present but painful.  FHL FVC is normal.  Minimal tenderness along the anterior ankle joint.  Lateral tendons are normal negative anterior drawer.  Plantar  fascial Achilles tendon is normal to palpation.  No plantar foot lesions.  She is amatory with the right foot limp but no right foot hindfoot valgus.  Specialty Comments:  No specialty comments available.  Imaging: XR Ankle Complete Right  Result Date: 04/21/2021 AP lateral mortise right ankle obtained and reviewed.  This shows reduced ankle mortise without degenerative changes.  No soft tissue calcification.  negative for stress fracture.  Some calcific tendinopathy of the Achilles tendon.  Insertion site. Impression: Right ankle negative for degenerative or acute changes.  XR KNEE 3 VIEW RIGHT  Result Date: 04/21/2021 Standing AP both knees lateral right knee bilateral sunrise patella x-ray obtained and reviewed.  This shows patellofemoral degenerative spurring with some medial joint line mild narrowing and spurring. Impression: Right and  left knee mild osteoarthritis with moderate patellofemoral degenerative changes.    PMFS History: Patient Active Problem List   Diagnosis Date Noted  . Posterior tibial tendinitis, right leg 04/21/2021  . Statin myopathy 01/03/2021  . Knee pain, right 01/03/2021  . Osteoarthritis of both knees 07/07/2020  . Heart murmur 06/03/2020  . Vitamin D deficiency 10/29/2019  . Grief 10/29/2019  . Overactive bladder 03/09/2016  . Family history of breast cancer 09/22/2015  . Malignant neoplasm of upper-inner quadrant of left breast in female, estrogen receptor positive ([place]) 09/09/2015  . 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 Bradycardia 09/17/2014  . Edema 03/05/2013  . Obesity, Class II, BMI 35-39.9 06/23/2012  . Encounter for well adult exam with abnormal findings 06/23/2012  . ALLERGIC RHINITIS 08/12/2009  . Diabetes type 2, controlled ([place]) 10/01/2007  . Hyperlipidemia 09/13/2007  . Essential hypertension 09/13/2007  . BREAST CANCER, HX OF 09/13/2007   Past Medical History:  Diagnosis Date  . Allergy   . Arthritis    knees  . Breast cancer ([place])    Right  . HTN (hypertension)   . Hyperlipidemia   . Hypertension   . Irritable bladder   . Knee pain    Bilateral  . SVD (spontaneous vaginal delivery)    x 2  . Type II or unspecified type diabetes mellitus without mention of complication, not stated as uncontrolled     Family History  Problem Relation Age of Onset  . Diabetes Mother 65       Deceased  . Pancreatic cancer Mother        dx. early 41s  . Diabetes Father 36       Deceased  . Heart failure Father   . Heart disease Father        CHF  . Breast cancer Sister 57  . Cancer Brother        oral cancer - metastasis to lungs; smoker  . Corneal Dystrophy Son        bilateral  . Healthy Daughter        x1  . Breast cancer Sister        dx. 44s  . Esophageal cancer Maternal Grandfather   . Rectal cancer Maternal Grandfather   . Stomach cancer Maternal Grandfather   .  Cancer Cousin        oral cancer dx. <60; +EtOH, smoker; daughter of father's paternal half-brother    Past Surgical History:  Procedure Laterality Date  . ABDOMINAL HYSTERECTOMY  1975  . COLONOSCOPY  05/10/05, 2011   hx polyps  . MASTECTOMY PARTIAL / LUMPECTOMY W/ AXILLARY LYMPHADENECTOMY  07/1986  . MASTECTOMY W/ SENTINEL NODE BIOPSY Left 10/28/2015   Procedure: LEFT MASTECTOMY  WITH SENTINEL LYMPH NODE BIOPSY;  Surgeon: Stark Klein, MD;  Location: [facility];  Service: General;  Laterality: Left;  . SHOULDER ARTHROSCOPY  2000   Social History   Occupational History  . 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 Smoking status: Never Smoker  . Smokeless tobacco: Never Used  Vaping Use  . Vaping Use: Never used  Substance and Sexual Activity  . Alcohol use: Yes    Comment: occasional wine  . Drug use: No  . Sexual activity: Not Currently    Birth control/protection: Post-menopausal    Comment: Hysterectomy

## 2021-05-25 ENCOUNTER — Ambulatory Visit: Payer: Medicare PPO | Admitting: Orthopaedic Surgery

## 2021-05-28 ENCOUNTER — Telehealth: Payer: Self-pay | Admitting: *Deleted

## 2021-05-28 NOTE — Chronic Care Management (AMB) (Signed)
  Chronic Care Management   Note  05/28/2021 Name: Kelly Bell MRN: [id number] DOB: Oct 26, 1941  Kelly Bell is a 80 y.o. year old female who is a primary care patient of Biagio Borg, MD. I reached out to [facility] by phone today in response to a referral sent by [name]'s PCP Biagio Borg, MD     [name] was given information about Chronic Care Management services today including:  CCM service includes personalized support from designated clinical staff supervised by her physician, including individualized plan of care and coordination with other care providers 24/7 contact phone numbers for assistance for urgent and routine care needs. Service will only be billed when office clinical staff spend 20 minutes or more in a month to coordinate care. Only one practitioner may furnish and bill the service in a calendar month. The patient may stop CCM services at any time (effective at the end of the month) by phone call to the office staff. The patient will be responsible for cost sharing (co-pay) of up to 20% of the service fee (after annual deductible is met).  Patient agreed to services and verbal consent obtained.   Follow up plan: Telephone appointment with care management team member scheduled for:06/16/2021  Julian Hy, [place] [facility]  Direct Dial: [phone]

## 2021-06-02 ENCOUNTER — Other Ambulatory Visit: Payer: Self-pay | Admitting: Internal Medicine

## 2021-06-02 NOTE — Telephone Encounter (Signed)
Please refill as per office routine med refill policy (all routine meds refilled for 3 mo or monthly per pt preference up to one year from last visit, then month to month grace period for 3 mo, then further med refills will have to be denied)  

## 2021-06-16 ENCOUNTER — Ambulatory Visit (INDEPENDENT_AMBULATORY_CARE_PROVIDER_SITE_OTHER): Payer: Medicare PPO | Admitting: *Deleted

## 2021-06-16 DIAGNOSIS — E119 Type 2 diabetes mellitus without complications: Secondary | ICD-10-CM

## 2021-06-16 DIAGNOSIS — I10 Essential (primary) hypertension: Secondary | ICD-10-CM

## 2021-06-16 NOTE — Chronic Care Management (AMB) (Signed)
Chronic Care Management   CCM RN Visit Note  06/16/2021 Name: Kelly Bell MRN: [id number] DOB: 1941/11/02  Subjective: Kelly Bell is a 80 y.o. year old female who is a primary care patient of Biagio Borg, MD. The care management team was consulted for assistance with disease management and care coordination needs.    Engaged with patient by telephone for initial visit in response to provider referral for case management and/or care coordination services.   Consent to Services:  The patient was given information about Chronic Care Management services, agreed to services, and gave verbal consent 05/28/21 prior to initiation of services.  Please see initial visit note for detailed documentation.  Patient agreed to services and verbal consent obtained.   Assessment: Review of patient past medical history, allergies, medications, health status, including review of consultants reports, laboratory and other test data, was performed as part of comprehensive evaluation and provision of chronic care management services.   SDOH (Social Determinants of Health) assessments and interventions performed:  SDOH Interventions    Flowsheet Row Most Recent Value  SDOH Interventions   Food Insecurity Interventions Intervention Not Indicated  Housing Interventions Intervention Not Indicated  Transportation Interventions Intervention Not Indicated  [Patient drives self]       CCM Care Plan  Allergies  Allergen Reactions   Hydrocodone-Acetaminophen Other (See Comments)    Mental confusion   Metformin And Related Diarrhea   Pravastatin Other (See Comments)    Severe muscle cramps.   Lisinopril     cough   Outpatient Encounter Medications as of 06/16/2021  Medication Sig Note   ACCU-CHEK AVIVA PLUS test strip USE TO CHECK BLOOD SUGAR ONCE DAILY. Dx: E11.9    aspirin 325 MG tablet Take 325 mg by mouth daily.    Blood Glucose Monitoring Suppl (ACCU-CHEK AVIVA PLUS) w/Device KIT USE AS DIRECTED TO  CHECK GLUCOSE    Calcium Carbonate-Vitamin D 600-400 MG-UNIT tablet Take 1 tablet by mouth 2 (two) times daily.    fluticasone (FLONASE) 50 MCG/ACT nasal spray USE TWO SPRAY IN EACH NOSTRIL ONCE DAILY    metoprolol succinate (TOPROL-XL) 25 MG 24 hr tablet Take 1 tablet by mouth once daily    nabumetone (RELAFEN) 500 MG tablet TAKE 1 TABLET BY MOUTH TWICE DAILY AS NEEDED AFTER A MEAL FOR ARTHRITIS    oxybutynin (DITROPAN) 5 MG tablet Take 1 tablet by mouth 4 times daily    ezetimibe (ZETIA) 10 MG tablet Take 1 tablet (10 mg total) by mouth daily. (Patient not taking: Reported on 06/16/2021) 06/16/2021: 06/16/21: Patient reports does not tolerate side effects   No facility-administered encounter medications on file as of 06/16/2021.   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   Bradycardia 09/17/2014   Edema 03/05/2013   Obesity, Class II, BMI 35-39.9 06/23/2012   Encounter for well adult exam with abnormal findings 06/23/2012   ALLERGIC RHINITIS 08/12/2009   Diabetes type 2, controlled ([place]) 10/01/2007   Hyperlipidemia 09/13/2007   Essential hypertension 09/13/2007   BREAST CANCER, HX OF  09/13/2007   Conditions to be addressed/monitored:  HTN and DMII  Care Plan : RN Care Manager Plan of Care  Updates made by Knox Royalty, RN since 06/16/2021 12:00 AM     Problem: Chronic Disease Management Needs   Priority: Medium     Long-Range Goal: Development of plan of care for long term chronic disease management   Start Date: 06/16/2021   Expected End Date: 06/16/2022  Priority: Medium  Note:   Current Barriers:  Chronic Disease Management support and education needs related to HTN and DMII RNCM Clinical Goal(s):  Patient will demonstrate ongoing adherence to prescribed treatment plan for HTN and DMII as evidenced by adherence to ADA/ carb modified heart healthy diet; adherence to prescribed medication regimen; regular exercise/ activity; contacting provider for new or worsened symptoms or questions through collaboration with RN Care manager, provider, and care team.  Interventions: 1:1 collaboration with primary care provider regarding development and update of comprehensive plan of care as evidenced by provider attestation and co-signature Inter-disciplinary care team collaboration (see longitudinal plan of care) Evaluation of current treatment plan related to  self management and patient's adherence to plan as established by provider;  Diabetes:  (Status: New goal.) Lab Results  Component Value Date   HGBA1C 6.2 12/30/2020  Assessed patient's understanding of A1c goal: <6.5% Provided education to patient about basic DM disease process; Reviewed medications with patient and discussed importance of medication adherence;        Reviewed prescribed diet with patient diabetic/ heart healthy; Discussed plans with patient for ongoing care management follow up and provided patient with direct contact information for care management team;      Reviewed scheduled/upcoming provider appointments including: 06/29/21 orthopedic provider; 07/01/21 PCP;         Advised patient, providing education and rationale, to check cbg 2-3 times each week fasting and record        Review of patient status, including review of consultants reports, relevant laboratory and other test results, and medications completed;       Screening for signs and symptoms of depression related to chronic disease state;        Assessed social determinant of health  barriers;        Confirmed scheduled annual eye exam in September 2022  Hypertension: (Status: New goal.) Last practice recorded BP readings:  BP Readings from Last 3 Encounters:  04/21/21 (!) 179/79  12/30/20 126/80  07/07/20 130/80  Most recent eGFR/CrCl:  Lab Results  Component Value Date   EGFR 70 (L) 09/09/2015    No components found for: CRCL  Evaluation of current treatment plan related to hypertension self management and patient's adherence to plan as established by provider;   Encouraged patient to continue monitoring/ recording weekly blood pressures at home Confirmed patient gets regular structured activity- goes to gym 2-3 times per week   Patient Goals/Self-Care Activities: Patient will self administer medications as prescribed Patient will attend all scheduled provider appointments Patient will call pharmacy for medication refills Patient will call provider office for new concerns or questions      Plan: Telephone follow up appointment with care management team member scheduled for:  Tuesday, August 17, 2021 at 11:30 am The patient has been provided with contact information for the care management team and has been advised to call with any health related questions or concerns  Oneta Rack, RN, BSN, [place] [phone]: direct office [phone])  [phone]: mobile

## 2021-06-16 NOTE — Patient Instructions (Signed)
Visit [place], it was nice talking with you today.   Please read over the attached information, and start now to write down your fasting blood sugars and blood pressures 2-3 times each week: we will talk about these during each of our phone call appointments   I look forward to talking to you again for an update on Tuesday, August 17, 2021 at 11:30 am- please be listening out for my call that day.  I will call as close to 11:30 am as possible.  If you need to cancel or re-schedule our telephone visit, please call [phone] and one of our care guides will be happy to assist you.  I look forward to hearing about your progress.   Please don't hesitate to contact me if I can be of assistance to you before our next scheduled appointment.   Oneta Rack, RN, BSN, [facility] RN Care Coordination- [place] [phone]: direct office [phone]: mobile    PATIENT GOALS:   Goals Addressed             This Visit's Progress    Patient self-Care Activities   On track    Timeframe:  Long-Range Goal Priority:  Medium Start Date:          06/16/21                   Expected End Date:      06/16/22                  Patient will self administer medications as prescribed Patient will attend all scheduled provider appointments Patient will call pharmacy for medication refills Patient will call provider office for new concerns or questions       Critical care medicine: Principles of diagnosis and management in the adult (4th ed., pp. 4562-5638). Saunders."> Miller's anesthesia (8th ed., pp. 232-250). Saunders.">  Advance Directive  Advance directives are legal documents that allow you to make decisions about your health care and medical treatment in case you become unable to communicate for yourself. Advance directives let your wishes be known to family, friends,and health care providers. Discussing and writing advance directives should  happen over time rather than all at once. Advance directives can be changed and updated at any time. There are different types of advance directives, such as: Medical power of attorney. Living will. Do not resuscitate (DNR) order or do not attempt resuscitation (DNAR) order. Health care proxy and medical power of attorney A health care proxy is also called a health care agent. This person is appointed to make medical decisions for you when you are unable to make decisions for yourself. Generally, people ask a trusted friend or family member to act as their proxy and represent their preferences. Make sure you have an agreement with your trusted person to act as your proxy. A proxy may have tomake a medical decision on your behalf if your wishes are not known. A medical power of attorney, also called a durable power of attorney for health care, is a legal document that names your health care proxy. Depending on the laws in your state, the document may need to be: Signed. Notarized. Dated. Copied. Witnessed. Incorporated into your medical record. You may also want to appoint a trusted person to manage your money in the event you are unable to do so. This is called a durable power of attorney for finances. It is a separate legal document  from the durable power of attorney for health care. You may choose your health care proxy or someone different toact as your agent in money matters. If you do not appoint a proxy, or there is a concern that the proxy is not acting in your best interest, a court may appoint a guardian to act on yourbehalf. Living will A living will is a set of instructions that state your wishes about medical care when you cannot express them yourself. Health care providers should keep a copy of your living will in your medical record. You may want to give a copy to family members or friends. To alert caregivers in case of an emergency, you can place a card in your wallet to let them know  that you have a living will and where they can find it. A living will is used if you become: Terminally ill. Disabled. Unable to communicate or make decisions. The following decisions should be included in your living will: To use or not to use life support equipment, such as dialysis machines and breathing machines (ventilators). Whether you want a DNR or DNAR order. This tells health care providers not to use cardiopulmonary resuscitation (CPR) if breathing or heartbeat stops. To use or not to use tube feeding. To be given or not to be given food and fluids. Whether you want comfort (palliative) care when the goal becomes comfort rather than a cure. Whether you want to donate your organs and tissues. A living will does not give instructions for distributing your money andproperty if you should pass away. DNR or DNAR A DNR or DNAR order is a request not to have CPR in the event that your heart stops beating or you stop breathing. If a DNR or DNAR order has not been made and shared, a health care provider will try to help any patient whose heart has stopped or who has stopped breathing. If you plan to have surgery, talk with your health care provider about how your DNR or DNAR order will be followed ifproblems occur. What if I do not have an advance directive? Some states assign family decision makers to act on your behalf if you do not have an advance directive. Each state has its own laws about advance directives. You may want to check with your health care provider, attorney, orstate representative about the laws in your state. Summary Advance directives are legal documents that allow you to make decisions about your health care and medical treatment in case you become unable to communicate for yourself. The process of discussing and writing advance directives should happen over time. You can change and update advance directives at any time. Advance directives may include a medical power of  attorney, a living will, and a DNR or DNAR order. This information is not intended to replace advice given to you by your health care provider. Make sure you discuss any questions you have with your healthcare provider. Document Revised: 08/04/2020 Document Reviewed: 08/04/2020 Elsevier Patient Education  2022 [employer].  Consent to CCM Services: Ms. Borin was given information about Chronic Care Management services including:  CCM service includes personalized support from designated clinical staff supervised by her physician, including individualized plan of care and coordination with other care providers 24/7 contact phone numbers for assistance for urgent and routine care needs. Service will only be billed when office clinical staff spend 20 minutes or more in a month to coordinate care. Only one practitioner may furnish and bill the service in  a calendar month. The patient may stop CCM services at any time (effective at the end of the month) by phone call to the office staff. The patient will be responsible for cost sharing (co-pay) of up to 20% of the service fee (after annual deductible is met).  Patient agreed to services and verbal consent obtained.   The patient verbalized understanding of instructions, educational materials, and care plan provided today and agreed to receive a mailed copy of patient instructions, educational materials, and care plan.  Telephone follow up appointment with care management team member scheduled for:  Tuesday, August 17, 2021 at 11:30 am The patient has been provided with contact information for the care management team and has been advised to call with any health related questions or concerns.   Oneta Rack, RN, BSN, [facility] RN Care Coordination- [place] [phone]: direct office [phone]: mobile    CLINICAL CARE PLAN: Patient Care Plan: RN Care Manager Plan of Care     Problem Identified:  Chronic Disease Management Needs   Priority: Medium     Long-Range Goal: Development of plan of care for long term chronic disease management   Start Date: 06/16/2021  Expected End Date: 06/16/2022  Priority: Medium  Note:   Current Barriers:  Chronic Disease Management support and education needs related to HTN and DMII RNCM Clinical Goal(s):  Patient will demonstrate ongoing adherence to prescribed treatment plan for HTN and DMII as evidenced by adherence to ADA/ carb modified heart healthy diet; adherence to prescribed medication regimen; regular exercise/ activity; contacting provider for new or worsened symptoms or questions through collaboration with RN Care manager, provider, and care team.  Interventions: 1:1 collaboration with primary care provider regarding development and update of comprehensive plan of care as evidenced by provider attestation and co-signature Inter-disciplinary care team collaboration (see longitudinal plan of care) Evaluation of current treatment plan related to  self management and patient's adherence to plan as established by provider;  Diabetes:  (Status: New goal.) Lab Results  Component Value Date   HGBA1C 6.2 12/30/2020  Assessed patient's understanding of A1c goal: <6.5% Provided education to patient about basic DM disease process; Reviewed medications with patient and discussed importance of medication adherence;        Reviewed prescribed diet with patient diabetic/ heart healthy; Discussed plans with patient for ongoing care management follow up and provided patient with direct contact information for care management team;      Reviewed scheduled/upcoming provider appointments including: 06/29/21 orthopedic provider; 07/01/21 PCP;         Advised patient, providing education and rationale, to check cbg 2-3 times each week fasting and record        Review of patient status, including review of consultants reports, relevant laboratory and other test  results, and medications completed;       Screening for signs and symptoms of depression related to chronic disease state;        Assessed social determinant of health barriers;        Confirmed scheduled annual eye exam in September 2022  Hypertension: (Status: New goal.) Last practice recorded BP readings:  BP Readings from Last 3 Encounters:  04/21/21 (!) 179/79  12/30/20 126/80  07/07/20 130/80  Most recent eGFR/CrCl:  Lab Results  Component Value Date   EGFR 70 (L) 09/09/2015    No components found for: CRCL  Evaluation of current treatment plan related to hypertension self management and patient's adherence  to plan as established by provider;   Encouraged patient to continue monitoring/ recording weekly blood pressures at home Confirmed patient gets regular structured activity- goes to gym 2-3 times per week   Patient Goals/Self-Care Activities: Patient will self administer medications as prescribed Patient will attend all scheduled provider appointments Patient will call pharmacy for medication refills Patient will call provider office for new concerns or questions

## 2021-06-21 ENCOUNTER — Other Ambulatory Visit: Payer: Self-pay | Admitting: Internal Medicine

## 2021-06-29 ENCOUNTER — Encounter: Payer: Self-pay | Admitting: Orthopaedic Surgery

## 2021-06-29 ENCOUNTER — Other Ambulatory Visit: Payer: Self-pay

## 2021-06-29 ENCOUNTER — Ambulatory Visit (INDEPENDENT_AMBULATORY_CARE_PROVIDER_SITE_OTHER): Payer: Medicare PPO | Admitting: Orthopaedic Surgery

## 2021-06-29 VITALS — BP 188/95 | HR 61

## 2021-06-29 DIAGNOSIS — M76821 Posterior tibial tendinitis, right leg: Secondary | ICD-10-CM

## 2021-06-29 NOTE — Progress Notes (Signed)
Office Visit Note   Patient: Kelly Bell           Date of Birth: 1941-06-11           MRN: [id number] Visit Date: 06/29/2021              Requested by: Biagio Borg, MD [place],  [place] [id number] PCP: Biagio Borg, MD   Assessment & Plan: Visit Diagnoses:  1. Posterior tibial tendinitis, right leg     Plan: We will try lace up Swede-O to help rest the tendon.  If she has persistent symptoms she will need an MRI scan.  I plan to recheck her in 2 months.  If she gets increased symptoms and has difficulty walking she will call and we can proceed with an MRI.  Otherwise recheck 2 months.  Follow-Up Instructions: Return in about 2 months (around 08/29/2021).   Orders:  No orders of the defined types were placed in this encounter.  No orders of the defined types were placed in this encounter.     Procedures: No procedures performed   Clinical Data: No additional findings.   Subjective: Chief Complaint  Patient presents with   Right Ankle - Pain, Follow-up    HPI 80 year old female returns for right posterior tibial tendinitis.  States she could not use the cam boot since it seemed like her foot was too tall she was off balance and was afraid she would fall.  She tried it but has not been able to use it other than part of 1 day.  She continues to have pain and states she is used a cane stay off ankle which is helped.  Previously we discussed the meloxicam, Aspercreme.  She does not have a lace up Swede-O.  Review of Systems positive history for breast cancer as well as knee osteoarthritis.  All other systems noncontributory to HPI.   Objective: Vital Signs: BP (!) 188/95   Pulse 61   Physical Exam Constitutional:      Appearance: She is well-developed.  HENT:     Head: Normocephalic.     Right Ear: External ear normal.     Left Ear: External ear normal. There is no impacted cerumen.  Eyes:     Pupils: Pupils are equal, round, and reactive to  light.  Neck:     Thyroid: No thyromegaly.     Trachea: No tracheal deviation.  Cardiovascular:     Rate and Rhythm: Normal rate.  Pulmonary:     Effort: Pulmonary effort is normal.  Abdominal:     Palpations: Abdomen is soft.  Musculoskeletal:     Cervical back: No rigidity.  Skin:    General: Skin is warm and dry.  Neurological:     Mental Status: She is alert and oriented to person, place, and time.  Psychiatric:        Behavior: Behavior normal.    Ortho Exam pain with right posterior tibial resistive testing.  Tenderness of the tendon sheath.  Tendon is active.  No tenderness of the opposite left posterior tibial tendon.  Peroneals are normal good ankle range of motion no ankle joint swelling.  Specialty Comments:  No specialty comments available.  Imaging: No results found.   PMFS History: Patient Active Problem List   Diagnosis Date Noted   Posterior tibial tendinitis, right leg 04/21/2021   Statin myopathy 01/03/2021   Knee pain, right 01/03/2021   Osteoarthritis of both knees 07/07/2020  Heart murmur 06/03/2020   Vitamin D deficiency 10/29/2019   Grief 10/29/2019   Overactive bladder 03/09/2016   Family history of breast cancer 09/22/2015   Malignant neoplasm of upper-inner quadrant of left breast in female, estrogen receptor positive ([place]) 09/09/2015   PPD positive 10/14/2014   Bradycardia 09/17/2014   Edema 03/05/2013   Obesity, Class II, BMI 35-39.9 06/23/2012   Encounter for well adult exam with abnormal findings 06/23/2012   ALLERGIC RHINITIS 08/12/2009   Diabetes type 2, controlled ([place]) 10/01/2007   Hyperlipidemia 09/13/2007   Essential hypertension 09/13/2007   BREAST CANCER, HX OF 09/13/2007   Past Medical History:  Diagnosis Date   Allergy    Arthritis    knees   Breast cancer ([place])    Right   HTN (hypertension)    Hyperlipidemia    Hypertension    Irritable bladder    Knee pain    Bilateral   SVD (spontaneous vaginal delivery)     x 2   Type II or unspecified type diabetes mellitus without mention of complication, not stated as uncontrolled     Family History  Problem Relation Age of Onset   Diabetes Mother 46       Deceased   Pancreatic cancer Mother        dx. early 18s   Diabetes Father 74       Deceased   Heart failure Father    Heart disease Father        CHF   Breast cancer Sister 46   Cancer Brother        oral cancer - metastasis to lungs; smoker   Corneal Dystrophy Son        bilateral   Healthy Daughter        x1   Breast cancer Sister        dx. 70s   Esophageal cancer Maternal Grandfather    Rectal cancer Maternal Grandfather    Stomach cancer Maternal Grandfather    Cancer Cousin        oral cancer dx. 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

## 2021-07-01 ENCOUNTER — Ambulatory Visit: Payer: Medicare PPO | Admitting: Internal Medicine

## 2021-07-23 ENCOUNTER — Other Ambulatory Visit: Payer: Self-pay

## 2021-07-26 ENCOUNTER — Encounter: Payer: Self-pay | Admitting: Internal Medicine

## 2021-07-26 ENCOUNTER — Ambulatory Visit (INDEPENDENT_AMBULATORY_CARE_PROVIDER_SITE_OTHER): Payer: Medicare PPO | Admitting: Internal Medicine

## 2021-07-26 ENCOUNTER — Other Ambulatory Visit: Payer: Self-pay

## 2021-07-26 VITALS — BP 148/70 | HR 46 | Temp 98.5°F | Ht 65.0 in | Wt 223.0 lb

## 2021-07-26 DIAGNOSIS — E1165 Type 2 diabetes mellitus with hyperglycemia: Secondary | ICD-10-CM

## 2021-07-26 DIAGNOSIS — E7800 Pure hypercholesterolemia, unspecified: Secondary | ICD-10-CM | POA: Diagnosis not present

## 2021-07-26 DIAGNOSIS — R609 Edema, unspecified: Secondary | ICD-10-CM | POA: Diagnosis not present

## 2021-07-26 DIAGNOSIS — I10 Essential (primary) hypertension: Secondary | ICD-10-CM | POA: Diagnosis not present

## 2021-07-26 DIAGNOSIS — J301 Allergic rhinitis due to pollen: Secondary | ICD-10-CM

## 2021-07-26 DIAGNOSIS — R001 Bradycardia, unspecified: Secondary | ICD-10-CM | POA: Diagnosis not present

## 2021-07-26 DIAGNOSIS — E538 Deficiency of other specified B group vitamins: Secondary | ICD-10-CM

## 2021-07-26 DIAGNOSIS — E559 Vitamin D deficiency, unspecified: Secondary | ICD-10-CM | POA: Diagnosis not present

## 2021-07-26 DIAGNOSIS — Z23 Encounter for immunization: Secondary | ICD-10-CM

## 2021-07-26 LAB — BASIC METABOLIC PANEL
BUN: 13 mg/dL (ref 6–23)
CO2: 28 mEq/L (ref 19–32)
Calcium: 9.9 mg/dL (ref 8.4–10.5)
Chloride: 105 mEq/L (ref 96–112)
Creatinine, Ser: 0.78 mg/dL (ref 0.40–1.20)
GFR: 71.18 mL/min (ref >60.00)
Glucose, Bld: 121 mg/dL — ABNORMAL HIGH (ref 70–99)
Potassium: 4.1 mEq/L (ref 3.5–5.1)
Sodium: 140 mEq/L (ref 135–145)

## 2021-07-26 LAB — LIPID PANEL
Cholesterol: 216 mg/dL — ABNORMAL HIGH (ref 0–200)
HDL: 44.6 mg/dL (ref >39.00)
LDL Cholesterol: 139 mg/dL — ABNORMAL HIGH (ref 0–99)
NonHDL: 171.54
Total CHOL/HDL Ratio: 5
Triglycerides: 161 mg/dL — ABNORMAL HIGH (ref 0.0–149.0)
VLDL: 32.2 mg/dL (ref 0.0–40.0)

## 2021-07-26 LAB — HEPATIC FUNCTION PANEL
ALT: 15 U/L (ref 0–35)
AST: 17 U/L (ref 0–37)
Albumin: 4.1 g/dL (ref 3.5–5.2)
Alkaline Phosphatase: 50 U/L (ref 39–117)
Bilirubin, Direct: 0.1 mg/dL (ref 0.0–0.3)
Total Bilirubin: 0.7 mg/dL (ref 0.2–1.2)
Total Protein: 6.8 g/dL (ref 6.0–8.3)

## 2021-07-26 LAB — VITAMIN B12: Vitamin B-12: 1254 pg/mL — ABNORMAL HIGH (ref 211–911)

## 2021-07-26 LAB — HEMOGLOBIN A1C: Hgb A1c MFr Bld: 6.5 % (ref 4.6–6.5)

## 2021-07-26 LAB — VITAMIN D 25 HYDROXY (VIT D DEFICIENCY, FRACTURES): VITD: 62.33 ng/mL (ref 30.00–100.00)

## 2021-07-26 MED ORDER — FLUTICASONE PROPIONATE 50 MCG/ACT NA SUSP: NASAL | 1 refills | Status: DC

## 2021-07-26 MED ORDER — LOSARTAN POTASSIUM-HCTZ 50-12.5 MG PO TABS: 1.0000 | ORAL_TABLET | Freq: Every day | ORAL | 3 refills | Status: DC

## 2021-07-26 MED ORDER — EZETIMIBE 10 MG PO TABS: 10.0000 mg | ORAL_TABLET | ORAL | 3 refills | Status: DC

## 2021-07-26 NOTE — Patient Instructions (Addendum)
Please consider the Novavax covid vaccine next month instead of the covid booster  Ok to stop the toprol xl (metoprolol)  Please take all new medication as prescribed   - the losaran HCT  Please check your BP and pulse (heart rate) at home to make sure the pulse comes back up at least higher than 50)  Ok to take the zetia for cholesterol at every other day  Please continue all other medications as before, and refills have been done if requested.  Please have the pharmacy call with any other refills you may need.  Please continue your efforts at being more active, low cholesterol diet, and weight control  Please keep your appointments with your specialists as you may have planned  Please go to the LAB at the blood drawing area for the tests to be done  You will be contacted by phone if any changes need to be made immediately.  Otherwise, you will receive a letter about your results with an explanation, but please check with MyChart first.  Please remember to sign up for MyChart if you have not done so, as this will be important to you in the future with finding out test results, communicating by private email, and scheduling acute appointments online when needed.  Please make an Appointment to return in 6 months, or sooner if needed

## 2021-07-26 NOTE — Progress Notes (Signed)
Patient ID: Kelly Bell, female   DOB: [birth year], 80 y.o.   MRN: [id number]        Chief Complaint: follow up HTN, HLD and hyperglycemia , low vit d, mild edema       HPI:  Kelly Bell is a 80 y.o. female here overall doing ok, but does have intermittent fatigue; Pt denies chest pain, increased sob or doe, wheezing, orthopnea, PND, palpitations, dizziness or syncope, except for several weeks of persistent pedal edema.   Pt denies polydipsia, polyuria, or new focal neuro s/s.  Pt states she had msk pain from the zetia even though it is not a statin and has stopped taking.   Pt denies fever, wt loss, night sweats, loss of appetite, or other constitutional symptoms   Has ongoing right ankle pain followed per Dr Lorin [facility] ortho.  No other new complaints  Not taking Vit D Wt Readings from Last 3 Encounters:  07/26/21 223 lb (101.2 kg)  12/30/20 222 lb (100.7 kg)  07/07/20 224 lb (101.6 kg)   BP Readings from Last 3 Encounters:  07/26/21 (!) 148/70  06/29/21 (!) 188/95  04/21/21 (!) 179/79         Past Medical History:  Diagnosis Date   Allergy    Arthritis    knees   Breast cancer ([place])    Right   HTN (hypertension)    Hyperlipidemia    Hypertension    Irritable bladder    Knee pain    Bilateral   SVD (spontaneous vaginal delivery)    x 2   Type II or unspecified type diabetes mellitus without mention of complication, not stated as uncontrolled    Past Surgical History:  Procedure Laterality Date   ABDOMINAL HYSTERECTOMY  1975   COLONOSCOPY  05/10/05, 2011   hx polyps   MASTECTOMY PARTIAL / LUMPECTOMY W/ AXILLARY LYMPHADENECTOMY  07/1986   MASTECTOMY W/ SENTINEL NODE BIOPSY Left 10/28/2015   Procedure: LEFT MASTECTOMY WITH SENTINEL LYMPH NODE BIOPSY;  Surgeon: Stark Klein, MD;  Location: [facility];  Service: General;  Laterality: Left;   SHOULDER ARTHROSCOPY  2000    reports that she has never smoked. She has never used smokeless tobacco. She reports current  alcohol use. She reports that she does not use drugs. family history includes Breast cancer in her sister; Breast cancer (age of onset: 4) in her sister; Cancer in her brother and cousin; Corneal Dystrophy in her son; Diabetes (age of onset: 56) in her father and mother; Esophageal cancer in her maternal grandfather; Healthy in her daughter; Heart disease in her father; Heart failure in her father; Pancreatic cancer in her mother; Rectal cancer in her maternal grandfather; Stomach cancer in her maternal grandfather. Allergies  Allergen Reactions   Hydrocodone-Acetaminophen Other (See Comments)    Mental confusion   Metformin And Related Diarrhea   Pravastatin Other (See Comments)    Severe muscle cramps.   Lisinopril     cough   Current Outpatient Medications on File Prior to Visit  Medication Sig Dispense Refill   ACCU-CHEK AVIVA PLUS test strip USE TO CHECK BLOOD SUGAR ONCE DAILY. Dx: E11.9 100 each 0   aspirin 325 MG tablet Take 325 mg by mouth daily.     Blood Glucose Monitoring Suppl (ACCU-CHEK AVIVA PLUS) w/Device KIT USE AS DIRECTED TO CHECK GLUCOSE 1 kit 0   Calcium Carbonate-Vitamin D 600-400 MG-UNIT tablet Take 1 tablet by mouth 2 (two) times daily.  nabumetone (RELAFEN) 500 MG tablet TAKE 1 TABLET BY MOUTH TWICE DAILY AS NEEDED AFTER A MEAL FOR ARTHRITIS 60 tablet 0   oxybutynin (DITROPAN) 5 MG tablet Take 1 tablet by mouth 4 times daily 360 tablet 0   No current facility-administered medications on file prior to visit.        ROS:  All others reviewed and negative.  Objective        PE:  BP (!) 148/70 (BP Location: Left Arm, Patient Position: Sitting, Cuff Size: Large)   Pulse (!) 46   Temp 98.5 F (36.9 C) (Oral)   Ht 5' 5"  (1.651 m)   Wt 223 lb (101.2 kg)   SpO2 98%   BMI 37.11 kg/m                 Constitutional: Pt appears in NAD               HENT: Head: NCAT.                Right Ear: External ear normal.                 Left Ear: External ear normal.                 Eyes: . Pupils are equal, round, and reactive to light. Conjunctivae and EOM are normal               Nose: without d/c or deformity               Neck: Neck supple. Gross normal ROM               Cardiovascular: Normal rate and regular rhythm.                 Pulmonary/Chest: Effort normal and breath sounds without rales or wheezing.                Abd:  Soft, NT, ND, + BS, no organomegaly               Neurological: Pt is alert. At baseline orientation, motor grossly intact               Skin: Skin is warm. No rashes, no other new lesions, LE edema - trace pedal               Psychiatric: Pt behavior is normal without agitation   Micro: none  Cardiac tracings I have personally interpreted today:  none  Pertinent Radiological findings (summarize): none   Lab Results  Component Value Date   WBC 4.6 12/30/2020   HGB 14.6 12/30/2020   HCT 42.9 12/30/2020   PLT 186.0 12/30/2020   GLUCOSE 121 (H) 07/26/2021   CHOL 216 (H) 07/26/2021   TRIG 161.0 (H) 07/26/2021   HDL 44.60 07/26/2021   LDLDIRECT 111.5 01/01/2007   LDLCALC 139 (H) 07/26/2021   ALT 15 07/26/2021   AST 17 07/26/2021   NA 140 07/26/2021   K 4.1 07/26/2021   CL 105 07/26/2021   CREATININE 0.78 07/26/2021   BUN 13 07/26/2021   CO2 28 07/26/2021   TSH 1.27 12/30/2020   HGBA1C 6.5 07/26/2021   MICROALBUR 1.3 12/30/2020   Assessment/Plan:  Kelly Bell is a 80 y.o. Black or African American [2] female with  has a past medical history of Allergy, Arthritis, Breast cancer ([place]), HTN (hypertension), Hyperlipidemia, Hypertension, Irritable bladder, Knee pain, SVD (spontaneous vaginal delivery), and Type II  or unspecified type diabetes mellitus without mention of complication, not stated as uncontrolled.  Vitamin D deficiency Last vitamin D Lab Results  Component Value Date   VD25OH 26.64 (L) 12/30/2020   Low, to start oral replacement   Bradycardia Noted today, ? Related to her fatigue, ok to d/c  toprol, f/u HR with BP at home as she does  Hyperlipidemia Pt states myalgias with zeta; I explained this is not a statin, and asked if she would at least take qod  Essential hypertension BP Readings from Last 3 Encounters:  07/26/21 (!) 148/70  06/29/21 (!) 188/95  04/21/21 (!) 179/79   Persistent uncontrolled, pt to change losartan to losartan HCT,  to continue to monitor at home   Edema For add hct as above  Diabetes type 2, controlled ([place]) Lab Results  Component Value Date   HGBA1C 6.5 07/26/2021   Stable, pt to continue current medical treatment  - diet Followup: Return in about 6 months (around 01/23/2022).  Cathlean Cower, MD 08/01/2021 3:29 PM [facility]

## 2021-07-26 NOTE — Assessment & Plan Note (Signed)
Last vitamin D Lab Results  Component Value Date   VD25OH 26.64 (L) 12/30/2020   Low, to start oral replacement

## 2021-07-29 NOTE — Telephone Encounter (Signed)
Needs OV please.

## 2021-08-01 ENCOUNTER — Encounter: Payer: Self-pay | Admitting: Internal Medicine

## 2021-08-01 NOTE — Assessment & Plan Note (Signed)
BP Readings from Last 3 Encounters:  07/26/21 (!) 148/70  06/29/21 (!) 188/95  04/21/21 (!) 179/79   Persistent uncontrolled, pt to change losartan to losartan HCT,  to continue to monitor at home

## 2021-08-01 NOTE — Assessment & Plan Note (Signed)
Pt states myalgias with zeta; I explained this is not a statin, and asked if she would at least take qod

## 2021-08-01 NOTE — Assessment & Plan Note (Signed)
Noted today, ? Related to her fatigue, ok to d/c toprol, f/u HR with BP at home as she does

## 2021-08-01 NOTE — Assessment & Plan Note (Signed)
Lab Results  Component Value Date   HGBA1C 6.5 07/26/2021   Stable, pt to continue current medical treatment  - diet

## 2021-08-01 NOTE — Assessment & Plan Note (Signed)
For add hct as above

## 2021-08-02 NOTE — Telephone Encounter (Signed)
Same answer as last message   thanks

## 2021-08-07 ENCOUNTER — Other Ambulatory Visit: Payer: Self-pay | Admitting: Internal Medicine

## 2021-08-07 NOTE — Telephone Encounter (Signed)
Please refill as per office routine med refill policy (all routine meds to be refilled for 3 mo or monthly (per pt preference) up to one year from last visit, then month to month grace period for 3 mo, then further med refills will have to be denied) ? ?

## 2021-08-09 ENCOUNTER — Encounter: Payer: Self-pay | Admitting: Internal Medicine

## 2021-08-09 ENCOUNTER — Other Ambulatory Visit: Payer: Self-pay

## 2021-08-09 ENCOUNTER — Ambulatory Visit: Payer: Medicare PPO | Admitting: Internal Medicine

## 2021-08-09 VITALS — BP 128/80 | HR 67 | Temp 98.2°F | Resp 18 | Ht 65.0 in | Wt 222.6 lb

## 2021-08-09 DIAGNOSIS — I10 Essential (primary) hypertension: Secondary | ICD-10-CM

## 2021-08-09 DIAGNOSIS — E559 Vitamin D deficiency, unspecified: Secondary | ICD-10-CM

## 2021-08-09 DIAGNOSIS — R001 Bradycardia, unspecified: Secondary | ICD-10-CM

## 2021-08-09 DIAGNOSIS — E1165 Type 2 diabetes mellitus with hyperglycemia: Secondary | ICD-10-CM

## 2021-08-09 MED ORDER — LOSARTAN POTASSIUM-HCTZ 50-12.5 MG PO TABS: 0.5000 | ORAL_TABLET | Freq: Every day | ORAL | 3 refills | Status: DC

## 2021-08-09 MED ORDER — METOPROLOL SUCCINATE ER 25 MG PO TB24: 12.5000 mg | ORAL_TABLET | Freq: Every day | ORAL | 3 refills | Status: DC

## 2021-08-09 NOTE — Assessment & Plan Note (Signed)
Lab Results  Component Value Date   HGBA1C 6.5 07/26/2021   Stable, pt to continue current medical treatment  - diet

## 2021-08-09 NOTE — Patient Instructions (Signed)
Ok to restart the toprol XL 25 mg at HALF pill per day  Ok to decrease the losartan HCT to HALF pill per day (if the pill will let you do that)  Call if you have trouble and we can send different prescriptions  Call if you still have the palpitations, as you may want to see Cardiology  Please continue all other medications as before, and refills have been done if requested.  Please have the pharmacy call with any other refills you may need.  Please keep your appointments with your specialists as you may have planned

## 2021-08-09 NOTE — Assessment & Plan Note (Signed)
Last vitamin D Lab Results  Component Value Date   VD25OH 62.33 07/26/2021   Stable, cont oral replacement

## 2021-08-09 NOTE — Progress Notes (Signed)
Patient ID: [name], female   DOB: 11-04-1941, 80 y.o.   MRN: [id number]        Chief Complaint: follow up HTN, palpitaitons, low vit d, dm       HPI:  Kelly Bell is a 80 y.o. female here to f/u after last visit with c/o fatigue and low HR in 40s, and toprol xl 25 mg changed to losartan hct.but unfortuantely stopping the BB did no improve the fatigue, and led to increased intermittent rapid palpiations very uncomfortable  Pt denies chest pain, increased sob or doe, wheezing, orthopnea, PND, increased LE swelling dizziness or syncope.  Pt denies polydipsia, polyuria, or new focal neuro s/s.   Pt denies fever, wt loss, night sweats, loss of appetite, or other constitutional symptoms  Denies worsening depressive symptoms, suicidal ideation, or panic       Wt Readings from Last 3 Encounters:  08/09/21 222 lb 9.6 oz (101 kg)  07/26/21 223 lb (101.2 kg)  12/30/20 222 lb (100.7 kg)   BP Readings from Last 3 Encounters:  08/09/21 128/80  07/26/21 (!) 148/70  06/29/21 (!) 188/95         Past Medical History:  Diagnosis Date   Allergy    Arthritis    knees   Breast cancer ([place])    Right   HTN (hypertension)    Hyperlipidemia    Hypertension    Irritable bladder    Knee pain    Bilateral   SVD (spontaneous vaginal delivery)    x 2   Type II or unspecified type diabetes mellitus without mention of complication, not stated as uncontrolled    Past Surgical History:  Procedure Laterality Date   ABDOMINAL HYSTERECTOMY  1975   COLONOSCOPY  05/10/05, 2011   hx polyps   MASTECTOMY PARTIAL / LUMPECTOMY W/ AXILLARY LYMPHADENECTOMY  07/1986   MASTECTOMY W/ SENTINEL NODE BIOPSY Left 10/28/2015   Procedure: LEFT MASTECTOMY WITH SENTINEL LYMPH NODE BIOPSY;  Surgeon: Kelly Klein, MD;  Location: [place];  Service: General;  Laterality: Left;   SHOULDER ARTHROSCOPY  2000    reports that she has never smoked. She has never used smokeless tobacco. She reports current alcohol  use. She reports that she does not use drugs. family history includes Breast cancer in her sister; Breast cancer (age of onset: 53) in her sister; Cancer in her brother and cousin; Corneal Dystrophy in her son; Diabetes (age of onset: 45) in her father and mother; Esophageal cancer in her maternal grandfather; Healthy in her daughter; Heart disease in her father; Heart failure in her father; Pancreatic cancer in her mother; Rectal cancer in her maternal grandfather; Stomach cancer in her maternal grandfather. Allergies  Allergen Reactions   Hydrocodone-Acetaminophen Other (See Comments)    Mental confusion   Metformin And Related Diarrhea   Pravastatin Other (See Comments)    Severe muscle cramps.   Lisinopril     cough   Current Outpatient Medications on File Prior to Visit  Medication Sig Dispense Refill   ACCU-CHEK AVIVA PLUS test strip USE TO CHECK BLOOD SUGAR ONCE DAILY. Dx: E11.9 100 each 0   aspirin 325 MG tablet Take 325 mg by mouth daily.     Blood Glucose Monitoring Suppl (ACCU-CHEK AVIVA PLUS) w/Device KIT USE AS DIRECTED TO CHECK GLUCOSE 1 kit 0   Calcium Carbonate-Vitamin D 600-400 MG-UNIT tablet Take 1 tablet by mouth 2 (two) times daily.     ezetimibe (ZETIA) 10 MG tablet  Take 1 tablet (10 mg total) by mouth every other day. 45 tablet 3   fluticasone (FLONASE) 50 MCG/ACT nasal spray USE TWO SPRAY IN EACH NOSTRIL ONCE DAILY 48 g 1   nabumetone (RELAFEN) 500 MG tablet TAKE 1 TABLET BY MOUTH TWICE DAILY AS NEEDED AFTER A MEAL FOR ARTHRITIS 60 tablet 0   oxybutynin (DITROPAN) 5 MG tablet Take 1 tablet by mouth 4 times daily 360 tablet 3   No current facility-administered medications on file prior to visit.        ROS:  All others reviewed and negative.  Objective        PE:  BP 128/80   Pulse 67   Temp 98.2 F (36.8 C) (Oral)   Resp 18   Ht 5' 5"  (1.651 m)   Wt 222 lb 9.6 oz (101 kg)   SpO2 98%   BMI 37.04 kg/m                 Constitutional: Pt appears in NAD                HENT: Head: NCAT.                Right Ear: External ear normal.                 Left Ear: External ear normal.                Eyes: . Pupils are equal, round, and reactive to light. Conjunctivae and EOM are normal               Nose: without d/c or deformity               Neck: Neck supple. Gross normal ROM               Cardiovascular: Normal rate and regular rhythm.                 Pulmonary/Chest: Effort normal and breath sounds without rales or wheezing.                Abd:  Soft, NT, ND, + BS, no organomegaly               Neurological: Pt is alert. At baseline orientation, motor grossly intact               Skin: Skin is warm. No rashes, no other new lesions, LE edema - none               Psychiatric: Pt behavior is normal without agitation   Micro: none  Cardiac tracings I have personally interpreted today:  none  Pertinent Radiological findings (summarize): none   Lab Results  Component Value Date   WBC 4.6 12/30/2020   HGB 14.6 12/30/2020   HCT 42.9 12/30/2020   PLT 186.0 12/30/2020   GLUCOSE 121 (H) 07/26/2021   CHOL 216 (H) 07/26/2021   TRIG 161.0 (H) 07/26/2021   HDL 44.60 07/26/2021   LDLDIRECT 111.5 01/01/2007   LDLCALC 139 (H) 07/26/2021   ALT 15 07/26/2021   AST 17 07/26/2021   NA 140 07/26/2021   K 4.1 07/26/2021   CL 105 07/26/2021   CREATININE 0.78 07/26/2021   BUN 13 07/26/2021   CO2 28 07/26/2021   TSH 1.27 12/30/2020   HGBA1C 6.5 07/26/2021   MICROALBUR 1.3 12/30/2020   Assessment/Plan:  Kelly Bell is a 80 y.o. Black or African American [2] female  with  has a past medical history of Allergy, Arthritis, Breast cancer ([place]), HTN (hypertension), Hyperlipidemia, Hypertension, Irritable bladder, Knee pain, SVD (spontaneous vaginal delivery), and Type II or unspecified type diabetes mellitus without mention of complication, not stated as uncontrolled.  Vitamin D deficiency Last vitamin D Lab Results  Component Value Date   VD25OH 62.33  07/26/2021   Stable, cont oral replacement   Essential hypertension Ok for taking HALF losartan hct 50/12.5 qd,  to f/u any worsening symptoms or concerns  Bradycardia Pt to continue to monitor closely at home back on HALF toprol xl 25 - 1/2 qd  Diabetes type 2, controlled ([place]) Lab Results  Component Value Date   HGBA1C 6.5 07/26/2021   Stable, pt to continue current medical treatment  - diet  Followup: Return in about 6 months (around 02/06/2022).  Cathlean Cower, MD 08/09/2021 10:29 PM [facility]

## 2021-08-09 NOTE — Assessment & Plan Note (Signed)
Pt to continue to monitor closely at home back on HALF toprol xl 25 - 1/2 qd

## 2021-08-09 NOTE — Assessment & Plan Note (Signed)
[place] for taking HALF losartan hct 50/12.5 qd,  to f/u any worsening symptoms or concerns

## 2021-08-17 ENCOUNTER — Ambulatory Visit (INDEPENDENT_AMBULATORY_CARE_PROVIDER_SITE_OTHER): Payer: Medicare PPO | Admitting: *Deleted

## 2021-08-17 DIAGNOSIS — I10 Essential (primary) hypertension: Secondary | ICD-10-CM

## 2021-08-17 DIAGNOSIS — E1165 Type 2 diabetes mellitus with hyperglycemia: Secondary | ICD-10-CM

## 2021-08-17 DIAGNOSIS — E7800 Pure hypercholesterolemia, unspecified: Secondary | ICD-10-CM

## 2021-08-17 NOTE — Patient Instructions (Signed)
Visit [place], it was nice talking with you today.   I look forward to talking to you again for an update on Wednesday, November 17, 2021 at 11:30 am- please be listening out for my call that day.  I will call as close to 11:30 am as possible.   If you need to cancel or re-schedule our telephone visit, please call [phone] and one of our care guides will be happy to assist you.   I look forward to hearing about your progress.   Please don't hesitate to contact me if I can be of assistance to you before our next scheduled telephone appointment.   Oneta Rack, RN, BSN, [facility] RN Care Coordination- [place] [phone]: direct office [phone]: mobile   PATIENT GOALS:  Goals Addressed             This Visit's Progress    Patient self-Care Activities   On track    Timeframe:  Long-Range Goal Priority:  Medium Start Date:          06/16/21                   Expected End Date:      06/16/22   Patient will self administer medications as prescribed as evidenced by self report/primary caregiver report  Patient will attend all scheduled provider appointments as evidenced by clinician review of documented attendance to scheduled appointments and patient/caregiver report Patient will call pharmacy for medication refills as evidenced by patient report and review of pharmacy fill history as appropriate Patient will call provider office for new concerns or questions as evidenced by review of documented incoming telephone call notes and patient report Patient will continue to monitor and write down on paper weekly blood pressures and blood sugars and will call Dr. Gwynn Burly office/ team for any concerns or problems if they arise Patient will continue to remain active and exercise regularly Patient will continue to follow a heart healthy, low salt, low cholesterol, and low carbohydrate/ low sugar diet                       Cholesterol  Content in Foods Cholesterol is a waxy, fat-like substance that helps to carry fat in the blood. The body needs cholesterol in small amounts, but too much cholesterol can cause damage to the arteries and heart. Most people should eat less than 200 milligrams (mg) of cholesterol a day. Foods with cholesterol Cholesterol is found in animal-based foods, such as meat, seafood, and dairy. Generally, low-fat dairy and lean meats have less cholesterol than full-fat dairy and fatty meats. The milligrams of cholesterol per serving (mg per serving) of common cholesterol-containing foods are listed below. Meat and other proteins Egg -- one large whole egg has 186 mg. Veal shank -- 4 oz has 141 mg. Lean ground [place] (93% lean) -- 4 oz has 118 mg. Fat-trimmed lamb loin -- 4 oz has 106 mg. Lean ground beef (90% lean) -- 4 oz has 100 mg. Lobster -- 3.5 oz has 90 mg. Pork loin chops -- 4 oz has 86 mg. Canned salmon -- 3.5 oz has 83 mg. Fat-trimmed beef top loin -- 4 oz has 78 mg. Frankfurter -- 1 frank (3.5 oz) has 77 mg. Crab -- 3.5 oz has 71 mg. Roasted chicken without skin, white meat -- 4 oz has 66 mg. Light bologna -- 2 oz has 45 mg. Deli-cut [place] -- 2  oz has 31 mg. Canned tuna -- 3.5 oz has 31 mg. Berniece Salines -- 1 oz has 29 mg. Oysters and mussels (raw) -- 3.5 oz has 25 mg. Mackerel -- 1 oz has 22 mg. Trout -- 1 oz has 20 mg. Pork sausage -- 1 link (1 oz) has 17 mg. Salmon -- 1 oz has 16 mg. Tilapia -- 1 oz has 14 mg. Dairy Soft-serve ice cream --  cup (4 oz) has 103 mg. Whole-milk yogurt -- 1 cup (8 oz) has 29 mg. Cheddar cheese -- 1 oz has 28 mg. American cheese -- 1 oz has 28 mg. Whole milk -- 1 cup (8 oz) has 23 mg. 2% milk -- 1 cup (8 oz) has 18 mg. Cream cheese -- 1 tablespoon (Tbsp) has 15 mg. Cottage cheese --  cup (4 oz) has 14 mg. Low-fat (1%) milk -- 1 cup (8 oz) has 10 mg. Sour cream -- 1 Tbsp has 8.5 mg. Low-fat yogurt -- 1 cup (8 oz) has 8 mg. Nonfat Greek yogurt -- 1 cup  (8 oz) has 7 mg. Half-and-half cream -- 1 Tbsp has 5 mg. Fats and oils Cod liver oil -- 1 tablespoon (Tbsp) has 82 mg. Butter -- 1 Tbsp has 15 mg. Lard -- 1 Tbsp has 14 mg. Bacon grease -- 1 Tbsp has 14 mg. Mayonnaise -- 1 Tbsp has 5-10 mg. Margarine -- 1 Tbsp has 3-10 mg. Exact amounts of cholesterol in these foods may vary depending on specific ingredients and brands. Foods without cholesterol Most plant-based foods do not have cholesterol unless you combine them with a food that has cholesterol. Foods without cholesterol include: Grains and cereals. Vegetables. Fruits. Vegetable oils, such as olive, canola, and sunflower oil. Legumes, such as peas, beans, and lentils. Nuts and seeds. Egg whites. Summary The body needs cholesterol in small amounts, but too much cholesterol can cause damage to the arteries and heart. Most people should eat less than 200 milligrams (mg) of cholesterol a day. This information is not intended to replace advice given to you by your health care provider. Make sure you discuss any questions you have with your health care provider. Document Revised: 02/11/2020 Document Reviewed: 03/23/2020 Elsevier Patient Education  2022 [place].   Patient verbalizes understanding of instructions provided today and agrees to view in MyChart Telephone follow up appointment with care management team member scheduled for:  Wednesday, November 17, 2021 at 11:30 am The patient has been provided with contact information for the care management team and has been advised to call with any health related questions or concerns  Oneta Rack, RN, BSN, [place] [phone]: direct office [phone]: mobile

## 2021-08-17 NOTE — Chronic Care Management (AMB) (Signed)
Chronic Care Management   CCM RN Visit Note  08/17/2021 Name: Kelly Bell MRN: [id number] DOB: 03-May-1941  Subjective: Kelly Bell is a 80 y.o. year old female who is a primary care patient of Biagio Borg, MD. The care management team was consulted for assistance with disease management and care coordination needs.    Engaged with patient by telephone for follow up visit in response to provider referral for case management and/or care coordination services.   Consent to Services:  The patient was given information about Chronic Care Management services, agreed to services, and gave verbal consent prior to initiation of services.  Please see initial visit note for detailed documentation.  Patient agreed to services and verbal consent obtained.   Assessment: Review of patient past medical history, allergies, medications, health status, including review of consultants reports, laboratory and other test data, was performed as part of comprehensive evaluation and provision of chronic care management services.   CCM Care Plan  Allergies  Allergen Reactions   Hydrocodone-Acetaminophen Other (See Comments)    Mental confusion   Metformin And Related Diarrhea   Pravastatin Other (See Comments)    Severe muscle cramps.   Lisinopril     cough   Outpatient Encounter Medications as of 08/17/2021  Medication Sig   ezetimibe (ZETIA) 10 MG tablet Take 1 tablet (10 mg total) by mouth every other day.   losartan-hydrochlorothiazide (HYZAAR) 50-12.5 MG tablet Take 0.5 tablets by mouth daily.   metoprolol succinate (TOPROL-XL) 25 MG 24 hr tablet Take 0.5 tablets (12.5 mg total) by mouth daily.   ACCU-CHEK AVIVA PLUS test strip USE TO CHECK BLOOD SUGAR ONCE DAILY. Dx: E11.9   aspirin 325 MG tablet Take 325 mg by mouth daily.   Blood Glucose Monitoring Suppl (ACCU-CHEK AVIVA PLUS) w/Device KIT USE AS DIRECTED TO CHECK GLUCOSE   Calcium Carbonate-Vitamin D 600-400 MG-UNIT tablet Take 1 tablet by  mouth 2 (two) times daily.   fluticasone (FLONASE) 50 MCG/ACT nasal spray USE TWO SPRAY IN EACH NOSTRIL ONCE DAILY   nabumetone (RELAFEN) 500 MG tablet TAKE 1 TABLET BY MOUTH TWICE DAILY AS NEEDED AFTER A MEAL FOR ARTHRITIS   oxybutynin (DITROPAN) 5 MG tablet Take 1 tablet by mouth 4 times daily   No facility-administered encounter medications on file as of 08/17/2021.   Patient Active Problem List   Diagnosis Date Noted   Posterior tibial tendinitis, right leg 04/21/2021   Statin myopathy 01/03/2021   Knee pain, right 01/03/2021   Osteoarthritis of both knees 07/07/2020   Heart murmur 06/03/2020   Vitamin D deficiency 10/29/2019   Grief 10/29/2019   Overactive bladder 03/09/2016   Family history of breast cancer 09/22/2015   Malignant neoplasm of upper-inner quadrant of left breast in female, estrogen receptor positive ([place]) 09/09/2015   PPD positive 10/14/2014   Bradycardia 09/17/2014   Edema 03/05/2013   Obesity, Class II, BMI 35-39.9 06/23/2012   Encounter for well adult exam with abnormal findings 06/23/2012   ALLERGIC RHINITIS 08/12/2009   Diabetes type 2, controlled ([place]) 10/01/2007   Hyperlipidemia 09/13/2007   Essential hypertension 09/13/2007   BREAST CANCER, HX OF 09/13/2007   Conditions to be addressed/monitored:  HTN, HLD, and DMII  Care Plan : RN Care Manager Plan of Care  Updates made by Knox Royalty, RN since 08/17/2021 12:00 AM     Problem: Chronic Disease Management Needs   Priority: Medium     Long-Range Goal: Development of plan of care for long  term chronic disease management   Start Date: 06/16/2021  Expected End Date: 06/16/2022  Priority: Medium  Note:   Current Barriers:  Chronic Disease Management support and education needs related to HTN and DMII RNCM Clinical Goal(s):  Patient will demonstrate ongoing adherence to prescribed treatment plan for HTN and DMII as evidenced by adherence to ADA/ carb modified heart healthy diet; adherence to  prescribed medication regimen; regular exercise/ activity; contacting provider for new or worsened symptoms or questions through collaboration with RN Care manager, provider, and care team.  Interventions: 1:1 collaboration with primary care provider regarding development and update of comprehensive plan of care as evidenced by provider attestation and co-signature Inter-disciplinary care team collaboration (see longitudinal plan of care) Evaluation of current treatment plan related to  self management and patient's adherence to plan as established by provider;  Diabetes:  (Status: Goal on track: YES.) Lab Results  Component Value Date   HGBA1C 6.2 12/30/2020  Assessed patient's understanding of A1c goal: <6.5% Provided education to patient about basic DM disease process; Reviewed prescribed diet with patient diabetic/ heart healthy; Counseled on importance of regular laboratory monitoring as prescribed;        Discussed plans with patient for ongoing care management follow up and provided patient with direct contact information for care management team;      Review of patient status, including review of consultants reports, relevant laboratory and other test results, and medications completed;       Reviewed with patient most recent A1-C value 08/09/21: 6.5 Confirmed patient continues to monitor/ write down on paper fasting blood sugars 2-3 times per week; reports blood sugar this morning of 119; states all values recently have been between 100-130; denies low blood sugar signs/ symptoms at home; no recorded low blood sugars Confirmed patient continues to stay active: to resume working out at [facility] after taking a break to travel to her brother's home Confirmed patient continues to follow prescribed/ recommended diet Discussed importance of ongoing adherence to general plan of care/ cardiovascular risks in setting of DMII  Hypertension: (Status: Goal on track: YES.) Last practice recorded BP  readings:  BP Readings from Last 3 Encounters:  04/21/21 (!) 179/79  12/30/20 126/80  07/07/20 130/80  Most recent eGFR/CrCl:  Lab Results  Component Value Date   EGFR 70 (L) 09/09/2015    No components found for: CRCL  Evaluation of current treatment plan related to hypertension self management and patient's adherence to plan as established by provider;   Counseled on the importance of exercise goals with target of 150 minutes per week Provided education on prescribed diet confirmed patient following heart healthy, low sodium, diabetic diet: provided verbal education around need to limit cholesterol; will provide additional printed education, patient encouraged to view in [place]- reports she will do;  Discussed complications of poorly controlled blood pressure such as heart disease, stroke, circulatory complications, vision complications, kidney impairment, sexual dysfunction;  Confirmed patient has started taking newly prescribed BP medications post- PCP office visit 08/09/21: Metoprolol, losartan, zetia Confirmed patient continues to monitor/ record BP at home weekly: reports under "good control;" denies previously reported "chest pounding" now that medications have been changed Reports consistent blood pressure readings at home between: 128-148/65-80 Confirmed no recent falls; not needing to use cane at present time; continues to see orthopedic provider Lorin [facility]) for ongoing (R) knee/ ankle issues Confirmed patient has obtained flu vaccine for 2022-23 flu season: positive reinforcement provided Confirmed no upcoming scheduled provider appointments  Patient Goals/Self-Care  Activities: Patient will self administer medications as prescribed as evidenced by self report/primary caregiver report  Patient will attend all scheduled provider appointments as evidenced by clinician review of documented attendance to scheduled appointments and patient/caregiver report Patient will call pharmacy for  medication refills as evidenced by patient report and review of pharmacy fill history as appropriate Patient will call provider office for new concerns or questions as evidenced by review of documented incoming telephone call notes and patient report Patient will continue to monitor and write down on paper weekly blood pressures and blood sugars and will call Dr. Gwynn Burly office/ team for any concerns or problems if they arise Patient will continue to remain active and exercise regularly Patient will continue to follow a heart healthy, low salt, low cholesterol, and low carbohydrate/ low sugar diet      Plan: Telephone follow up appointment with care management team member scheduled for:  Wednesday, November 17, 2021 at 11:30 am The patient has been provided with contact information for the care management team and has been advised to call with any health related questions or concerns  Oneta Rack, RN, BSN, [place] [phone]: direct office [phone]: mobile

## 2021-08-26 DIAGNOSIS — Z7722 Contact with and (suspected) exposure to environmental tobacco smoke (acute) (chronic): Secondary | ICD-10-CM | POA: Diagnosis not present

## 2021-08-26 DIAGNOSIS — M1990 Unspecified osteoarthritis, unspecified site: Secondary | ICD-10-CM | POA: Diagnosis not present

## 2021-08-26 DIAGNOSIS — I10 Essential (primary) hypertension: Secondary | ICD-10-CM | POA: Diagnosis not present

## 2021-08-26 DIAGNOSIS — G8929 Other chronic pain: Secondary | ICD-10-CM | POA: Diagnosis not present

## 2021-08-26 DIAGNOSIS — E669 Obesity, unspecified: Secondary | ICD-10-CM | POA: Diagnosis not present

## 2021-08-26 DIAGNOSIS — Z6830 Body mass index (BMI) 30.0-30.9, adult: Secondary | ICD-10-CM | POA: Diagnosis not present

## 2021-08-26 DIAGNOSIS — Z7982 Long term (current) use of aspirin: Secondary | ICD-10-CM | POA: Diagnosis not present

## 2021-08-26 DIAGNOSIS — Z791 Long term (current) use of non-steroidal anti-inflammatories (NSAID): Secondary | ICD-10-CM | POA: Diagnosis not present

## 2021-08-26 DIAGNOSIS — R32 Unspecified urinary incontinence: Secondary | ICD-10-CM | POA: Diagnosis not present

## 2021-09-13 DIAGNOSIS — E7800 Pure hypercholesterolemia, unspecified: Secondary | ICD-10-CM

## 2021-09-13 DIAGNOSIS — E1165 Type 2 diabetes mellitus with hyperglycemia: Secondary | ICD-10-CM | POA: Diagnosis not present

## 2021-09-13 DIAGNOSIS — I10 Essential (primary) hypertension: Secondary | ICD-10-CM | POA: Diagnosis not present

## 2021-11-16 ENCOUNTER — Other Ambulatory Visit: Payer: Self-pay | Admitting: Internal Medicine

## 2021-11-16 NOTE — Telephone Encounter (Signed)
Please refill as per office routine med refill policy (all routine meds to be refilled for 3 mo or monthly (per pt preference) up to one year from last visit, then month to month grace period for 3 mo, then further med refills will have to be denied) ? ?

## 2021-11-17 ENCOUNTER — Telehealth: Payer: Medicare PPO

## 2021-11-22 ENCOUNTER — Ambulatory Visit (INDEPENDENT_AMBULATORY_CARE_PROVIDER_SITE_OTHER): Payer: Medicare PPO | Admitting: *Deleted

## 2021-11-22 DIAGNOSIS — I10 Essential (primary) hypertension: Secondary | ICD-10-CM

## 2021-11-22 DIAGNOSIS — E1165 Type 2 diabetes mellitus with hyperglycemia: Secondary | ICD-10-CM

## 2021-11-22 NOTE — Chronic Care Management (AMB) (Signed)
Chronic Care Management   CCM RN Visit Note  11/22/2021 Name: Kelly Bell MRN: [id number] DOB: 09-21-1941  Subjective: Kelly Bell is a 81 y.o. year old female who is a primary care patient of Biagio Borg, MD. The care management team was consulted for assistance with disease management and care coordination needs.    Engaged with patient by telephone for follow up visit in response to provider referral for case management and/or care coordination services.   Consent to Services:  The patient was given information about Chronic Care Management services, agreed to services, and gave verbal consent prior to initiation of services.  Please see initial visit note for detailed documentation.  Patient agreed to services and verbal consent obtained.   Assessment: Review of patient past medical history, allergies, medications, health status, including review of consultants reports, laboratory and other test data, was performed as part of comprehensive evaluation and provision of chronic care management services.   SDOH (Social Determinants of Health) assessments and interventions performed:  SDOH Interventions    Flowsheet Row Most Recent Value  SDOH Interventions   Food Insecurity Interventions Intervention Not Indicated  [patient denies food insecurity]  Housing Interventions Intervention Not Indicated  [lives in single family home x several decades,  adult son lives with patient]  Transportation Interventions Intervention Not Indicated  [Patient continues to drive self]      CCM Care Plan Allergies  Allergen Reactions   Hydrocodone-Acetaminophen Other (See Comments)    Mental confusion   Metformin And Related Diarrhea   Pravastatin Other (See Comments)    Severe muscle cramps.   Lisinopril     cough   Outpatient Encounter Medications as of 11/22/2021  Medication Sig   ACCU-CHEK AVIVA PLUS test strip USE TO CHECK BLOOD SUGAR ONCE DAILY. Dx: E11.9   aspirin 325 MG tablet Take 325  mg by mouth daily.   Blood Glucose Monitoring Suppl (ACCU-CHEK AVIVA PLUS) w/Device KIT USE AS DIRECTED TO CHECK GLUCOSE   Calcium Carbonate-Vitamin D 600-400 MG-UNIT tablet Take 1 tablet by mouth 2 (two) times daily.   ezetimibe (ZETIA) 10 MG tablet Take 1 tablet (10 mg total) by mouth every other day.   fluticasone (FLONASE) 50 MCG/ACT nasal spray USE TWO SPRAY IN EACH NOSTRIL ONCE DAILY   losartan-hydrochlorothiazide (HYZAAR) 50-12.5 MG tablet Take 0.5 tablets by mouth daily.   metoprolol succinate (TOPROL-XL) 25 MG 24 hr tablet Take 1 tablet by mouth once daily   nabumetone (RELAFEN) 500 MG tablet TAKE 1 TABLET BY MOUTH TWICE DAILY AS NEEDED AFTER A MEAL FOR ARTHRITIS   oxybutynin (DITROPAN) 5 MG tablet Take 1 tablet by mouth 4 times daily   No facility-administered encounter medications on file as of 11/22/2021.   Patient Active Problem List   Diagnosis Date Noted   Posterior tibial tendinitis, right leg 04/21/2021   Statin myopathy 01/03/2021   Knee pain, right 01/03/2021   Osteoarthritis of both knees 07/07/2020   Heart murmur 06/03/2020   Vitamin D deficiency 10/29/2019   Grief 10/29/2019   Overactive bladder 03/09/2016   Family history of breast cancer 09/22/2015   Malignant neoplasm of upper-inner quadrant of left breast in female, estrogen receptor positive ([place]) 09/09/2015   PPD positive 10/14/2014   Bradycardia 09/17/2014   Edema 03/05/2013   Obesity, Class II, BMI 35-39.9 06/23/2012   Encounter for well adult exam with abnormal findings 06/23/2012   ALLERGIC RHINITIS 08/12/2009   Diabetes type 2, controlled ([place]) 10/01/2007   Hyperlipidemia 09/13/2007  Essential hypertension 09/13/2007   BREAST CANCER, HX OF 09/13/2007   Conditions to be addressed/monitored:  HTN and DMII  Care Plan : RN Care Manager Plan of Care  Updates made by Knox Royalty, RN since 11/22/2021 12:00 AM     Problem: Chronic Disease Management Needs   Priority: Medium     Long-Range Goal:  Development of plan of care for long term chronic disease management   Start Date: 06/16/2021  Expected End Date: 06/16/2022  Priority: Medium  Note:   Current Barriers:  Chronic Disease Management support and education needs related to HTN and DMII  RNCM Clinical Goal(s):  Patient will demonstrate ongoing adherence to prescribed treatment plan for HTN and DMII as evidenced by adherence to ADA/ carb modified heart healthy diet; adherence to prescribed medication regimen; regular exercise/ activity; contacting provider for new or worsened symptoms or questions through collaboration with RN Care manager, provider, and care team  Interventions: 1:1 collaboration with primary care provider regarding development and update of comprehensive plan of care as evidenced by provider attestation and co-signature Inter-disciplinary care team collaboration (see longitudinal plan of care) Evaluation of current treatment plan related to  self management and patient's adherence to plan as established by provider Pain assessment updated: ongoing chronic (R) ankle/ knee pain; managed by orthopedic provider Dr. Lorin Mercy; reports manageable but ongoing pain Falls assessment updated: continues to deny falls; occasionally uses cane as/ if indicated- positive reinforcement provided with encouragement to continue efforts  Diabetes:  (Status: Goal on Track (progressing): YES.) Long-Term goal Lab Results  Component Value Date   HGBA1C 6.5 07/26/2021  Assessed patient's understanding of A1c goal: <6.5% Reviewed prescribed diet with patient diabetic/ heart healthy; Counseled on importance of regular laboratory monitoring as prescribed;        Discussed plans with patient for ongoing care management follow up and provided patient with direct contact information for care management team;      Review of patient status, including review of consultants reports, relevant laboratory and other test results, and medications completed;        Re- reviewed with patient most recent A1-C value 08/09/21: 6.5;  Assessed patient's understanding of meaning/ significance of A1-C values: she verbalizes ongoing good baseline understanding of same- Reviewed individual historical A1-C trends and provided education around correlation of A1-C value to blood sugar levels at home over 3 months  Reports home glucometer "broken" and proactively verbalizes plans to obtain new prescription at time of PCP office visit: declines need to do sooner than scheduled 01/25/22 PCP office visit, stating that she was only monitoring 2-3 times per week, as her A1-C are "so good;" discussed value of knowing blood sugars at home to keep patient on track; she agrees, and prefers to get new glucometer prescription after her next A1-C, presumably which will be done 01/25/22 office visit Confirmed patient continues to stay "as active as possible" around ongoing chronic ankle/ knee pain: has had several visits with orthopedic provider, these were reviewed with patient: she reports unable to wear prescribed boot, too heavy, threw balance off, she was fearful she would fall- currently not wearing, using conservative measures for pain control, reports "manageable" Hoping to resume working out at [employer] "this week on Thursday;" encouraged patient to begin resumption of exercise program slowly, gradually, in light of ongoing chronic ankle pain: she verbalizes understanding/ agreement Confirmed patient continues to follow prescribed/ recommended diet Confirmed patient has scheduled eye/ vision exam "tomorrow"  Hypertension: (Status: Goal on Track (progressing):  YES.) Long-Term goal Last practice recorded BP readings:  BP Readings from Last 3 Encounters:  08/09/21 128/80  07/26/21 (!) 148/70  06/29/21 (!) 188/95  Most recent eGFR/CrCl:  Lab Results  Component Value Date   EGFR 70 (L) 09/09/2015    No components found for: CRCL  Evaluation of current treatment plan related to  hypertension self management and patient's adherence to plan as established by provider;   Counseled on the importance of exercise goals with target of 150 minutes per week Provided education on prescribed diet confirmed patient continues following heart healthy, low sodium, diabetic diet;  Discussed complications of poorly controlled blood pressure such as heart disease, stroke, circulatory complications, vision complications, kidney impairment, sexual dysfunction;  Screening for signs and symptoms of depression related to chronic disease state;  Assessed social determinant of health barriers;  Confirmed patient has continues taking recently prescribed BP medications-- continues to deny medication concerns; continues self-managing medications independently Confirmed patient continues to monitor/ record BP at home weekly: continues to report "they all look good;" reports blood pressure this morning of 119/80; does not routinely keep a written record af all blood pressures if they are "normal" Confirmed no recent falls; not needing to use cane at present time; continues to see orthopedic provider Lorin Mercy) for ongoing (R) knee/ ankle issues Reviewed upcoming provider appointments and confirmed patient is aware and has plans to attend as scheduled: PCP 01/25/22  Patient Goals/Self-Care Activities: As evidenced by review of EHR, collaboration with care team, and patient reporting during CCM RN CM outreach,  Patient Nina will: Take medications as prescribed  Attend all scheduled provider appointments  Call pharmacy for medication refills Call provider office for new concerns or questions  Resume monitor and write down on paper weekly blood pressures and blood sugars: please ask Dr. Jenny Reichmann to prescribe you a new blood sugar meter when you have your next office visit on January 25, 2022 Continue to remain active and exercise regularly-- but do not over-do activity since you have ongoing ankle  discomfort Continue to follow a heart healthy, low salt, low cholesterol, and low carbohydrate/ low sugar diet Keep up the excellent work at preventing falls- continue to use your cane as needed    Plan: Telephone follow up appointment with care management team member scheduled for:  Monday, February 14, 2022 at 9:45 am The patient has been provided with contact information for the care management team and has been advised to call with any health related questions or concerns  Oneta Rack, RN, BSN, [place] [phone]: direct office

## 2021-11-22 NOTE — Patient Instructions (Signed)
Visit [place], thank you for taking time to talk with me today. Please don't hesitate to contact me if I can be of assistance to you before our next scheduled telephone appointment.  Below are the goals we discussed today:  Patient Self-Care Activities: Patient Kelly Bell will: Take medications as prescribed  Attend all scheduled provider appointments  Call pharmacy for medication refills Call provider office for new concerns or questions  Resume monitor and write down on paper weekly blood pressures and blood sugars: please ask Dr. Jenny Reichmann to prescribe you a new blood sugar meter when you have your next office visit on January 25, 2022 Continue to remain active and exercise regularly-- but do not over-do activity since you have ongoing ankle discomfort Continue to follow a heart healthy, low salt, low cholesterol, and low carbohydrate/ low sugar diet Keep up the excellent work at preventing falls- continue to use your cane as needed  Our next scheduled telephone follow up visit/ appointment with care management team member is scheduled on:  ,Monday, February 14, 2022 at 9:45 am  If you need to cancel or re-schedule our visit, please call [phone] and our care guide team will be happy to assist you.   I look forward to hearing about your progress.   Oneta Rack, RN, BSN, [place] [phone]: direct office  If you are experiencing a Mental Health or [place] or need someone to talk to, please  call the Suicide and Crisis Lifeline: 988 call the [place] National Suicide Prevention Lifeline: 709-698-8793 or TTY: 404-598-1358 TTY 203-058-3369) to talk to a trained counselor call 1-800-273-TALK (toll free, 24 hour hotline) go to [facility] [address], [place] [phone]) call 911   Patient verbalizes understanding of instructions provided today and agrees to view  in [place] With Diabetes Diabetes (type 1 diabetes mellitus or type 2 diabetes mellitus) is a condition in which the body does not have enough of a hormone called insulin, or the body does not respond properly to insulin. Normally, insulin allows sugars (glucose) to enter cells in the body. With diabetes, extra glucose builds up in the blood instead of going into cells. This results in high blood glucose (hyperglycemia). How to manage lifestyle changes Managing diabetes includes medical treatments as well as lifestyle changes. If diabetes is not managed well, serious physical and emotional complications can occur. Taking good care of yourself means that you are responsible for: Monitoring glucose regularly. Eating a healthy diet. Exercising regularly. Meeting with health care providers. Taking medicines as directed. Most people feel some stress about managing their diabetes. When this stress becomes too much, it is known as diabetes-related distress. This is very common. Living with diabetes can place you at risk for diabetes distress, depression, or anxiety. These disorders can make diabetes more difficult to manage. How to recognize stress You may have diabetes distress if you: Avoid or ignore your daily diabetes care. This includes glucose testing, following a meal plan, and taking medications. Feel overwhelmed by your daily diabetes care. Experience emotional reactions such as anger, sadness, or fear related to your daily diabetes care. Feel fear or shame about not doing everything perfectly that you have been told to do. Emotional distress Symptoms of diabetes distress include: Anger about having a diagnosis of diabetes. Fear or frustration about your diagnosis and the changes you need to make to manage the condition. Being overly worried  about the care that you need or the cost of the care that you need. Feeling like you caused your condition by doing something wrong. Fear about  unpredictable fluctuations in your blood glucose, like low or high blood glucose. Feeling judged by your health care providers. Feeling very alone with the disease. Depression Having diabetes means that you are at a higher risk for depression. Your health care provider may test (screen) you for symptoms of depression. It is important to recognize symptoms and to start treatment for depression soon after it is diagnosed. The following are some symptoms of depression: Loss of interest in things that you used to enjoy. Feeling depressed much or most of the time. A change in appetite. Trouble getting to sleep or staying asleep. Feeling tired most of the day. Feeling nervous and anxious. Feeling guilty and worrying that you are a burden to others. Having thoughts of hurting yourself or feeling that you want to die. If you have any of these symptoms, more days than not, for 2 weeks or longer, you may have depression. This would be a good time to contact your health care provider. Follow these instructions at home: Managing diabetes distress The following are some ways to manage emotional distress: Learn as much as you can about diabetes and its treatment. Take one step at a time to improve your management. Meet with a certified diabetes care and education specialist. Take a class to learn how to manage your condition. Consider working with a counselor or therapist. Keep a journal of your thoughts and concerns. Accept that some things are out of your control. Talk with other people who have diabetes. It can help to talk about the distress that you feel. Find ways to manage stress that work for you. These may include art or music therapy, exercise, meditation, and hobbies. Seek support from spiritual leaders, family, and friends.  General instructions Do your best to follow your diabetes management plan. If you are struggling to follow your plan, talk with a certified diabetes care and education  specialist, or with someone else who has diabetes. They may have ideas that will help. Forgive yourself for not being perfect. Almost everyone struggles with the tasks of diabetes. Keep all follow-up visits. This is important. Where to find support Search for information and support from the American Diabetes Association: www.diabetes.org Find a certified diabetes education and care specialist. Make an appointment through the Association of Diabetes Care & Education Specialists: www.diabeteseducator.org Contact a health care provider if: You believe your diabetes is getting out of control. You are concerned you may be depressed. You think your medications are not helping control your diabetes. You are feeling overwhelmed with your diabetes. Get help right away if: You have thoughts about hurting yourself or others. If you ever feel like you may hurt yourself or others, or have thoughts about taking your own life, get help right away. You can go to your nearest emergency department or call: Your local emergency services (911 in the [place] A suicide crisis helpline, such as the [place] at (417)437-9837 or 988 in the [place]. This is open 24 hours a day. Summary Diabetes (type 1 diabetes mellitus or type 2 diabetes mellitus) is a condition in which the body does not have enough of a hormone called insulin, or the body does not respond properly to insulin. Living with diabetes puts you at risk for medical and emotional issues, such as diabetes distress, depression, and anxiety. Recognizing the symptoms  of diabetes distress and depression may help you avoid problems with your diabetes control. If you experience symptoms, it is important to discuss this with your health care provider, certified diabetes care and education specialist, or therapist. It is important to start treatment for diabetes distress and depression soon after diagnosis. Ask your health care provider to  recommend a therapist who understands both depression and diabetes. This information is not intended to replace advice given to you by your health care provider. Make sure you discuss any questions you have with your health care provider. Document Revised: 05/26/2021 Document Reviewed: 03/12/2020 Elsevier Patient Education  [place].

## 2021-12-14 ENCOUNTER — Other Ambulatory Visit: Payer: Self-pay | Admitting: Internal Medicine

## 2021-12-14 DIAGNOSIS — E1165 Type 2 diabetes mellitus with hyperglycemia: Secondary | ICD-10-CM

## 2021-12-14 DIAGNOSIS — I10 Essential (primary) hypertension: Secondary | ICD-10-CM

## 2022-01-25 ENCOUNTER — Encounter: Payer: Self-pay | Admitting: Internal Medicine

## 2022-01-25 ENCOUNTER — Ambulatory Visit: Payer: Medicare PPO | Admitting: Internal Medicine

## 2022-01-25 ENCOUNTER — Other Ambulatory Visit: Payer: Self-pay

## 2022-01-25 VITALS — BP 132/80 | HR 67 | Temp 99.0°F | Ht 65.0 in | Wt 220.0 lb

## 2022-01-25 DIAGNOSIS — I10 Essential (primary) hypertension: Secondary | ICD-10-CM | POA: Diagnosis not present

## 2022-01-25 DIAGNOSIS — E7800 Pure hypercholesterolemia, unspecified: Secondary | ICD-10-CM | POA: Diagnosis not present

## 2022-01-25 DIAGNOSIS — M1711 Unilateral primary osteoarthritis, right knee: Secondary | ICD-10-CM | POA: Diagnosis not present

## 2022-01-25 DIAGNOSIS — Z0001 Encounter for general adult medical examination with abnormal findings: Secondary | ICD-10-CM

## 2022-01-25 DIAGNOSIS — M25571 Pain in right ankle and joints of right foot: Secondary | ICD-10-CM

## 2022-01-25 DIAGNOSIS — E538 Deficiency of other specified B group vitamins: Secondary | ICD-10-CM

## 2022-01-25 DIAGNOSIS — E559 Vitamin D deficiency, unspecified: Secondary | ICD-10-CM

## 2022-01-25 DIAGNOSIS — E1165 Type 2 diabetes mellitus with hyperglycemia: Secondary | ICD-10-CM

## 2022-01-25 LAB — CBC WITH DIFFERENTIAL/PLATELET
Basophils Absolute: 0 10*3/uL (ref 0.0–0.1)
Basophils Relative: 0.7 % (ref 0.0–3.0)
Eosinophils Absolute: 0.1 10*3/uL (ref 0.0–0.7)
Eosinophils Relative: 2.9 % (ref 0.0–5.0)
HCT: 42.6 % (ref 36.0–46.0)
Hemoglobin: 14.3 g/dL (ref 12.0–15.0)
Lymphocytes Relative: 42.1 % (ref 12.0–46.0)
Lymphs Abs: 1.7 10*3/uL (ref 0.7–4.0)
MCHC: 33.7 g/dL (ref 30.0–36.0)
MCV: 93.6 fl (ref 78.0–100.0)
Monocytes Absolute: 0.2 10*3/uL (ref 0.1–1.0)
Monocytes Relative: 5.4 % (ref 3.0–12.0)
Neutro Abs: 2 10*3/uL (ref 1.4–7.7)
Neutrophils Relative %: 48.9 % (ref 43.0–77.0)
Platelets: 196 10*3/uL (ref 150.0–400.0)
RBC: 4.55 Mil/uL (ref 3.87–5.11)
RDW: 12.4 % (ref 11.5–15.5)
WBC: 4.1 10*3/uL (ref 4.0–10.5)

## 2022-01-25 LAB — HEPATIC FUNCTION PANEL
ALT: 13 U/L (ref 0–35)
AST: 16 U/L (ref 0–37)
Albumin: 4.5 g/dL (ref 3.5–5.2)
Alkaline Phosphatase: 57 U/L (ref 39–117)
Bilirubin, Direct: 0.1 mg/dL (ref 0.0–0.3)
Total Bilirubin: 0.7 mg/dL (ref 0.2–1.2)
Total Protein: 7 g/dL (ref 6.0–8.3)

## 2022-01-25 LAB — MICROALBUMIN / CREATININE URINE RATIO
Creatinine,U: 85.3 mg/dL
Microalb Creat Ratio: 0.8 mg/g (ref 0.0–30.0)
Microalb, Ur: 0.7 mg/dL (ref 0.0–1.9)

## 2022-01-25 LAB — URINALYSIS, ROUTINE W REFLEX MICROSCOPIC
Bilirubin Urine: NEGATIVE
Hgb urine dipstick: NEGATIVE
Ketones, ur: NEGATIVE
Leukocytes,Ua: NEGATIVE
Nitrite: NEGATIVE
Specific Gravity, Urine: 1.01 (ref 1.000–1.030)
Total Protein, Urine: NEGATIVE
Urine Glucose: NEGATIVE
Urobilinogen, UA: 0.2 (ref 0.0–1.0)
pH: 7.5 (ref 5.0–8.0)

## 2022-01-25 LAB — LIPID PANEL
Cholesterol: 234 mg/dL — ABNORMAL HIGH (ref 0–200)
HDL: 43.5 mg/dL (ref >39.00)
LDL Cholesterol: 155 mg/dL — ABNORMAL HIGH (ref 0–99)
NonHDL: 190.25
Total CHOL/HDL Ratio: 5
Triglycerides: 178 mg/dL — ABNORMAL HIGH (ref 0.0–149.0)
VLDL: 35.6 mg/dL (ref 0.0–40.0)

## 2022-01-25 LAB — HEMOGLOBIN A1C: Hgb A1c MFr Bld: 6.5 % (ref 4.6–6.5)

## 2022-01-25 LAB — BASIC METABOLIC PANEL
BUN: 12 mg/dL (ref 6–23)
CO2: 29 mEq/L (ref 19–32)
Calcium: 10.3 mg/dL (ref 8.4–10.5)
Chloride: 103 mEq/L (ref 96–112)
Creatinine, Ser: 0.79 mg/dL (ref 0.40–1.20)
GFR: 69.86 mL/min (ref >60.00)
Glucose, Bld: 129 mg/dL — ABNORMAL HIGH (ref 70–99)
Potassium: 4 mEq/L (ref 3.5–5.1)
Sodium: 139 mEq/L (ref 135–145)

## 2022-01-25 LAB — VITAMIN D 25 HYDROXY (VIT D DEFICIENCY, FRACTURES): VITD: 46.25 ng/mL (ref 30.00–100.00)

## 2022-01-25 LAB — TSH: TSH: 1.37 u[IU]/mL (ref 0.35–5.50)

## 2022-01-25 LAB — VITAMIN B12: Vitamin B-12: 1474 pg/mL — ABNORMAL HIGH (ref 211–911)

## 2022-01-25 NOTE — Assessment & Plan Note (Addendum)
Lab Results  ?Component Value Date  ? LDLCALC 139 (H) 07/26/2021  ? ?Mod uncontrolled as last lab, pt to continue current Zetia, for f/u lab and lower chol diet ? ?

## 2022-01-25 NOTE — Patient Instructions (Signed)
You will be contacted regarding the referral for: Dr Doran Durand for the right ankle ? ?Please see Sports medicine on the first floor for the right knee and possible cortisone shot ? ?Ok to cut back on the B12 to Mon-Wed-Fri ? ?Please continue all other medications as before, and refills have been done if requested. ? ?Please have the pharmacy call with any other refills you may need. ? ?Please continue your efforts at being more active, low cholesterol diet, and weight control. ? ?You are otherwise up to date with prevention measures today. ? ?Please keep your appointments with your specialists as you may have planned ? ?Please go to the LAB at the blood drawing area for the tests to be done ? ?You will be contacted by phone if any changes need to be made immediately.  Otherwise, you will receive a letter about your results with an explanation, but please check with MyChart first. ? ?Please remember to sign up for MyChart if you have not done so, as this will be important to you in the future with finding out test results, communicating by private email, and scheduling acute appointments online when needed. ? ?Please make an Appointment to return in 6 months, or sooner if needed ?

## 2022-01-25 NOTE — Progress Notes (Signed)
Patient ID: NORALEE DUTKO, female   DOB: 07-04-1941, 81 y.o.   MRN: [id number] ? ? ? ?     Chief Complaint:: wellness exam and right ankle pain, right knee pain, low b12 ? ?     HPI:  Kelly Bell is a 81 y.o. female here for wellness exam; plans to make her own eye appt soon; declines covid booster, shiingrix ow up to date ?        ?              Also Pt denies chest pain, increased sob or doe, wheezing, orthopnea, PND, increased LE swelling, palpitations, dizziness or syncope.   Pt denies polydipsia, polyuria, or new focal neuro s/s. Pt denies fever, wt loss, night sweats, loss of appetite, or other constitutional symptoms  Does also c/o worsening 3 mo more acute over chronic right ankle pain, modeate, intermittent, sharp, worse to walk, better to sit, nothing else makes better or worse.  No falls. Also has mild worsening right knee pain , dull with intermittent swelling but no giveaways or falls or trauma, overall new worsening x 2-3 mo, worse to walk, better to sit.   ?Wt Readings from Last 3 Encounters:  ?01/25/22 220 lb (99.8 kg)  ?08/09/21 222 lb 9.6 oz (101 kg)  ?07/26/21 223 lb (101.2 kg)  ? ?BP Readings from Last 3 Encounters:  ?01/25/22 132/80  ?08/09/21 128/80  ?07/26/21 (!) 148/70  ? ?Immunization History  ?Administered Date(s) Administered  ? Fluad Quad(high Dose 65+) 08/12/2019, 12/30/2020, 07/26/2021  ? Influenza Split 08/03/2011, 09/13/2012  ? Influenza Whole 08/12/2008, 08/12/2009, 08/19/2010  ? Influenza, High Dose Seasonal PF 09/05/2013, 09/07/2016, 09/06/2017, 09/28/2018  ? Influenza,inj,Quad PF,6+ Mos 08/18/2014, 08/07/2015  ? PFIZER(Purple Top)SARS-COV-2 Vaccination 11/15/2019, 12/12/2019, 08/26/2020  ? Pneumococcal Conjugate-13 08/18/2014  ? Pneumococcal Polysaccharide-23 09/10/2007, 07/07/2020  ? Td 10/21/2008  ? Tdap 05/09/2019  ? Zoster, Live 02/04/2010  ? ?There are no preventive care reminders to display for this patient. ? ?  ? ?Past Medical History:  ?Diagnosis Date  ? Allergy   ?  Arthritis   ? knees  ? Breast cancer ([place])   ? Right  ? HTN (hypertension)   ? Hyperlipidemia   ? Hypertension   ? Irritable bladder   ? Knee pain   ? Bilateral  ? SVD (spontaneous vaginal delivery)   ? x 2  ? Type II or unspecified type diabetes mellitus without mention of complication, not stated as uncontrolled   ? ?Past Surgical History:  ?Procedure Laterality Date  ? ABDOMINAL HYSTERECTOMY  1975  ? COLONOSCOPY  05/10/05, 2011  ? hx polyps  ? MASTECTOMY PARTIAL / LUMPECTOMY W/ AXILLARY LYMPHADENECTOMY  07/1986  ? MASTECTOMY W/ SENTINEL NODE BIOPSY Left 10/28/2015  ? Procedure: LEFT MASTECTOMY WITH SENTINEL LYMPH NODE BIOPSY;  Surgeon: Stark Klein, MD;  Location: [facility];  Service: General;  Laterality: Left;  ? SHOULDER ARTHROSCOPY  2000  ? ? reports that she has never smoked. She has never used smokeless tobacco. She reports current alcohol use. She reports that she does not use drugs. ?family history includes Breast cancer in her sister; Breast cancer (age of onset: 56) in her sister; Cancer in her brother and cousin; Corneal Dystrophy in her son; Diabetes (age of onset: 73) in her father and mother; Esophageal cancer in her maternal grandfather; Healthy in her daughter; Heart disease in her father; Heart failure in her father; Pancreatic cancer in her mother; Rectal cancer in her  maternal grandfather; Stomach cancer in her maternal grandfather. ?Allergies  ?Allergen Reactions  ? Hydrocodone-Acetaminophen Other (See Comments)  ?  Mental confusion  ? Metformin And Related Diarrhea  ? Pravastatin Other (See Comments)  ?  Severe muscle cramps.  ? Lisinopril   ?  cough  ? ?Current Outpatient Medications on File Prior to Visit  ?Medication Sig Dispense Refill  ? ACCU-CHEK AVIVA PLUS test strip USE TO CHECK BLOOD SUGAR ONCE DAILY. Dx: E11.9 100 each 0  ? aspirin 325 MG tablet Take 325 mg by mouth daily.    ? Blood Glucose Monitoring Suppl (ACCU-CHEK AVIVA PLUS) w/Device KIT USE AS DIRECTED TO CHECK  GLUCOSE 1 kit 0  ? Calcium Carbonate-Vitamin D 600-400 MG-UNIT tablet Take 1 tablet by mouth 2 (two) times daily.    ? ezetimibe (ZETIA) 10 MG tablet Take 1 tablet (10 mg total) by mouth every other day. 45 tablet 3  ? fluticasone (FLONASE) 50 MCG/ACT nasal spray USE TWO SPRAY IN EACH NOSTRIL ONCE DAILY 48 g 1  ? losartan-hydrochlorothiazide (HYZAAR) 50-12.5 MG tablet Take 0.5 tablets by mouth daily. 45 tablet 3  ? metoprolol succinate (TOPROL-XL) 25 MG 24 hr tablet Take 1 tablet by mouth once daily 90 tablet 1  ? nabumetone (RELAFEN) 500 MG tablet TAKE 1 TABLET BY MOUTH TWICE DAILY AS NEEDED AFTER A MEAL FOR ARTHRITIS 60 tablet 2  ? oxybutynin (DITROPAN) 5 MG tablet Take 1 tablet by mouth 4 times daily 360 tablet 3  ? ?No current facility-administered medications on file prior to visit.  ? ?     ROS:  All others reviewed and negative. ? ?Objective  ? ?     PE:  BP 132/80 (BP Location: Left Arm, Patient Position: Sitting, Cuff Size: Large)   Pulse 67   Temp 99 ?F (37.2 ?C) (Oral)   Ht 5' 5"  (1.651 m)   Wt 220 lb (99.8 kg)   SpO2 98%   BMI 36.61 kg/m?  ? ?              Constitutional: Pt appears in NAD ?              HENT: Head: NCAT.  ?              Right Ear: External ear normal.   ?              Left Ear: External ear normal.  ?              Eyes: . Pupils are equal, round, and reactive to light. Conjunctivae and EOM are normal ?              Nose: without d/c or deformity ?              Neck: Neck supple. Gross normal ROM ?              Cardiovascular: Normal rate and regular rhythm.   ?              Pulmonary/Chest: Effort normal and breath sounds without rales or wheezing.  ?              Abd:  Soft, NT, ND, + BS, no organomegaly ?              Right knee with small effusion and crepitus ?              Right ankle with degenerative changes ?  Neurological: Pt is alert. At baseline orientation, motor grossly intact ?              Skin: Skin is warm. No rashes, no other new lesions, LE edema -  none ?              Psychiatric: Pt behavior is normal without agitation  ? ?Micro: none ? ?Cardiac tracings I have personally interpreted today:  none ? ?Pertinent Radiological findings (summarize): none  ? ?Lab Results  ?Component Value Date  ? WBC 4.1 01/25/2022  ? HGB 14.3 01/25/2022  ? HCT 42.6 01/25/2022  ? PLT 196.0 01/25/2022  ? GLUCOSE 129 (H) 01/25/2022  ? CHOL 234 (H) 01/25/2022  ? TRIG 178.0 (H) 01/25/2022  ? HDL 43.50 01/25/2022  ? LDLDIRECT 111.5 01/01/2007  ? LDLCALC 155 (H) 01/25/2022  ? ALT 13 01/25/2022  ? AST 16 01/25/2022  ? NA 139 01/25/2022  ? K 4.0 01/25/2022  ? CL 103 01/25/2022  ? CREATININE 0.79 01/25/2022  ? BUN 12 01/25/2022  ? CO2 29 01/25/2022  ? TSH 1.37 01/25/2022  ? HGBA1C 6.5 01/25/2022  ? MICROALBUR 0.7 01/25/2022  ? ?Assessment/Plan:  ?TASHI ANDUJO is a 81 y.o. Black or African American [2] female with  has a past medical history of Allergy, Arthritis, Breast cancer ([place]), HTN (hypertension), Hyperlipidemia, Hypertension, Irritable bladder, Knee pain, SVD (spontaneous vaginal delivery), and Type II or unspecified type diabetes mellitus without mention of complication, not stated as uncontrolled. ? ?Vitamin D deficiency ?Last vitamin D ?Lab Results  ?Component Value Date  ? VD25OH 62.33 07/26/2021  ? ?Stable, cont oral replacement ? ? ?Hyperlipidemia ?Lab Results  ?Component Value Date  ? LDLCALC 139 (H) 07/26/2021  ? ?Mod uncontrolled as last lab, pt to continue current Zetia, for f/u lab and lower chol diet ? ? ?Encounter for well adult exam with abnormal findings ?Age and sex appropriate education and counseling updated with regular exercise and diet ?Referrals for preventative services - pt to call for eye appt soon ?Immunizations addressed - declines covid booster, shingrix ?Smoking counseling  - none needed ?Evidence for depression or other mood disorder - none significant ?Most recent labs reviewed. ?I have personally reviewed and have noted: ?1) the patient's medical and  social history ?2) The patient's current medications and supplements ?3) The patient's height, weight, and BMI have been recorded in the chart ? ? ?Essential hypertension ?BP Readings from Last 3 Encounters:  ?03/14/

## 2022-01-25 NOTE — Assessment & Plan Note (Signed)
Last vitamin D ?Lab Results  ?Component Value Date  ? VD25OH 62.33 07/26/2021  ? ?Stable, cont oral replacement ? ?

## 2022-01-29 ENCOUNTER — Encounter: Payer: Self-pay | Admitting: Internal Medicine

## 2022-01-29 DIAGNOSIS — M25571 Pain in right ankle and joints of right foot: Secondary | ICD-10-CM | POA: Insufficient documentation

## 2022-01-29 DIAGNOSIS — M1711 Unilateral primary osteoarthritis, right knee: Secondary | ICD-10-CM | POA: Insufficient documentation

## 2022-01-29 DIAGNOSIS — E538 Deficiency of other specified B group vitamins: Secondary | ICD-10-CM | POA: Insufficient documentation

## 2022-01-29 NOTE — Assessment & Plan Note (Signed)
BP Readings from Last 3 Encounters:  ?01/25/22 132/80  ?08/09/21 128/80  ?07/26/21 (!) 148/70  ? ?Stable, pt to continue medical treatment hyzaar toprol ? ?

## 2022-01-29 NOTE — Assessment & Plan Note (Signed)
With milder worsening recent, ? Related to right ankle worsening - for sport med referral as well ?

## 2022-01-29 NOTE — Assessment & Plan Note (Signed)
With more acute on chronic worsening, for ortho referral Dr Doran Durand ?

## 2022-01-29 NOTE — Assessment & Plan Note (Signed)
Age and sex appropriate education and counseling updated with regular exercise and diet ?Referrals for preventative services - pt to call for eye appt soon ?Immunizations addressed - declines covid booster, shingrix ?Smoking counseling  - none needed ?Evidence for depression or other mood disorder - none significant ?Most recent labs reviewed. ?I have personally reviewed and have noted: ?1) the patient's medical and social history ?2) The patient's current medications and supplements ?3) The patient's height, weight, and BMI have been recorded in the chart ? ?

## 2022-01-29 NOTE — Assessment & Plan Note (Signed)
Overcontrolled, ok to reduce to mon-wed-fri oral supplement ?

## 2022-01-29 NOTE — Assessment & Plan Note (Signed)
Lab Results  ?Component Value Date  ? HGBA1C 6.5 01/25/2022  ? ?Stable, pt to continue current medical treatment  - diet ? ?

## 2022-02-07 NOTE — Progress Notes (Signed)
? ? ?  Subjective:   ? ?CC: R knee and R ankle pain ? ?I, Wendy Poet, LAT, ATC, am serving as scribe for Dr. Lynne Leader. ? ?HPI: Pt is an 81 y/o female c/o R knee and R ankle pain.  She has a hx of B breast CA w/ L mastectomy and R lumpectomy. Pt notes that the R ankle>R knee. Has seen Dr. Lorin Mercy previously for this (04/21/21). Pt reports R knee will only bother her intermittently and her main concern is her R ankle. ? ?R ankle pain: Chronic but worsening over last 3 months.  Locates pain to the posterior and medial aspect of the R ankle.  Has seen Dr. Lorin Mercy for this previously (06/29/21). ?-Swelling: yes ?-Aggravating factors: walking/weight-bearing activity;  ?-Treatments tried: CAM walker; ankle brace; Meloxicam;  ? ?Diagnostic testing: R knee XR- 04/21/21, 11/29/17; R ankle XR- 04/21/21 ? ?Pertinent review of Systems: No fevers or chills ? ?Relevant historical information: Diabetes.  History of breast cancer. ? ? ?Objective:   ? ?Vitals:  ? 02/08/22 1051  ?BP: (!) 164/108  ?Pulse: 82  ?SpO2: 95%  ? ?General: Well Developed, well nourished, and in no acute distress.  ? ?MSK: Right ankle: Visible nodule at posterior ankle near the Achilles tendon insertion. ?Tender palpation just superior to the nodule. ?Normal foot and ankle motion. ?Pain with resisted foot plantarflexion. ?Pulses capillary fill and sensation are intact distally. ? ?Lab and Radiology Results ? ?Diagnostic Limited MSK Ultrasound of: Right ankle ?Achilles tendon is intact. ?Increased vascular activity present within the tendon near the insertion onto the calcaneus consistent with chronic Achilles tendinitis. ?Hyperechoic change at the insertion site consistent with heel spur seen on x-ray. ?Posterior tibialis tendon normal-appearing ?Impression: Chronic Achilles tendinitis ? ?X-ray images right ankle obtained on April 21, 2021 ?Posterior calcaneal heel spur at Achilles tendon insertion site present. ?Right ankle DJD present. ? ? ? ?Impression and  Recommendations:   ? ?Assessment and Plan: ?81 y.o. female with right posterior ankle pain at Achilles tendon insertion consistent with chronic Achilles tendinitis.  This is also consistent in appearance with her ultrasound findings.  She has had some trials of conservative management already last year.  Plan to modify and add eccentric exercises.  Also recommend continued heel lifts and Voltaren gel.  We will recheck back in a month.  If not improved consider adding nitroglycerin patch protocol.  Would like to avoid this medication for now given its potential risk for side effects with her.. ? ?PDMP not reviewed this encounter. ?Orders Placed This Encounter  ?Procedures  ? [place] LIMITED JOINT SPACE STRUCTURES LOW RIGHT(NO LINKED CHARGES)  ?  Order Specific Question:   Reason for Exam (SYMPTOM  OR DIAGNOSIS REQUIRED)  ?  Answer:   right ankle pain  ?  Order Specific Question:   Preferred imaging location?  ?  Answer:   [place]  ? ?No orders of the defined types were placed in this encounter. ? ? ?Discussed warning signs or symptoms. Please see discharge instructions. Patient expresses understanding. ? ? ?The above documentation has been reviewed and is accurate and complete Lynne Leader, M.D. ? ?

## 2022-02-08 ENCOUNTER — Ambulatory Visit: Payer: Medicare PPO | Admitting: Family Medicine

## 2022-02-08 ENCOUNTER — Ambulatory Visit: Payer: Self-pay

## 2022-02-08 ENCOUNTER — Other Ambulatory Visit: Payer: Self-pay

## 2022-02-08 VITALS — BP 164/108 | HR 82 | Wt 221.8 lb

## 2022-02-08 DIAGNOSIS — M7661 Achilles tendinitis, right leg: Secondary | ICD-10-CM

## 2022-02-08 DIAGNOSIS — M76821 Posterior tibial tendinitis, right leg: Secondary | ICD-10-CM

## 2022-02-08 NOTE — Patient Instructions (Addendum)
Thank you for coming in today.  ? ?Please use Voltaren gel (Generic Diclofenac Gel) up to 4x daily for pain as needed.  This is available over-the-counter as both the name brand Voltaren gel and the generic diclofenac gel.  ? ?Please complete the exercises that the athletic trainer went over with you:  View at [email or web address]-[email or web address] using code: [id number] ? ?Recheck back in 1 month ?

## 2022-02-14 ENCOUNTER — Ambulatory Visit (INDEPENDENT_AMBULATORY_CARE_PROVIDER_SITE_OTHER): Payer: Medicare PPO | Admitting: *Deleted

## 2022-02-14 DIAGNOSIS — E119 Type 2 diabetes mellitus without complications: Secondary | ICD-10-CM

## 2022-02-14 DIAGNOSIS — I10 Essential (primary) hypertension: Secondary | ICD-10-CM

## 2022-02-14 NOTE — Chronic Care Management (AMB) (Signed)
?Chronic Care Management  ? ?CCM RN Visit Note ? ?02/14/2022 ?Name: Kelly Bell MRN: [id number] DOB: [birth year] ? ?Subjective: ?Kelly Bell is a 81 y.o. year old female who is a primary care patient of Biagio Borg, MD. The care management team was consulted for assistance with disease management and care coordination needs.   ? ?Engaged with patient by telephone for follow up visit in response to provider referral for case management and/or care coordination services.  ? ?Consent to Services:  ?The patient was given information about Chronic Care Management services, agreed to services, and gave verbal consent prior to initiation of services.  Please see initial visit note for detailed documentation.  ?Patient agreed to services and verbal consent obtained.  ? ?Assessment: Review of patient past medical history, allergies, medications, health status, including review of consultants reports, laboratory and other test data, was performed as part of comprehensive evaluation and provision of chronic care management services.  ? ?SDOH (Social Determinants of Health) assessments and interventions performed:  ?SDOH Interventions   ? ?Flowsheet Row Most Recent Value  ?SDOH Interventions   ?Food Insecurity Interventions Intervention Not Indicated  [denies food insecurity]  ?Housing Interventions Intervention Not Indicated  ?Transportation Interventions Intervention Not Indicated  [continues to drive self]  ? ?  ? ?CCM Care Plan ? ?Allergies  ?Allergen Reactions  ? Hydrocodone-Acetaminophen Other (See Comments)  ?  Mental confusion  ? Metformin And Related Diarrhea  ? Pravastatin Other (See Comments)  ?  Severe muscle cramps.  ? Lisinopril   ?  cough  ? ?Outpatient Encounter Medications as of 02/14/2022  ?Medication Sig  ? ACCU-CHEK AVIVA PLUS test strip USE TO CHECK BLOOD SUGAR ONCE DAILY. Dx: E11.9  ? aspirin 325 MG tablet Take 325 mg by mouth daily.  ? Blood Glucose Monitoring Suppl (ACCU-CHEK AVIVA PLUS) w/Device KIT USE AS  DIRECTED TO CHECK GLUCOSE  ? Calcium Carbonate-Vitamin D 600-400 MG-UNIT tablet Take 1 tablet by mouth 2 (two) times daily.  ? ezetimibe (ZETIA) 10 MG tablet Take 1 tablet (10 mg total) by mouth every other day.  ? fluticasone (FLONASE) 50 MCG/ACT nasal spray USE TWO SPRAY IN EACH NOSTRIL ONCE DAILY  ? losartan-hydrochlorothiazide (HYZAAR) 50-12.5 MG tablet Take 0.5 tablets by mouth daily.  ? metoprolol succinate (TOPROL-XL) 25 MG 24 hr tablet Take 1 tablet by mouth once daily  ? nabumetone (RELAFEN) 500 MG tablet TAKE 1 TABLET BY MOUTH TWICE DAILY AS NEEDED AFTER A MEAL FOR ARTHRITIS  ? oxybutynin (DITROPAN) 5 MG tablet Take 1 tablet by mouth 4 times daily  ? ?No facility-administered encounter medications on file as of 02/14/2022.  ? ?Patient Active Problem List  ? Diagnosis Date Noted  ? Right ankle pain 01/29/2022  ? Arthritis of right knee 01/29/2022  ? B12 deficiency 01/29/2022  ? Posterior tibial tendinitis, right leg 04/21/2021  ? 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 Knee pain, right 01/03/2021  ? Osteoarthritis of both knees 07/07/2020  ? Heart murmur 06/03/2020  ? Vitamin D deficiency 10/29/2019  ? Grief 10/29/2019  ? Overactive bladder 03/09/2016  ? Family history of breast cancer 09/22/2015  ? Malignant neoplasm of upper-inner quadrant of left breast in female, estrogen receptor positive ([place]) 09/09/2015  ? 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 Bradycardia 09/17/2014  ? Edema 03/05/2013  ? Obesity, Class II, BMI 35-39.9 06/23/2012  ? Encounter for well adult exam with abnormal findings 06/23/2012  ? ALLERGIC RHINITIS 08/12/2009  ? Diabetes type 2, controlled ([place]) 10/01/2007  ?  Hyperlipidemia 09/13/2007  ? Essential hypertension 09/13/2007  ? BREAST CANCER, HX OF 09/13/2007  ? ?Conditions to be addressed/monitored:  HTN and DMII ? ?Care Plan : RN Care Manager Plan of Care  ?Updates made by Knox Royalty, RN since 02/14/2022 12:00 AM  ?  ? ?Problem: Chronic Disease Management Needs   ?Priority: Medium  ?  ? ?Long-Range Goal:  Ongoing adherence to established plan of care for long term chronic disease management   ?Start Date: 06/16/2021  ?Expected End Date: 06/16/2022  ?Priority: Medium  ?Note:   ?Current Barriers:  ?Chronic Disease Management support and education needs related to HTN and DMII ? ?RNCM Clinical Goal(s):  ?Patient will demonstrate ongoing adherence to prescribed treatment plan for HTN and DMII as evidenced by adherence to ADA/ carb modified heart healthy diet; adherence to prescribed medication regimen; regular exercise/ activity; contacting provider for new or worsened symptoms or questions through collaboration with RN Care manager, provider, and care team ? ?Interventions: ?1:1 collaboration with primary care provider regarding development and update of comprehensive plan of care as evidenced by provider attestation and co-signature ?Inter-disciplinary care team collaboration (see longitudinal plan of care) ?Evaluation of current treatment plan related to  self management and patient's adherence to plan as established by provider ?Review of patient status, including review of consultants reports, relevant laboratory and other test results, and medications completed ?SDOH updated: no new/ unmet concerns identified ?Pain assessment updated: continues to report ongoing chronic (R) UE/ knee- ankle pain due to OA; patient continues to use topical and OTC medications, which she reports "helps;" continues attending sports medicine provider office visit regularly; reports pain is overall well managed ?Falls assessment updated: she continues to deny new/ recent falls x 12 months- continues using cane as needed due to ongoing chronic (R) UE pain/ discomfort, for fall prevention;  positive reinforcement provided with encouragement to continue efforts at fall prevention; previously provided education around fall risks/ prevention reinforced ?Reviewed recent PCP office visit 01/25/22 ?Confirms no medication changes; verbalizes good  understanding of post-office visit instructions ?Reports "forgot" to ask Dr. Jenny Reichmann for new glucometer Rx-- I will request from PCP today ?Medications discussed: reports continues to independently self-manage and denies current concerns/ issues/ questions around medications; endorses adherence to taking all medications as prescribed ?Reviewed upcoming scheduled provider appointments: 03/16/22- sports medicine provider; patient confirms is aware of all and has plans to attend as scheduled ?Discussed plans with patient for ongoing care management follow up and provided patient with direct contact information for care management team    ? ?Diabetes:  (Status: 02/14/22: Goal on Track (progressing): YES.) Long-Term goal ?Lab Results  ?Component Value Date  ? HGBA1C 6.5 01/25/2022  ?Assessed patient's understanding of A1c goal: <6.5% ?Reviewed prescribed diet with patient diabetic/ heart healthy; ?Counseled on importance of regular laboratory monitoring as prescribed;        ?Re- reviewed with patient most recent A1-C value: 6.5 (01/25/22)-- this is right where she was 08/09/21: positive reinforcement provided with encouragement to continue efforts at staying at A1-C goal ?Has not been monitoring/ recording blood sugars at home: forgot to ask PCP for new Rx for glucometer-- will request today ?Confirmed patient continues to stay "as active as possible" around ongoing chronic ankle/ knee pain: this was encouraged; continues to report she is hoping to resume working out at [employer] "soon" ?Confirmed patient continues to follow prescribed/ recommended diet ?Confirmed patient attended annual eye/ vision exam as scheduled 11/23/21- she denies new concerns/ issues/ problems; states "  they told me my eyes looked good" ? ?Hypertension: (Status: 02/14/22: Goal on Track (progressing): YES.) Long-Term goal ?Last practice recorded BP readings:  ?BP Readings from Last 3 Encounters:  ?02/08/22 (!) 164/108  ?01/25/22 132/80  ?08/09/21 128/80  ?Most  recent eGFR/CrCl:  ?Lab Results  ?Component Value Date  ? EGFR 70 (L) 09/09/2015  ?  No components found for: CRCL ? ?Evaluation of current treatment plan related to hypertension self management and pat

## 2022-02-14 NOTE — Patient Instructions (Signed)
Visit Information ? ?Kelly Bell, thank you for taking time to talk with me today. Please don't hesitate to contact me if I can be of assistance to you before our next scheduled telephone appointment ? ?I have asked Dr. Jenny Reichmann to prescribe a new blood sugar meter for you-- if he agrees to do so, your pharmacy will contact you when it is ready for pick up ? ?Below are the goals we discussed today:  ?Patient Self-Care Activities: ?Patient Kelly Bell will: ?Take medications as prescribed  ?Attend all scheduled provider appointments  ?Call pharmacy for medication refills ?Call provider office for new concerns or questions  ?Resume checking and writing down on paper weekly blood pressures and blood sugars: I have asked Dr. Jenny Reichmann to prescribe you a new blood sugar meter  ?Continue to remain active and exercise regularly-- but do not over-do activity since you have ongoing ankle discomfort ?Continue to follow a heart healthy, low salt, low cholesterol, and low carbohydrate/ low sugar diet ?Keep up the excellent work at preventing falls- continue to use your cane as needed ? ?Our next scheduled telephone follow up visit/ appointment is scheduled on:   Tuesday, May 24, 2022 at 9:45 am- This is a PHONE [place] appointment ? ?If you need to cancel or re-schedule our visit, please call [phone] and our care guide team will be happy to assist you. ?  ?I look forward to hearing about your progress. ?  ?Oneta Rack, RN, BSN, CCRN Alumnus ?[place] ?[phone]: direct office ? ?If you are experiencing a Mental Health or [place] or need someone to talk to, please  ?call the Suicide and Crisis Lifeline: 988 ?call the [place] National Suicide Prevention Lifeline: 8673515917 or TTY: 531-771-9808 TTY 314-291-9197) to talk to a trained counselor ?call 1-800-273-TALK (toll free, 24 hour hotline) ?go to [facility] [address], [place]  [phone]) ?call 911  ? ?Patient verbalizes understanding of instructions and care plan provided today and agrees to view in [place]. Active MyChart status confirmed with patient ? ?Exercise Information for Aging Adults ?Staying physically active is important as you age. Physical activity and exercise can help in maintaining quality of life, health, physical function, and reducing falls. The four types of exercises that are best for older adults are endurance, strength, balance, and flexibility. Contact your health care provider before you start any exercise routine. Ask your health care provider what activities are safe for you. ?What are the risks? ?Risks associated with exercising include: ?Overdoing it. This may lead to sore muscles or fatigue. ?Falls. ?Injuries. ?Dehydration. ?How to do these exercises ?Endurance exercises ?Endurance (aerobic) exercises raise your breathing rate and heart rate. Increasing your endurance helps you do everyday tasks and stay healthy. By improving the health of your body system that includes your heart, lungs, and blood vessels (circulatory system), you may also delay or prevent diseases such as heart disease, diabetes, and weak bones (osteoporosis). Types of endurance exercises include: ?Sports. ?Indoor activities, such as using gym equipment, doing water aerobics, or dancing. ?Outdoor activities, such as biking or jogging. ?Tasks around the house, such as gardening, yard work, and heavy household chores like cleaning. ?Walking, such as hiking or walking around your neighborhood. ?When doing endurance exercises, make sure you: ?Are aware of your surroundings. ?Use safety equipment as directed. ?Dress in layers when exercising outdoors. ?Drink plenty of water to stay well hydrated. ?Build up endurance slowly. Start with 10 minutes  at a time, and gradually build up to doing 30 minutes at a time. Unless your health care provider gave you different instructions, aim to exercise  for a total of 150 minutes a week. Spread out that time so you are working on endurance 3 or more days a week. ?Strength exercises ?Lifting, pulling, or pushing weights helps to strengthen muscles. Having stronger muscles makes it easier to do everyday activities, such as getting up from a chair, climbing stairs, carrying groceries, and playing with grandchildren. Strength exercises include arm and leg exercises that may be done: ?With weights. ?Without weights (using your own body weight). ?With a resistance band. ?When doing strength exercises: ?Move smoothly and steadily. Do not suddenly thrust or jerk the weights, the resistance band, or your body. ?Start with no weights or with light weights, and gradually add more weight over time. Eventually, aim to use weights that are hard or very hard for you to lift. This means that you are able to do 8 repetitions with the weight, and the last few repetitions are very challenging. ?Lift or push weights into position for 3 seconds, hold the position for 1 second, and then take 3 seconds to return to your starting position. ?Breathe out (exhale) during difficult movements, like lifting or pushing weights. Breathe in (inhale) to relax your muscles before the next repetition. ?Consider alternating arms or legs, especially when you first start strength exercises. ?Expect some slight muscle soreness after each session. ?Do strength exercises on 2 or more days a week, for 30 minutes at a time. Avoid exercising the same muscle groups two days in a row. For example, if you work on your leg muscles one day, work on your arm muscles the next day. When you can do two sets of 10-15 repetitions with a certain weight, increase the amount of weight. ?Balance exercises ?Balance exercises can help to prevent falls. Balance exercises include: ?Standing on one foot. ?Heel-to-toe walk. ?Balance walk. ?Tai chi. ?Make sure you have something sturdy to hold onto while doing balance exercises,  such as a sturdy chair. As your balance improves, challenge yourself by holding on to the chair with one hand instead of two, and then with no hands. Trying exercises with your eyes closed also challenges your balance, but be sure to have a sturdy surface (like a countertop) close by in case you need it. ?Do balance exercises as often as you want, or as often as directed by your health care provider. ?Flexibility exercises ?Flexibility exercises improve how far you can bend, straighten, move, or rotate parts of your body (range of motion). These exercises also help you do everyday activities such as getting dressed or reaching for objects. Flexibility exercises include stretching different parts of the body, and they may be done in a standing or seated position or on the floor. ?When stretching, make sure you: ?Keep a slight bend in your arms and legs. Avoid completely straightening ("locking") your joints. ?Do not stretch so far that you feel pain. You should feel a mild stretching feeling. You may try stretching farther as you become more flexible over time. ?Relax and breathe between stretches. ?Hold on to something sturdy for balance as needed. ?Hold each stretch for 10-30 seconds. Repeat each stretch 3-5 times. ?General safety tips ?Exercise in well-lit areas. ?Do not hold your breath during exercises or stretches. ?Warm up before exercising, and cool down after exercising. This can help prevent injury. ?Drink plenty of water during exercise or any activity that  makes you sweat. ?If you are not sure if an exercise is safe for you, or you are not sure how to do an exercise, talk with your health care provider. This is especially important if you have had surgery on muscles, bones, or joints (orthopedic surgery). ?Where to find more information ?You can find more information about exercise for older adults from: ?Your local health department, fitness center, or community center. These facilities may have programs  for aging adults. ?[employer] on Aging: http://kim-miller.com/ ?[employer] on Aging: www.ncoa.org ?Summary ?Staying physically active is important as you age. ?Doing endurance, strength, balance, an

## 2022-03-13 DIAGNOSIS — E119 Type 2 diabetes mellitus without complications: Secondary | ICD-10-CM

## 2022-03-13 DIAGNOSIS — I10 Essential (primary) hypertension: Secondary | ICD-10-CM

## 2022-03-15 ENCOUNTER — Ambulatory Visit: Payer: Medicare PPO | Admitting: Family Medicine

## 2022-04-25 ENCOUNTER — Ambulatory Visit: Payer: Medicare PPO | Admitting: Family Medicine

## 2022-05-06 ENCOUNTER — Other Ambulatory Visit: Payer: Self-pay | Admitting: Internal Medicine

## 2022-05-24 ENCOUNTER — Ambulatory Visit (INDEPENDENT_AMBULATORY_CARE_PROVIDER_SITE_OTHER): Payer: Medicare PPO | Admitting: *Deleted

## 2022-05-24 DIAGNOSIS — I10 Essential (primary) hypertension: Secondary | ICD-10-CM

## 2022-05-24 DIAGNOSIS — E119 Type 2 diabetes mellitus without complications: Secondary | ICD-10-CM

## 2022-05-24 NOTE — Chronic Care Management (AMB) (Signed)
Chronic Care Management   CCM RN Visit Note  05/24/2022 Name: [name] MRN: [id number] DOB: 1941/03/08  Subjective: Kelly Bell is a 81 y.o. year old female who is a primary care patient of Biagio Borg, MD. The care management team was consulted for assistance with disease management and care coordination needs.    Engaged with patient by telephone for follow up visit/ CCM RN CM case closure in response to provider referral for case management and/or care coordination services.   Consent to Services:  The patient was given information about Chronic Care Management services, agreed to services, and gave verbal consent prior to initiation of services.  Please see initial visit note for detailed documentation.  Patient agreed to services and verbal consent obtained.   Assessment: Review of patient past medical history, allergies, medications, health status, including review of consultants reports, laboratory and other test data, was performed as part of comprehensive evaluation and provision of chronic care management services.   SDOH (Social Determinants of Health) assessments and interventions performed:  SDOH Interventions    Flowsheet Row Most Recent Value  SDOH Interventions   Food Insecurity Interventions Intervention Not Indicated  [continues to deny food insecurity]  Transportation Interventions Intervention Not Indicated  [patient continues to drive self]     CCM Care Plan  Allergies  Allergen Reactions   Hydrocodone-Acetaminophen Other (See Comments)    Mental confusion   Metformin And Related Diarrhea   Pravastatin Other (See Comments)    Severe muscle cramps.   Lisinopril     cough   Outpatient Encounter Medications as of 05/24/2022  Medication Sig   ACCU-CHEK AVIVA PLUS test strip USE TO CHECK BLOOD SUGAR ONCE DAILY. Dx: E11.9   aspirin 325 MG tablet Take 325 mg by mouth daily.   Blood Glucose Monitoring Suppl (ACCU-CHEK AVIVA PLUS) w/Device KIT USE AS  DIRECTED TO CHECK GLUCOSE   Calcium Carbonate-Vitamin D 600-400 MG-UNIT tablet Take 1 tablet by mouth 2 (two) times daily.   ezetimibe (ZETIA) 10 MG tablet Take 1 tablet (10 mg total) by mouth every other day.   fluticasone (FLONASE) 50 MCG/ACT nasal spray USE TWO SPRAY IN EACH NOSTRIL ONCE DAILY   losartan-hydrochlorothiazide (HYZAAR) 50-12.5 MG tablet Take 0.5 tablets by mouth daily.   metoprolol succinate (TOPROL-XL) 25 MG 24 hr tablet Take 1 tablet by mouth once daily   nabumetone (RELAFEN) 500 MG tablet TAKE 1 TABLET BY MOUTH TWICE DAILY AS NEEDED AFTER A MEAL FOR ARTHRITIS   oxybutynin (DITROPAN) 5 MG tablet Take 1 tablet by mouth 4 times daily   No facility-administered encounter medications on file as of 05/24/2022.   Patient Active Problem List   Diagnosis Date Noted   Right ankle pain 01/29/2022   Arthritis of right knee 01/29/2022   B12 deficiency 01/29/2022   Posterior tibial tendinitis, right leg 04/21/2021   Statin myopathy 01/03/2021   Knee pain, right 01/03/2021   Osteoarthritis of both knees 07/07/2020   Heart murmur 06/03/2020   Vitamin D deficiency 10/29/2019   Grief 10/29/2019   Overactive bladder 03/09/2016   Family history of breast cancer 09/22/2015   Malignant neoplasm of upper-inner quadrant of left breast in female, estrogen receptor positive ([place]) 09/09/2015   PPD positive 10/14/2014   Bradycardia 09/17/2014   Edema 03/05/2013   Obesity, Class II, BMI 35-39.9 06/23/2012   Encounter for well adult exam with abnormal findings 06/23/2012   ALLERGIC RHINITIS 08/12/2009   Diabetes type 2, controlled ([place]) 10/01/2007  Hyperlipidemia 09/13/2007   Essential hypertension 09/13/2007   BREAST CANCER, HX OF 09/13/2007   Conditions to be addressed/monitored:  HTN and DMII  Care Plan : RN Care Manager Plan of Care  Updates made by Knox Royalty, RN since 05/24/2022 12:00 AM     Problem: Chronic Disease Management Needs   Priority: Medium     Long-Range  Goal: Ongoing adherence to established plan of care for long term chronic disease management   Start Date: 06/16/2021  Expected End Date: 06/16/2022  Priority: Medium  Note:   Current Barriers:  Chronic Disease Management support and education needs related to HTN and DMII  RNCM Clinical Goal(s):  Patient will demonstrate ongoing adherence to prescribed treatment plan for HTN and DMII as evidenced by adherence to ADA/ carb modified heart healthy diet; adherence to prescribed medication regimen; regular exercise/ activity; contacting provider for new or worsened symptoms or questions through collaboration with RN Care manager, provider, and care team  Interventions: 1:1 collaboration with primary care provider regarding development and update of comprehensive plan of care as evidenced by provider attestation and co-signature Inter-disciplinary care team collaboration (see longitudinal plan of care) Evaluation of current treatment plan related to  self management and patient's adherence to plan as established by provider Review of patient status, including review of consultants reports, relevant laboratory and other test results, and medications completed SDOH updated: no new/ unmet concerns identified Falls assessment updated: she continues to deny new/ recent falls x 12 months- continues using cane as needed due to ongoing chronic (R) UE/ LE pain/ discomfort, for fall prevention;  positive reinforcement provided with encouragement to continue efforts at fall prevention; previously provided education around fall risks/ prevention reinforced Medications discussed: reports continues to independently self-manage and denies current concerns/ issues/ questions around medications; endorses adherence to taking all medications as prescribed Reviewed upcoming scheduled provider appointments: none noted-- encouraged patient to consider scheduling next PCP office visit, advised to see PCP again in mid-September;  reports she will schedule promptly Discussed plans with patient for ongoing care management follow up- patient denies current care coordination/ care management needs and is agreeable to CCM RN CM case closure today; verbalizes understanding to contact PCP or other care providers for any needs that arise in the future, and confirms she has contact information for all care providers     Diabetes:  (Status: 05/24/22: Goal Met.) Long-Term goal Lab Results  Component Value Date   HGBA1C 6.5 01/25/2022  Provided education to patient about basic DM disease process; Reviewed prescribed diet with patient diabetic/ heart healthy; Counseled on importance of regular laboratory monitoring as prescribed;        Confirmed still not monitoring blood sugars at home-- she nor I heard back with follow from previously placed PCP care coordination  outreach on 02/14/22 regarding patient's desire to start monitoring blood sugars at home: encouraged her to discuss with PCP when she schedules/ completes next PCP office visit  Confirmed patient continues to stay "as active as possible" around ongoing chronic ankle/ knee pain: this was encouraged; has not yet resumed working out at [facility], however, she continues to verbalize plans to do do so, this was encouraged Confirmed patient continues to follow prescribed/ recommended diet  Hypertension: (Status: 05/24/22: Goal Met.) Long-Term goal Last practice recorded BP readings:  BP Readings from Last 3 Encounters:  02/08/22 (!) 164/108  01/25/22 132/80  08/09/21 128/80  Most recent eGFR/CrCl:  Lab Results  Component Value Date   EGFR 70 (  L) 09/09/2015    No components found for: CRCL  Evaluation of current treatment plan related to hypertension self management and patient's adherence to plan as established by provider;   Counseled on the importance of exercise goals with target of 150 minutes per week Discussed complications of poorly controlled blood pressure such as heart  disease, stroke, circulatory complications, vision complications, kidney impairment, sexual dysfunction;  Confirmed patient has continued taking prescribed BP medications-- continues to deny medication concerns; continues self-managing medications independently Confirmed patient continues to occasionally monitor/ record BP at home: continues to report "all look good;" she provides one specific value from recent home monitoring for our review, "131/72;" and tells me "this is basically what they all look like;" she was encouraged to continue periodic blood pressure monitoring at home and she verbalizes agreement with same     Plan: No further follow up required: patient denies current care coordination/ care management needs and is agreeable to CCM RN CM case closure today; CCM RN CM case closure accordingly     Oneta Rack, RN, BSN, [place] [phone]: direct office

## 2022-06-13 DIAGNOSIS — I10 Essential (primary) hypertension: Secondary | ICD-10-CM

## 2022-06-13 DIAGNOSIS — E119 Type 2 diabetes mellitus without complications: Secondary | ICD-10-CM | POA: Diagnosis not present

## 2022-07-20 ENCOUNTER — Ambulatory Visit (INDEPENDENT_AMBULATORY_CARE_PROVIDER_SITE_OTHER): Payer: Medicare PPO

## 2022-07-20 DIAGNOSIS — Z0000 Encounter for general adult medical examination without abnormal findings: Secondary | ICD-10-CM

## 2022-07-20 NOTE — Progress Notes (Signed)
Subjective:   Kelly Bell is a 81 y.o. female who presents for Medicare Annual (Subsequent) preventive examination.  Review of Systems    Virtual Visit via Telephone Note  I connected with  Kelly Bell on 07/20/22 at  3:30 PM EDT by telephone and verified that I am speaking with the correct person using two identifiers.  Location: Patient: home Provider: [place] Persons participating in the virtual visit: [place]   I discussed the limitations, risks, security and privacy concerns of performing an evaluation and management service by telephone and the availability of in person appointments. The patient expressed understanding and agreed to proceed.  Interactive audio and video telecommunications were attempted between this nurse and patient, however failed, due to patient having technical difficulties OR patient did not have access to video capability.  We continued and completed visit with audio only.  Some vital signs may be absent or patient reported.   Sheral Flow, LPN  Cardiac Risk Factors include: advanced age (>66mn, >>79women);dyslipidemia;hypertension;obesity (BMI >30kg/m2)     Objective:    Today's Vitals   07/20/22 1533  PainSc: 7    There is no height or weight on file to calculate BMI.     07/20/2022    3:36 PM 06/16/2021   11:30 AM 10/08/2018   11:20 AM 02/12/2018    1:34 PM 06/14/2016    1:27 PM 12/09/2015    1:54 PM 12/02/2015   10:28 AM  Advanced Directives  Does Patient Have a Medical Advance Directive? No No No No No No No  Does patient want to make changes to medical advance directive?   Yes (ED - Information included in AVS)      Would patient like information on creating a medical advance directive? No - Patient declined Yes (MAU/Ambulatory/Procedural Areas - Information given)    No - patient declined information     Current Medications (verified) Outpatient Encounter Medications as of 07/20/2022  Medication Sig    ACCU-CHEK AVIVA PLUS test strip USE TO CHECK BLOOD SUGAR ONCE DAILY. Dx: E11.9   aspirin 325 MG tablet Take 325 mg by mouth daily.   Blood Glucose Monitoring Suppl (ACCU-CHEK AVIVA PLUS) w/Device KIT USE AS DIRECTED TO CHECK GLUCOSE   Calcium Carbonate-Vitamin D 600-400 MG-UNIT tablet Take 1 tablet by mouth 2 (two) times daily.   ezetimibe (ZETIA) 10 MG tablet Take 1 tablet (10 mg total) by mouth every other day.   fluticasone (FLONASE) 50 MCG/ACT nasal spray USE TWO SPRAY IN EACH NOSTRIL ONCE DAILY   losartan-hydrochlorothiazide (HYZAAR) 50-12.5 MG tablet Take 0.5 tablets by mouth daily.   metoprolol succinate (TOPROL-XL) 25 MG 24 hr tablet Take 1 tablet by mouth once daily   nabumetone (RELAFEN) 500 MG tablet TAKE 1 TABLET BY MOUTH TWICE DAILY AS NEEDED AFTER A MEAL FOR ARTHRITIS   oxybutynin (DITROPAN) 5 MG tablet Take 1 tablet by mouth 4 times daily   No facility-administered encounter medications on file as of 07/20/2022.    Allergies (verified) Hydrocodone-acetaminophen, Metformin and related, Pravastatin, and Lisinopril   History: Past Medical History:  Diagnosis Date   Allergy    Arthritis    knees   Breast cancer ([place]    Right   HTN (hypertension)    Hyperlipidemia    Hypertension    Irritable bladder    Knee pain    Bilateral   SVD (spontaneous vaginal delivery)    x 2   Type II or unspecified type  diabetes mellitus without mention of complication, not stated as uncontrolled    Past Surgical History:  Procedure Laterality Date   ABDOMINAL HYSTERECTOMY  1975   COLONOSCOPY  05/10/05, 2011   hx polyps   MASTECTOMY PARTIAL / LUMPECTOMY W/ AXILLARY LYMPHADENECTOMY  07/1986   MASTECTOMY W/ SENTINEL NODE BIOPSY Left 10/28/2015   Procedure: LEFT MASTECTOMY WITH SENTINEL LYMPH NODE BIOPSY;  Surgeon: Stark Klein, MD;  Location: [facility];  Service: General;  Laterality: Left;   SHOULDER ARTHROSCOPY  2000   Family History  Problem Relation Age of Onset    Diabetes Mother 31       Deceased   Pancreatic cancer Mother        dx. early 73s   Diabetes Father 16       Deceased   Heart failure Father    Heart disease Father        CHF   Breast cancer Sister 62   Cancer Brother        oral cancer - metastasis to lungs; smoker   Corneal Dystrophy Son        bilateral   Healthy Daughter        x1   Breast cancer Sister        dx. 60s   Esophageal cancer Maternal Grandfather    Rectal cancer Maternal Grandfather    Stomach cancer Maternal Grandfather    Cancer Cousin        oral cancer dx. 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 Married 1965. 1 son - '68, 1 daughter-'72; 3 grandsons. 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               Social Determinants of Health   Financial Resource Strain: Low Risk  (07/20/2022)   Overall Financial Resource Strain (CARDIA)    Difficulty of Paying Living Expenses: Not hard at all  Food Insecurity: No Food Insecurity (07/20/2022)   Hunger Vital Sign    Worried About Running Out of Food in the Last Year: Never true    Ran Out of Food in the Last Year: Never true  Transportation Needs: No Transportation Needs (07/20/2022)   PRAPARE - Hydrologist (Medical): No    Lack of  Transportation (Non-Medical): No  Physical Activity: Sufficiently Active (07/20/2022)   Exercise Vital Sign    Days of Exercise per Week: 3 days    Minutes of Exercise per Session: 60 min  Stress: No Stress Concern Present (07/20/2022)   [place]    Feeling of Stress : Not at all  Social Connections: [place] (07/20/2022)   Social  Connection and Isolation Panel [NHANES]    Frequency of Communication with Friends and Family: More than three times a week    Frequency of Social Gatherings with Friends and Family: More than three times a week    Attends Religious Services: More than 4 times per year    Active Member of [employer] or Organizations: Yes    Attends Music therapist: More than 4 times per year    Marital Status: Married    Tobacco Counseling Counseling given: Not Answered   Clinical Intake:  Pre-visit preparation completed: Yes  Pain : 0-10 Pain Score: 7  Pain Type: Chronic pain Pain Location: Knee Pain Orientation: Right Pain Radiating Towards: ankle Pain Descriptors / Indicators: Aching Pain Onset: More than a month ago Pain Frequency: Constant Pain Relieving Factors: Tylenol  Pain Relieving Factors: Tylenol  BMI - recorded: 36.91 (02/08/2022) Nutritional Status: BMI > 30  Obese Nutritional Risks: None Diabetes: No  How often do you need to have someone help you when you read instructions, pamphlets, or other written materials from your doctor or pharmacy?: 1 - Never What is the last grade level you completed in school?: HSG  Diabetic? no  Interpreter Needed?: No  Information entered by :: Kelly Abu, LPN.   Activities of Daily Living    07/20/2022    3:39 PM  In your present state of health, do you have any difficulty performing the following activities:  Hearing? 0  Vision? 0  Difficulty concentrating or making decisions? 0  Walking or climbing stairs? 1   Comment uses a cane for unsteady gait  Dressing or bathing? 0  Doing errands, shopping? 0  Preparing Food and eating ? N  Using the Toilet? N  In the past six months, have you accidently leaked urine? Y  Do you have problems with loss of bowel control? Y  Managing your Medications? N  Managing your Finances? N  Housekeeping or managing your Housekeeping? N    Patient Care Team: Biagio Borg, MD as PCP - General (Internal Medicine) Marybelle Killings, MD (Orthopedic Surgery) Stark Klein, MD as Consulting Physician (General Surgery) Magrinat, Virgie Dad, MD (Inactive) as Consulting Physician (Oncology) Kyung Rudd, MD as Consulting Physician (Radiation Oncology) [place], RN as Registered Nurse Mauro Kaufmann, RN as Registered Nurse Jake Shark Johny Blamer, NP as Nurse Practitioner (Hematology and Oncology) [place] Comment, My [place], [place] as Referring Physician (Optometry)  Indicate any recent Medical Services you may have received from other than [facility] providers in the past year (date may be approximate).     Assessment:   This is a routine wellness examination for Kelly Bell.  Hearing/Vision screen Hearing Screening - Comments:: No hearing difficulty. No hearing aids. Vision Screening - Comments:: Patient wears eyeglasses.  Eye exam done: My Margret Chance, OD.  Dietary issues and exercise activities discussed: Current Exercise Habits: Structured exercise class, Type of exercise: walking;treadmill;stretching;strength training/weights;exercise ball;calisthenics, Time (Minutes): 60, Frequency (Times/Week): 3, Weekly Exercise (Minutes/Week): 180, Intensity: Moderate, Exercise limited by: orthopedic condition(s)   Goals Addressed             This Visit's Progress    To continue you be independent, physically & socially active.        Depression Screen    07/20/2022    3:58 PM 07/20/2022    3:38 PM 01/25/2022   11:14 AM 01/25/2022   10:56 AM 11/22/2021   10:45 AM 07/26/2021   10:44 AM 06/16/2021    11:30 AM  PHQ 2/9 Scores  PHQ - 2 Score 0 0 0 0 1 0 0    Fall Risk    07/20/2022    3:37 PM 05/24/2022    9:45 AM 02/14/2022    9:45 AM 01/25/2022   10:56 AM 11/22/2021   10:45 AM  Fall Risk   Falls in the past year? 0 0 0 0 0  Number falls in past yr: 0 0 0 0 0  Injury with Fall? 0 0 0 0 0  Comment  N/A- no falls reported N/A- no falls reported x 12 months  N/A- no falls reported  Risk for fall due to : Impaired balance/gait;Medication side effect Impaired balance/gait;Medication side effect Impaired balance/gait;Orthopedic patient  Orthopedic patient;Impaired mobility  Follow up Falls prevention discussed Falls prevention discussed Falls prevention discussed  Falls prevention discussed  Comment     reports occasionally uses cane    FALL RISK PREVENTION PERTAINING TO THE HOME:  Any stairs in or around the home? Yes  If so, are there any without handrails? No  Home free of loose throw rugs in walkways, pet beds, electrical cords, etc? Yes  Adequate lighting in your home to reduce risk of falls? Yes   ASSISTIVE DEVICES UTILIZED TO PREVENT FALLS:  Life alert? No  Use of a cane, walker or w/c? Yes  Grab bars in the bathroom? Yes  Shower chair or bench in shower? No  Elevated toilet seat or a handicapped toilet? Yes   TIMED UP AND GO:  Was the test performed? No .  Length of time to ambulate 10 feet: n/a sec.   Appearance of gait: Gait not evaluated during this visit.  Cognitive Function:    10/08/2018   12:13 PM  MMSE - Mini Mental State Exam  Orientation to time 5  Orientation to Place 5  Registration 3  Attention/ Calculation 5  Recall 1  Language- name 2 objects 2  Language- repeat 1  Language- follow 3 step command 3  Language- read & follow direction 1  Write a sentence 1  Copy design 1  Total score 28        07/20/2022    3:38 PM  6CIT Screen  What Year? 0 points  What month? 0 points  What time? 0 points  Count back from 20 0 points  Months in reverse  0 points  Repeat phrase 0 points  Total Score 0 points    Immunizations Immunization History  Administered Date(s) Administered   Fluad Quad(high Dose 65+) 08/12/2019, 12/30/2020, 07/26/2021   Influenza Split 08/03/2011, 09/13/2012   Influenza Whole 08/12/2008, 08/12/2009, 08/19/2010   Influenza, High Dose Seasonal PF 09/05/2013, 09/07/2016, 09/06/2017, 09/28/2018   Influenza,inj,Quad PF,6+ Mos 08/18/2014, 08/07/2015   PFIZER(Purple Top)SARS-COV-2 Vaccination 11/15/2019, 12/12/2019, 08/26/2020   Pneumococcal Conjugate-13 08/18/2014   Pneumococcal Polysaccharide-23 09/10/2007, 07/07/2020   Td 10/21/2008   Tdap 05/09/2019   Zoster, Live 02/04/2010    TDAP status: Up to date  Flu Vaccine status: Due, Education has been provided regarding the importance of this vaccine. Advised may receive this vaccine at local pharmacy or Health Dept. Aware to provide a copy of the vaccination record if obtained from local pharmacy or Health Dept. Verbalized acceptance and understanding.  Pneumococcal vaccine status: Up to date  Covid-19 vaccine status: Completed vaccines  Qualifies for Shingles Vaccine? Yes   Zostavax completed Yes   Shingrix Completed?: No.    Education has been provided regarding the importance of this vaccine. Patient has been advised  to call insurance company to determine out of pocket expense if they have not yet received this vaccine. Advised may also receive vaccine at local pharmacy or Health Dept. Verbalized acceptance and understanding.  Screening Tests Health Maintenance  Topic Date Due   Zoster Vaccines- Shingrix (1 of 2) Never done   COVID-19 Vaccine (4 - Pfizer risk series) 10/21/2020   OPHTHALMOLOGY EXAM  08/10/2021   INFLUENZA VACCINE  06/14/2022   FOOT EXAM  07/26/2022   HEMOGLOBIN A1C  07/28/2022   Diabetic kidney evaluation - GFR measurement  01/26/2023   Diabetic kidney evaluation - Urine ACR  01/26/2023   TETANUS/TDAP  05/08/2029   Pneumonia Vaccine 79+  Years old  Completed   DEXA SCAN  Completed   HPV VACCINES  Aged Out    Health Maintenance  Health Maintenance Due  Topic Date Due   Zoster Vaccines- Shingrix (1 of 2) Never done   COVID-19 Vaccine (4 - Pfizer risk series) 10/21/2020   OPHTHALMOLOGY EXAM  08/10/2021   INFLUENZA VACCINE  06/14/2022    Colorectal cancer screening: No longer required.   Mammogram status: No longer required due to history of breast cancer/masectomy.  Bone Density status: Completed 10/16/2014. Results reflect: Bone density results: OSTEOPENIA. Repeat every 0 years.  Lung Cancer Screening: (Low Dose CT Chest recommended if Age 26-80 years, 30 pack-year currently smoking OR have quit w/in 15years.) does not qualify.   Lung Cancer Screening Referral: no  Additional Screening:  Hepatitis C Screening: does not qualify; Completed no  Vision Screening: Recommended annual ophthalmology exams for early detection of glaucoma and other disorders of the eye. Is the patient up to date with their annual eye exam?  Yes  Who is the provider or what is the name of the office in which the patient attends annual eye exams? My Dorien Chihuahua, OD. at [facility]   If pt is not established with a provider, would they like to be referred to a provider to establish care? No .   Dental Screening: Recommended annual dental exams for proper oral hygiene  Community Resource Referral / Chronic Care Management: CRR required this visit?  No   CCM required this visit?  No      Plan:     I have personally reviewed and noted the following in the patient's chart:   Medical and social history Use of alcohol, tobacco or illicit drugs  Current medications and supplements including opioid prescriptions. Patient is not currently taking opioid prescriptions. Functional ability and status Nutritional status Physical activity Advanced directives List of other physicians Hospitalizations, surgeries, and ER visits in  previous 12 months Vitals Screenings to include cognitive, depression, and falls Referrals and appointments  In addition, I have reviewed and discussed with patient certain preventive protocols, quality metrics, and best practice recommendations. A written personalized care plan for preventive services as well as general preventive health recommendations were provided to patient.     Sheral Flow, LPN   12/18/5571   Nurse Notes:  Patient is cogitatively intact. There were no vitals filed for this visit. Used BMI from last visit.

## 2022-07-20 NOTE — Patient Instructions (Addendum)
Ms. Kelly Bell , Thank you for taking time to come for your Medicare Wellness Visit. I appreciate your ongoing commitment to your health goals. Please review the following plan we discussed and let me know if I can assist you in the future.   Screening recommendations/referrals: Colonoscopy: Last done 06/28/2016; no longer recommended. Mammogram: Last done 09/01/2015; will speak with Dr. Jenny Reichmann regarding scheduling. Bone Density: Last done 10/16/2014; will speak with Dr. Jenny Reichmann regarding scheduling. Recommended yearly ophthalmology/optometry visit for glaucoma screening and checkup Recommended yearly dental visit for hygiene and checkup  Vaccinations: Influenza vaccine: 07/26/2021; due every Fall Season Pneumococcal vaccine: 11/18/2013, 07/07/2020 Tdap vaccine: 05/09/2019; due every 10 years Shingles vaccine: never done   Covid-19:11/15/2019, 12/12/2019, 08/26/2020  Advanced directives: No  Conditions/risks identified: Yes  Next appointment: 07/24/2023 at 3:30 p.m. telephone visit with Mignon Pine, Nurse Health Advisor.  If you need to reschedule or cancel, please call [phone].  Preventive Care 39 Years and Older, Female Preventive care refers to lifestyle choices and visits with your health care provider that can promote health and wellness. What does preventive care include? A yearly physical exam. This is also called an annual well check. Dental exams once or twice a year. Routine eye exams. Ask your health care provider how often you should have your eyes checked. Personal lifestyle choices, including: Daily care of your teeth and gums. Regular physical activity. Eating a healthy diet. Avoiding tobacco and drug use. Limiting alcohol use. Practicing safe sex. Taking low-dose aspirin every day. Taking vitamin and mineral supplements as recommended by your health care provider. What happens during an annual well check? The services and screenings done by your health care provider during your  annual well check will depend on your age, overall health, lifestyle risk factors, and family history of disease. Counseling  Your health care provider may ask you questions about your: Alcohol use. Tobacco use. Drug use. Emotional well-being. Home and relationship well-being. Sexual activity. Eating habits. History of falls. Memory and ability to understand (cognition). Work and work Statistician. Reproductive health. Screening  You may have the following tests or measurements: Height, weight, and BMI. Blood pressure. Lipid and cholesterol levels. These may be checked every 5 years, or more frequently if you are over 32 years old. Skin check. Lung cancer screening. You may have this screening every year starting at age 64 if you have a 30-pack-year history of smoking and currently smoke or have quit within the past 15 years. Fecal occult blood test (FOBT) of the stool. You may have this test every year starting at age 24. Flexible sigmoidoscopy or colonoscopy. You may have a sigmoidoscopy every 5 years or a colonoscopy every 10 years starting at age 81. Hepatitis C blood test. Hepatitis B blood test. Sexually transmitted disease (STD) testing. Diabetes screening. This is done by checking your blood sugar (glucose) after you have not eaten for a while (fasting). You may have this done every 1-3 years. Bone density scan. This is done to screen for osteoporosis. You may have this done starting at age 81. Mammogram. This may be done every 1-2 years. Talk to your health care provider about how often you should have regular mammograms. Talk with your health care provider about your test results, treatment options, and if necessary, the need for more tests. Vaccines  Your health care provider may recommend certain vaccines, such as: Influenza vaccine. This is recommended every year. Tetanus, diphtheria, and acellular pertussis (Tdap, Td) vaccine. You may need a Td booster every 10  years. Zoster vaccine. You may need this after age 64. Pneumococcal 13-valent conjugate (PCV13) vaccine. One dose is recommended after age 47. Pneumococcal polysaccharide (PPSV23) vaccine. One dose is recommended after age 22. Talk to your health care provider about which screenings and vaccines you need and how often you need them. This information is not intended to replace advice given to you by your health care provider. Make sure you discuss any questions you have with your health care provider. Document Released: 11/27/2015 Document Revised: 07/20/2016 Document Reviewed: 09/01/2015 Elsevier Interactive Patient Education  2017 [employer] Prevention in the Home Falls can cause injuries. They can happen to people of all ages. There are many things you can do to make your home safe and to help prevent falls. What can I do on the outside of my home? Regularly fix the edges of walkways and driveways and fix any cracks. Remove anything that might make you trip as you walk through a door, such as a raised step or threshold. Trim any bushes or trees on the path to your home. Use bright outdoor lighting. Clear any walking paths of anything that might make someone trip, such as rocks or tools. Regularly check to see if handrails are loose or broken. Make sure that both sides of any steps have handrails. Any raised decks and porches should have guardrails on the edges. Have any leaves, snow, or ice cleared regularly. Use sand or salt on walking paths during winter. Clean up any spills in your garage right away. This includes oil or grease spills. What can I do in the bathroom? Use night lights. Install grab bars by the toilet and in the tub and shower. Do not use towel bars as grab bars. Use non-skid mats or decals in the tub or shower. If you need to sit down in the shower, use a plastic, non-slip stool. Keep the floor dry. Clean up any water that spills on the floor as soon as it  happens. Remove soap buildup in the tub or shower regularly. Attach bath mats securely with double-sided non-slip rug tape. Do not have throw rugs and other things on the floor that can make you trip. What can I do in the bedroom? Use night lights. Make sure that you have a light by your bed that is easy to reach. Do not use any sheets or blankets that are too big for your bed. They should not hang down onto the floor. Have a firm chair that has side arms. You can use this for support while you get dressed. Do not have throw rugs and other things on the floor that can make you trip. What can I do in the kitchen? Clean up any spills right away. Avoid walking on wet floors. Keep items that you use a lot in easy-to-reach places. If you need to reach something above you, use a strong step stool that has a grab bar. Keep electrical cords out of the way. Do not use floor polish or wax that makes floors slippery. If you must use wax, use non-skid floor wax. Do not have throw rugs and other things on the floor that can make you trip. What can I do with my stairs? Do not leave any items on the stairs. Make sure that there are handrails on both sides of the stairs and use them. Fix handrails that are broken or loose. Make sure that handrails are as long as the stairways. Check any carpeting to make sure that  it is firmly attached to the stairs. Fix any carpet that is loose or worn. Avoid having throw rugs at the top or bottom of the stairs. If you do have throw rugs, attach them to the floor with carpet tape. Make sure that you have a light switch at the top of the stairs and the bottom of the stairs. If you do not have them, ask someone to add them for you. What else can I do to help prevent falls? Wear shoes that: Do not have high heels. Have rubber bottoms. Are comfortable and fit you well. Are closed at the toe. Do not wear sandals. If you use a stepladder: Make sure that it is fully opened.  Do not climb a closed stepladder. Make sure that both sides of the stepladder are locked into place. Ask someone to hold it for you, if possible. Clearly mark and make sure that you can see: Any grab bars or handrails. First and last steps. Where the edge of each step is. Use tools that help you move around (mobility aids) if they are needed. These include: Canes. Walkers. Scooters. Crutches. Turn on the lights when you go into a dark area. Replace any light bulbs as soon as they burn out. Set up your furniture so you have a clear path. Avoid moving your furniture around. If any of your floors are uneven, fix them. If there are any pets around you, be aware of where they are. Review your medicines with your doctor. Some medicines can make you feel dizzy. This can increase your chance of falling. Ask your doctor what other things that you can do to help prevent falls. This information is not intended to replace advice given to you by your health care provider. Make sure you discuss any questions you have with your health care provider. Document Released: 08/27/2009 Document Revised: 04/07/2016 Document Reviewed: 12/05/2014 Elsevier Interactive Patient Education  2017 [employer].

## 2022-07-26 ENCOUNTER — Other Ambulatory Visit: Payer: Self-pay | Admitting: Internal Medicine

## 2022-07-26 NOTE — Telephone Encounter (Signed)
Please refill as per office routine med refill policy (all routine meds to be refilled for 3 mo or monthly (per pt preference) up to one year from last visit, then month to month grace period for 3 mo, then further med refills will have to be denied) ? ?

## 2022-07-27 ENCOUNTER — Ambulatory Visit: Payer: Medicare PPO | Admitting: Internal Medicine

## 2022-07-27 VITALS — BP 138/84 | HR 87 | Temp 97.4°F | Ht 65.0 in | Wt 216.0 lb

## 2022-07-27 DIAGNOSIS — I10 Essential (primary) hypertension: Secondary | ICD-10-CM

## 2022-07-27 DIAGNOSIS — Z170 Estrogen receptor positive status [ER+]: Secondary | ICD-10-CM

## 2022-07-27 DIAGNOSIS — E559 Vitamin D deficiency, unspecified: Secondary | ICD-10-CM

## 2022-07-27 DIAGNOSIS — E119 Type 2 diabetes mellitus without complications: Secondary | ICD-10-CM | POA: Diagnosis not present

## 2022-07-27 DIAGNOSIS — E538 Deficiency of other specified B group vitamins: Secondary | ICD-10-CM

## 2022-07-27 DIAGNOSIS — C50212 Malignant neoplasm of upper-inner quadrant of left female breast: Secondary | ICD-10-CM | POA: Diagnosis not present

## 2022-07-27 DIAGNOSIS — Z23 Encounter for immunization: Secondary | ICD-10-CM | POA: Diagnosis not present

## 2022-07-27 DIAGNOSIS — E2839 Other primary ovarian failure: Secondary | ICD-10-CM | POA: Diagnosis not present

## 2022-07-27 DIAGNOSIS — J309 Allergic rhinitis, unspecified: Secondary | ICD-10-CM | POA: Diagnosis not present

## 2022-07-27 MED ORDER — TRIAMCINOLONE ACETONIDE 55 MCG/ACT NA AERO: 2.0000 | INHALATION_SPRAY | Freq: Every day | NASAL | 12 refills | Status: DC

## 2022-07-27 NOTE — Progress Notes (Signed)
Patient ID: Kelly Bell, female   DOB: 08-Oct-1941, 81 y.o.   MRN: [id number]        Chief Complaint: follow up allergies and ear fulllness       HPI:  Kelly Bell is a 81 y.o. female here overall doing ok, Does have several wks ongoing nasal allergy symptoms with clearish congestion, itch and sneezing, without fever, pain, ST, cough, swelling or wheezing but has bilateral ear aching and fullness.  Has chronic right ankle pain, plans to f/u Dr Lorin Mercy.   Pt denies polydipsia, polyuria, or new focal neuro s/s.   Pt denies fever, wt loss, night sweats, loss of appetite, or other constitutional symptoms   Due for flu shot.  Due for mammogram, dxa Wt Readings from Last 3 Encounters:  07/27/22 216 lb (98 kg)  02/08/22 221 lb 12.8 oz (100.6 kg)  01/25/22 220 lb (99.8 kg)   BP Readings from Last 3 Encounters:  07/27/22 138/84  02/08/22 (!) 164/108  01/25/22 132/80         Past Medical History:  Diagnosis Date   Allergy    Arthritis    knees   Breast cancer ([place])    Right   HTN (hypertension)    Hyperlipidemia    Hypertension    Irritable bladder    Knee pain    Bilateral   SVD (spontaneous vaginal delivery)    x 2   Type II or unspecified type diabetes mellitus without mention of complication, not stated as uncontrolled    Past Surgical History:  Procedure Laterality Date   ABDOMINAL HYSTERECTOMY  1975   COLONOSCOPY  05/10/05, 2011   hx polyps   MASTECTOMY PARTIAL / LUMPECTOMY W/ AXILLARY LYMPHADENECTOMY  07/1986   MASTECTOMY W/ SENTINEL NODE BIOPSY Left 10/28/2015   Procedure: LEFT MASTECTOMY WITH SENTINEL LYMPH NODE BIOPSY;  Surgeon: Stark Klein, MD;  Location: [facility];  Service: General;  Laterality: Left;   SHOULDER ARTHROSCOPY  2000    reports that she has never smoked. She has never used smokeless tobacco. She reports current alcohol use. She reports that she does not use drugs. family history includes Breast cancer in her sister; Breast cancer (age of  onset: 11) in her sister; Cancer in her brother and cousin; Corneal Dystrophy in her son; Diabetes (age of onset: 30) in her father and mother; Esophageal cancer in her maternal grandfather; Healthy in her daughter; Heart disease in her father; Heart failure in her father; Pancreatic cancer in her mother; Rectal cancer in her maternal grandfather; Stomach cancer in her maternal grandfather. Allergies  Allergen Reactions   Hydrocodone-Acetaminophen Other (See Comments)    Mental confusion   Metformin And Related Diarrhea   Pravastatin Other (See Comments)    Severe muscle cramps.   Lisinopril     cough   Current Outpatient Medications on File Prior to Visit  Medication Sig Dispense Refill   ACCU-CHEK AVIVA PLUS test strip USE TO CHECK BLOOD SUGAR ONCE DAILY. Dx: E11.9 100 each 0   aspirin 325 MG tablet Take 325 mg by mouth daily.     Blood Glucose Monitoring Suppl (ACCU-CHEK AVIVA PLUS) w/Device KIT USE AS DIRECTED TO CHECK GLUCOSE 1 kit 0   Calcium Carbonate-Vitamin D 600-400 MG-UNIT tablet Take 1 tablet by mouth 2 (two) times daily.     ezetimibe (ZETIA) 10 MG tablet Take 1 tablet (10 mg total) by mouth every other day. 45 tablet 3   losartan-hydrochlorothiazide (HYZAAR) 50-12.5 MG tablet  Take 1 tablet by mouth once daily 90 tablet 0   metoprolol succinate (TOPROL-XL) 25 MG 24 hr tablet Take 1 tablet by mouth once daily 90 tablet 3   nabumetone (RELAFEN) 500 MG tablet TAKE 1 TABLET BY MOUTH TWICE DAILY AS NEEDED AFTER A MEAL FOR ARTHRITIS 60 tablet 2   oxybutynin (DITROPAN) 5 MG tablet Take 1 tablet by mouth 4 times daily 360 tablet 3   No current facility-administered medications on file prior to visit.        ROS:  All others reviewed and negative.  Objective        PE:  BP 138/84 (BP Location: Left Arm, Patient Position: Sitting, Cuff Size: Large)   Pulse 87   Temp (!) 97.4 F (36.3 C) (Oral)   Ht _0  (1.651 m)   Wt 216 lb (98 kg)   SpO2 97%   BMI 35.94 kg/m                  Constitutional: Pt appears in NAD               HENT: Head: NCAT.                Right Ear: External ear normal.                 Left Ear: External ear normal.   Bilat tm's with mild erythema.  Max sinus areas non tender.  Pharynx with mild erythema, no exudate               Eyes: . Pupils are equal, round, and reactive to light. Conjunctivae and EOM are normal               Nose: without d/c or deformity               Neck: Neck supple. Gross normal ROM               Cardiovascular: Normal rate and regular rhythm.                 Pulmonary/Chest: Effort normal and breath sounds without rales or wheezing.                Abd:  Soft, NT, ND, + BS, no organomegaly               Neurological: Pt is alert. At baseline orientation, motor grossly intact               Skin: Skin is warm. No rashes, no other new lesions, LE edema - trace bilateral               Psychiatric: Pt behavior is normal without agitation   Micro: none  Cardiac tracings I have personally interpreted today:  none  Pertinent Radiological findings (summarize): none   Lab Results  Component Value Date   WBC 4.1 01/25/2022   HGB 14.3 01/25/2022   HCT 42.6 01/25/2022   PLT 196.0 01/25/2022   GLUCOSE 129 (H) 01/25/2022   CHOL 234 (H) 01/25/2022   TRIG 178.0 (H) 01/25/2022   HDL 43.50 01/25/2022   LDLDIRECT 111.5 01/01/2007   LDLCALC 155 (H) 01/25/2022   ALT 13 01/25/2022   AST 16 01/25/2022   NA 139 01/25/2022   K 4.0 01/25/2022   CL 103 01/25/2022   CREATININE 0.79 01/25/2022   BUN 12 01/25/2022   CO2 29 01/25/2022   TSH 1.37 01/25/2022   HGBA1C 6.5 01/25/2022  MICROALBUR 0.7 01/25/2022   Assessment/Plan:  Kelly Bell is a 81 y.o. Black or African American [2] female with  has a past medical history of Allergy, Arthritis, Breast cancer ([place]), HTN (hypertension), Hyperlipidemia, Hypertension, Irritable bladder, Knee pain, SVD (spontaneous vaginal delivery), and Type II or unspecified type diabetes mellitus  without mention of complication, not stated as uncontrolled.  Malignant neoplasm of upper-inner quadrant of left breast in female, estrogen receptor positive ([place]) Now for f/u mammogram as is due  Allergic rhinitis Mild to mod seasonal flare, for add nasaocort asd, to f/u any worsening symptoms or concerns  Essential hypertension BP Readings from Last 3 Encounters:  07/27/22 138/84  02/08/22 (!) 164/108  01/25/22 132/80   Stable, pt to continue medical treatment hyzaar 50 12.5 qd, toprol xl 25 qd   Vitamin D deficiency Last vitamin D Lab Results  Component Value Date   VD25OH 46.25 01/25/2022   Stable, cont oral replacement, but also for DXA as is due  Followup: Return in about 6 months (around 01/25/2023).  Cathlean Cower, MD 07/30/2022 9:09 PM [facility]

## 2022-07-27 NOTE — Patient Instructions (Addendum)
Please schedule the bone density test before leaving today at the scheduling desk (where you check out)  You will be contacted regarding the referral for: mammogram  You had the flu shot today  Please take all new medication as prescribed - the OTC Nasacort  Please continue all other medications as before, and refills have been done if requested.  Please have the pharmacy call with any other refills you may need.  Please continue your efforts at being more active, low cholesterol diet, and weight control  Please keep your appointments with your specialists as you may have planned  Please make an Appointment to return in 6 months, or sooner if needed, also with Lab Appointment for testing done 3-5 days before at the [facility] (so this is for TWO appointments - please see the scheduling desk as you leave)

## 2022-07-30 ENCOUNTER — Encounter: Payer: Self-pay | Admitting: Internal Medicine

## 2022-07-30 NOTE — Assessment & Plan Note (Signed)
Last vitamin D Lab Results  Component Value Date   VD25OH 46.25 01/25/2022   Stable, cont oral replacement, but also for DXA as is due

## 2022-07-30 NOTE — Assessment & Plan Note (Signed)
Mild to mod seasonal flare, for add nasaocort asd, to f/u any worsening symptoms or concerns

## 2022-07-30 NOTE — Assessment & Plan Note (Signed)
BP Readings from Last 3 Encounters:  07/27/22 138/84  02/08/22 (!) 164/108  01/25/22 132/80   Stable, pt to continue medical treatment hyzaar 50 12.5 qd, toprol xl 25 qd

## 2022-07-30 NOTE — Addendum Note (Signed)
Addended by: Biagio Borg on: 07/30/2022 09:11 PM   Modules accepted: Orders

## 2022-07-30 NOTE — Assessment & Plan Note (Signed)
Now for f/u mammogram as is due

## 2022-08-22 ENCOUNTER — Other Ambulatory Visit (HOSPITAL_COMMUNITY): Payer: Medicare PPO

## 2022-08-22 ENCOUNTER — Ambulatory Visit (INDEPENDENT_AMBULATORY_CARE_PROVIDER_SITE_OTHER)
Admission: RE | Admit: 2022-08-22 | Discharge: 2022-08-22 | Disposition: A | Discharge status: Home / Self Care | Payer: Medicare PPO | Source: Ambulatory Visit | Attending: Internal Medicine | Admitting: Internal Medicine

## 2022-08-22 DIAGNOSIS — E2839 Other primary ovarian failure: Secondary | ICD-10-CM | POA: Diagnosis not present

## 2022-09-27 ENCOUNTER — Other Ambulatory Visit: Payer: Self-pay | Admitting: Internal Medicine

## 2022-09-27 ENCOUNTER — Ambulatory Visit
Admission: RE | Admit: 2022-09-27 | Discharge: 2022-09-27 | Disposition: A | Discharge status: Home / Self Care | Payer: Medicare PPO | Source: Ambulatory Visit | Attending: Internal Medicine | Admitting: Internal Medicine

## 2022-09-27 DIAGNOSIS — C50212 Malignant neoplasm of upper-inner quadrant of left female breast: Secondary | ICD-10-CM

## 2022-09-27 DIAGNOSIS — E559 Vitamin D deficiency, unspecified: Secondary | ICD-10-CM

## 2022-09-27 DIAGNOSIS — E119 Type 2 diabetes mellitus without complications: Secondary | ICD-10-CM

## 2022-09-27 DIAGNOSIS — E538 Deficiency of other specified B group vitamins: Secondary | ICD-10-CM

## 2022-09-27 DIAGNOSIS — Z23 Encounter for immunization: Secondary | ICD-10-CM

## 2022-09-27 DIAGNOSIS — J309 Allergic rhinitis, unspecified: Secondary | ICD-10-CM

## 2022-09-27 DIAGNOSIS — I10 Essential (primary) hypertension: Secondary | ICD-10-CM

## 2022-09-27 DIAGNOSIS — E2839 Other primary ovarian failure: Secondary | ICD-10-CM

## 2022-09-27 DIAGNOSIS — Z1231 Encounter for screening mammogram for malignant neoplasm of breast: Secondary | ICD-10-CM | POA: Diagnosis not present

## 2022-09-27 HISTORY — DX: Personal history of irradiation: Z92.3

## 2022-09-30 ENCOUNTER — Other Ambulatory Visit: Payer: Self-pay | Admitting: Internal Medicine

## 2022-09-30 DIAGNOSIS — R928 Other abnormal and inconclusive findings on diagnostic imaging of breast: Secondary | ICD-10-CM

## 2022-10-12 ENCOUNTER — Ambulatory Visit
Admission: RE | Admit: 2022-10-12 | Discharge: 2022-10-12 | Disposition: A | Discharge status: Home / Self Care | Payer: Medicare PPO | Source: Ambulatory Visit | Attending: Internal Medicine | Admitting: Internal Medicine

## 2022-10-12 ENCOUNTER — Other Ambulatory Visit: Payer: Self-pay | Admitting: Internal Medicine

## 2022-10-12 DIAGNOSIS — R928 Other abnormal and inconclusive findings on diagnostic imaging of breast: Secondary | ICD-10-CM

## 2022-10-12 DIAGNOSIS — R921 Mammographic calcification found on diagnostic imaging of breast: Secondary | ICD-10-CM | POA: Diagnosis not present

## 2022-10-21 ENCOUNTER — Encounter: Payer: Self-pay | Admitting: Internal Medicine

## 2022-10-24 MED ORDER — FLUTICASONE PROPIONATE 50 MCG/ACT NA SUSP: 2.0000 | Freq: Every day | NASAL | 2 refills | Status: DC

## 2022-10-24 NOTE — Telephone Encounter (Signed)
Patient would like to restart Fluticasone Propionate as the nasacort or the spray are providing any relief.Please advise

## 2022-10-24 NOTE — Telephone Encounter (Signed)
Ok for change back to flonase, but would need ROV if not better with flonase

## 2022-10-25 ENCOUNTER — Other Ambulatory Visit: Payer: Self-pay | Admitting: Internal Medicine

## 2022-10-25 NOTE — Telephone Encounter (Signed)
Please refill as per office routine med refill policy (all routine meds to be refilled for 3 mo or monthly (per pt preference) up to one year from last visit, then month to month grace period for 3 mo, then further med refills will have to be denied) ? ?

## 2023-01-18 ENCOUNTER — Other Ambulatory Visit: Payer: Self-pay | Admitting: Internal Medicine

## 2023-01-18 NOTE — Telephone Encounter (Signed)
Please refill as per office routine med refill policy (all routine meds to be refilled for 3 mo or monthly (per pt preference) up to one year from last visit, then month to month grace period for 3 mo, then further med refills will have to be denied) ? ?

## 2023-01-24 ENCOUNTER — Other Ambulatory Visit (INDEPENDENT_AMBULATORY_CARE_PROVIDER_SITE_OTHER): Payer: Medicare PPO

## 2023-01-24 DIAGNOSIS — E538 Deficiency of other specified B group vitamins: Secondary | ICD-10-CM | POA: Diagnosis not present

## 2023-01-24 DIAGNOSIS — E119 Type 2 diabetes mellitus without complications: Secondary | ICD-10-CM | POA: Diagnosis not present

## 2023-01-24 DIAGNOSIS — E559 Vitamin D deficiency, unspecified: Secondary | ICD-10-CM

## 2023-01-24 LAB — LIPID PANEL
Cholesterol: 212 mg/dL — ABNORMAL HIGH (ref 0–200)
HDL: 46.6 mg/dL (ref >39.00)
LDL Cholesterol: 138 mg/dL — ABNORMAL HIGH (ref 0–99)
NonHDL: 165.87
Total CHOL/HDL Ratio: 5
Triglycerides: 139 mg/dL (ref 0.0–149.0)
VLDL: 27.8 mg/dL (ref 0.0–40.0)

## 2023-01-24 LAB — CBC WITH DIFFERENTIAL/PLATELET
Basophils Absolute: 0 10*3/uL (ref 0.0–0.1)
Basophils Relative: 0.6 % (ref 0.0–3.0)
Eosinophils Absolute: 0.1 10*3/uL (ref 0.0–0.7)
Eosinophils Relative: 2.9 % (ref 0.0–5.0)
HCT: 42.5 % (ref 36.0–46.0)
Hemoglobin: 14.3 g/dL (ref 12.0–15.0)
Lymphocytes Relative: 46.8 % — ABNORMAL HIGH (ref 12.0–46.0)
Lymphs Abs: 2.1 10*3/uL (ref 0.7–4.0)
MCHC: 33.5 g/dL (ref 30.0–36.0)
MCV: 94.8 fl (ref 78.0–100.0)
Monocytes Absolute: 0.2 10*3/uL (ref 0.1–1.0)
Monocytes Relative: 5.3 % (ref 3.0–12.0)
Neutro Abs: 2 10*3/uL (ref 1.4–7.7)
Neutrophils Relative %: 44.4 % (ref 43.0–77.0)
Platelets: 207 10*3/uL (ref 150.0–400.0)
RBC: 4.49 Mil/uL (ref 3.87–5.11)
RDW: 13 % (ref 11.5–15.5)
WBC: 4.4 10*3/uL (ref 4.0–10.5)

## 2023-01-24 LAB — URINALYSIS, ROUTINE W REFLEX MICROSCOPIC
Bilirubin Urine: NEGATIVE
Hgb urine dipstick: NEGATIVE
Ketones, ur: NEGATIVE
Leukocytes,Ua: NEGATIVE
Nitrite: NEGATIVE
RBC / HPF: NONE SEEN (ref 0–?)
Specific Gravity, Urine: 1.02 (ref 1.000–1.030)
Total Protein, Urine: NEGATIVE
Urine Glucose: NEGATIVE
Urobilinogen, UA: 0.2 (ref 0.0–1.0)
pH: 6 (ref 5.0–8.0)

## 2023-01-24 LAB — HEMOGLOBIN A1C: Hgb A1c MFr Bld: 6.3 % (ref 4.6–6.5)

## 2023-01-24 LAB — HEPATIC FUNCTION PANEL
ALT: 13 U/L (ref 0–35)
AST: 16 U/L (ref 0–37)
Albumin: 4.1 g/dL (ref 3.5–5.2)
Alkaline Phosphatase: 52 U/L (ref 39–117)
Bilirubin, Direct: 0.1 mg/dL (ref 0.0–0.3)
Total Bilirubin: 0.7 mg/dL (ref 0.2–1.2)
Total Protein: 6.8 g/dL (ref 6.0–8.3)

## 2023-01-24 LAB — VITAMIN B12: Vitamin B-12: >1500 pg/mL — ABNORMAL HIGH (ref 211–911)

## 2023-01-24 LAB — VITAMIN D 25 HYDROXY (VIT D DEFICIENCY, FRACTURES): VITD: 43.9 ng/mL (ref 30.00–100.00)

## 2023-01-24 LAB — BASIC METABOLIC PANEL
BUN: 14 mg/dL (ref 6–23)
CO2: 28 mEq/L (ref 19–32)
Calcium: 10.2 mg/dL (ref 8.4–10.5)
Chloride: 105 mEq/L (ref 96–112)
Creatinine, Ser: 0.79 mg/dL (ref 0.40–1.20)
GFR: 69.37 mL/min (ref >60.00)
Glucose, Bld: 113 mg/dL — ABNORMAL HIGH (ref 70–99)
Potassium: 4.1 mEq/L (ref 3.5–5.1)
Sodium: 140 mEq/L (ref 135–145)

## 2023-01-24 LAB — MICROALBUMIN / CREATININE URINE RATIO
Creatinine,U: 103 mg/dL
Microalb Creat Ratio: 1 mg/g (ref 0.0–30.0)
Microalb, Ur: 1 mg/dL (ref 0.0–1.9)

## 2023-01-24 LAB — TSH: TSH: 1.67 u[IU]/mL (ref 0.35–5.50)

## 2023-01-26 ENCOUNTER — Ambulatory Visit: Payer: Medicare PPO | Admitting: Internal Medicine

## 2023-01-26 VITALS — BP 132/82 | HR 63 | Temp 97.7°F | Ht 65.0 in | Wt 216.0 lb

## 2023-01-26 DIAGNOSIS — E538 Deficiency of other specified B group vitamins: Secondary | ICD-10-CM | POA: Diagnosis not present

## 2023-01-26 DIAGNOSIS — E7800 Pure hypercholesterolemia, unspecified: Secondary | ICD-10-CM

## 2023-01-26 DIAGNOSIS — E1165 Type 2 diabetes mellitus with hyperglycemia: Secondary | ICD-10-CM | POA: Diagnosis not present

## 2023-01-26 DIAGNOSIS — Z0001 Encounter for general adult medical examination with abnormal findings: Secondary | ICD-10-CM | POA: Diagnosis not present

## 2023-01-26 DIAGNOSIS — E559 Vitamin D deficiency, unspecified: Secondary | ICD-10-CM

## 2023-01-26 DIAGNOSIS — I10 Essential (primary) hypertension: Secondary | ICD-10-CM

## 2023-01-26 DIAGNOSIS — M7661 Achilles tendinitis, right leg: Secondary | ICD-10-CM

## 2023-01-26 NOTE — Progress Notes (Signed)
Patient ID: Kelly Bell, female   DOB: 01-03-1941, 82 y.o.   MRN: [id number]         Chief Complaint:: wellness exam and Medical Management of Chronic Issues (Handicap placard)  Due to worsening debility and gait, hld, right achilles tendonitis, low B12, DM, htn, hld       HPI:  Kelly Bell is a 82 y.o. female here for wellness exam; declines covid booster but for shingrix at pharmacy, o/w up to date                        Also Pt denies chest pain, increased sob or doe, wheezing, orthopnea, PND, increased LE swelling, palpitations, dizziness or syncope.   Pt denies polydipsia, polyuria, or new focal neuro s/s.    Pt denies fever, wt loss, night sweats, loss of appetite, or other constitutional symptoms  Does also have 2 wks onest mild pain and achilles insertion site swelling to the right heel.  Asking also for handicap parking application signed.  Has not been taking statin recently.    Wt Readings from Last 3 Encounters:  01/26/23 216 lb (98 kg)  07/27/22 216 lb (98 kg)  02/08/22 221 lb 12.8 oz (100.6 kg)   BP Readings from Last 3 Encounters:  01/26/23 132/82  07/27/22 138/84  02/08/22 (!) 164/108   Immunization History  Administered Date(s) Administered   Fluad Quad(high Dose 65+) 08/12/2019, 12/30/2020, 07/26/2021, 07/27/2022   Influenza Split 08/03/2011, 09/13/2012   Influenza Whole 08/12/2008, 08/12/2009, 08/19/2010   Influenza, High Dose Seasonal PF 09/05/2013, 09/07/2016, 09/06/2017, 09/28/2018   Influenza,inj,Quad PF,6+ Mos 08/18/2014, 08/07/2015   PFIZER(Purple Top)SARS-COV-2 Vaccination 11/15/2019, 12/12/2019, 08/26/2020   Pneumococcal Conjugate-13 08/18/2014   Pneumococcal Polysaccharide-23 09/10/2007, 07/07/2020   Td 10/21/2008   Tdap 05/09/2019   Zoster, Live 02/04/2010   There are no preventive care reminders to display for this patient.     Past Medical History:  Diagnosis Date   Allergy    Arthritis    knees   Breast cancer ([place])    Right   HTN  (hypertension)    Hyperlipidemia    Hypertension    Irritable bladder    Knee pain    Bilateral   Personal history of radiation therapy    SVD (spontaneous vaginal delivery)    x 2   Type II or unspecified type diabetes mellitus without mention of complication, not stated as uncontrolled    Past Surgical History:  Procedure Laterality Date   ABDOMINAL HYSTERECTOMY  1975   BREAST LUMPECTOMY Right 1987   COLONOSCOPY  05/10/05, 2011   hx polyps   MASTECTOMY Left 2016   MASTECTOMY PARTIAL / LUMPECTOMY W/ AXILLARY LYMPHADENECTOMY  07/1986   MASTECTOMY W/ SENTINEL NODE BIOPSY Left 10/28/2015   Procedure: LEFT MASTECTOMY WITH SENTINEL LYMPH NODE BIOPSY;  Surgeon: Stark Klein, MD;  Location: [facility];  Service: General;  Laterality: Left;   SHOULDER ARTHROSCOPY  2000    reports that she has never smoked. She has never used smokeless tobacco. She reports current alcohol use. She reports that she does not use drugs. family history includes Breast cancer in her sister; Breast cancer (age of onset: 83) in her sister; Cancer in her brother and cousin; Corneal Dystrophy in her son; Diabetes (age of onset: 80) in her father and mother; Esophageal cancer in her maternal grandfather; Healthy in her daughter; Heart disease in her father; Heart failure in her father; Pancreatic cancer in  her mother; Rectal cancer in her maternal grandfather; Stomach cancer in her maternal grandfather. Allergies  Allergen Reactions   Hydrocodone-Acetaminophen Other (See Comments)    Mental confusion   Metformin And Related Diarrhea   Pravastatin Other (See Comments)    Severe muscle cramps.   Lisinopril     cough   Current Outpatient Medications on File Prior to Visit  Medication Sig Dispense Refill   ACCU-CHEK AVIVA PLUS test strip USE TO CHECK BLOOD SUGAR ONCE DAILY. Dx: E11.9 100 each 0   aspirin 325 MG tablet Take 325 mg by mouth daily.     Blood Glucose Monitoring Suppl (ACCU-CHEK AVIVA PLUS)  w/Device KIT USE AS DIRECTED TO CHECK GLUCOSE 1 kit 0   Calcium Carbonate-Vitamin D 600-400 MG-UNIT tablet Take 1 tablet by mouth 2 (two) times daily.     ezetimibe (ZETIA) 10 MG tablet Take 1 tablet (10 mg total) by mouth every other day. 45 tablet 3   fluticasone (FLONASE) 50 MCG/ACT nasal spray Place 2 sprays into both nostrils daily. 16 g 2   losartan-hydrochlorothiazide (HYZAAR) 50-12.5 MG tablet Take 1 tablet by mouth once daily 90 tablet 0   metoprolol succinate (TOPROL-XL) 25 MG 24 hr tablet Take 1 tablet by mouth once daily 90 tablet 3   nabumetone (RELAFEN) 500 MG tablet TAKE 1 TABLET BY MOUTH TWICE DAILY AS NEEDED AFTER A MEAL FOR ARTHRITIS 60 tablet 2   oxybutynin (DITROPAN) 5 MG tablet Take 1 tablet by mouth 4 times daily 360 tablet 3   No current facility-administered medications on file prior to visit.        ROS:  All others reviewed and negative.  Objective        PE:  BP 132/82   Pulse 63   Temp 97.7 F (36.5 C) (Oral)   Ht 5\' 5"  (1.651 m)   Wt 216 lb (98 kg)   SpO2 99%   BMI 35.94 kg/m                 Constitutional: Pt appears in NAD               HENT: Head: NCAT.                Right Ear: External ear normal.                 Left Ear: External ear normal.                Eyes: . Pupils are equal, round, and reactive to light. Conjunctivae and EOM are normal               Nose: without d/c or deformity               Neck: Neck supple. Gross normal ROM               Cardiovascular: Normal rate and regular rhythm.                 Pulmonary/Chest: Effort normal and breath sounds without rales or wheezing.                Abd:  Soft, NT, ND, + BS, no organomegaly               Neurological: Pt is alert. At baseline orientation, motor grossly intact               Skin: Skin is warm. No rashes, no other new lesions,  LE edema - none; has tender and firm swelling at the right achilles insertion site               Psychiatric: Pt behavior is normal without agitation    Micro: none  Cardiac tracings I have personally interpreted today:  none  Pertinent Radiological findings (summarize): none   Lab Results  Component Value Date   WBC 4.4 01/24/2023   HGB 14.3 01/24/2023   HCT 42.5 01/24/2023   PLT 207.0 01/24/2023   GLUCOSE 113 (H) 01/24/2023   CHOL 212 (H) 01/24/2023   TRIG 139.0 01/24/2023   HDL 46.60 01/24/2023   LDLDIRECT 111.5 01/01/2007   LDLCALC 138 (H) 01/24/2023   ALT 13 01/24/2023   AST 16 01/24/2023   NA 140 01/24/2023   K 4.1 01/24/2023   CL 105 01/24/2023   CREATININE 0.79 01/24/2023   BUN 14 01/24/2023   CO2 28 01/24/2023   TSH 1.67 01/24/2023   HGBA1C 6.3 01/24/2023   MICROALBUR 1.0 01/24/2023   Assessment/Plan:  Kelly Bell is a 82 y.o. Black or African American [2] female with  has a past medical history of Allergy, Arthritis, Breast cancer ([place]), HTN (hypertension), Hyperlipidemia, Hypertension, Irritable bladder, Knee pain, Personal history of radiation therapy, SVD (spontaneous vaginal delivery), and Type II or unspecified type diabetes mellitus without mention of complication, not stated as uncontrolled.  Encounter for well adult exam with abnormal findings Age and sex appropriate education and counseling updated with regular exercise and diet Referrals for preventative services - none needed Immunizations addressed - declines covid booster, for shingrix at pharmacy Smoking counseling  - none needed Evidence for depression or other mood disorder - none significant Most recent labs reviewed. I have personally reviewed and have noted: 1) the patient's medical and social history 2) The patient's current medications and supplements 3) The patient's height, weight, and BMI have been recorded in the chart   B12 deficiency Lab Results  Component Value Date   VITAMINB12 >1500 (H) 01/24/2023   Stable, cont oral replacement - b12 1000 mcg qd   Diabetes type 2, controlled ([place]) Lab Results  Component Value Date    HGBA1C 6.3 01/24/2023   Stable, pt to continue current medical treatment  - diet, wt control   Essential hypertension BP Readings from Last 3 Encounters:  01/26/23 132/82  07/27/22 138/84  02/08/22 (!) 164/108   Stable, pt to continue medical treatment hyzaar 50-12.5, toprol xl 25 qd   Hyperlipidemia Lab Results  Component Value Date   LDLCALC 138 (H) 01/24/2023   Uncontrolled, pt to restart zetia 10 mg qd, has not been able to take statin in past, zetia not likely related to achilles tendonitis   Tendonitis, Achilles, right Mild, 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  Cathlean Cower, MD 01/28/2023 5:47 PM [facility]

## 2023-01-26 NOTE — Patient Instructions (Addendum)
Please have your Shingrix (shingles) shots done at your local pharmacy.  Please continue all other medications as before, including to take the statin every day  Please have the pharmacy call with any other refills you may need.  Please continue your efforts at being more active, low cholesterol diet, and weight control.  You are otherwise up to date with prevention measures today.  Please keep your appointments with your specialists as you may have planned  You are given the Handicapped parking application signed  Your lab work was otherwise very good  Please make an Appointment to return in 6 months, or sooner if needed, also with Lab Appointment for testing done 3-5 days before at the [facility] (so this is for TWO appointments - please see the scheduling desk as you leave)

## 2023-01-28 ENCOUNTER — Encounter: Payer: Self-pay | Admitting: Internal Medicine

## 2023-01-28 DIAGNOSIS — M7661 Achilles tendinitis, right leg: Secondary | ICD-10-CM | POA: Insufficient documentation

## 2023-01-28 NOTE — Addendum Note (Signed)
Addended by: Biagio Borg on: 01/28/2023 05:48 PM   Modules accepted: Orders

## 2023-01-28 NOTE — Assessment & Plan Note (Signed)
Lab Results  Component Value Date   LDLCALC 138 (H) 01/24/2023   Uncontrolled, pt to restart zetia 10 mg qd, has not been able to take statin in past, zetia not likely related to achilles tendonitis

## 2023-01-28 NOTE — Assessment & Plan Note (Addendum)
Mild, likely chronic recurrent, for otc voltaren gel prn, handicapped parking application signed

## 2023-01-28 NOTE — Assessment & Plan Note (Signed)
Lab Results  Component Value Date   VITAMINB12 >1500 (H) 01/24/2023   Stable, cont oral replacement - b12 1000 mcg qd

## 2023-01-28 NOTE — Assessment & Plan Note (Signed)
Lab Results  Component Value Date   HGBA1C 6.3 01/24/2023   Stable, pt to continue current medical treatment  - diet, wt control

## 2023-01-28 NOTE — Assessment & Plan Note (Signed)
Age and sex appropriate education and counseling updated with regular exercise and diet Referrals for preventative services - none needed Immunizations addressed - declines covid booster, for shingrix at pharmacy Smoking counseling  - none needed Evidence for depression or other mood disorder - none significant Most recent labs reviewed. I have personally reviewed and have noted: 1) the patient's medical and social history 2) The patient's current medications and supplements 3) The patient's height, weight, and BMI have been recorded in the chart  

## 2023-01-28 NOTE — Assessment & Plan Note (Signed)
BP Readings from Last 3 Encounters:  01/26/23 132/82  07/27/22 138/84  02/08/22 (!) 164/108   Stable, pt to continue medical treatment hyzaar 50-12.5, toprol xl 25 qd

## 2023-01-30 ENCOUNTER — Other Ambulatory Visit: Payer: Self-pay | Admitting: Internal Medicine

## 2023-02-04 ENCOUNTER — Telehealth: Payer: Medicare PPO | Admitting: Nurse Practitioner

## 2023-02-04 DIAGNOSIS — J40 Bronchitis, not specified as acute or chronic: Secondary | ICD-10-CM

## 2023-02-04 MED ORDER — FLUTICASONE PROPIONATE 50 MCG/ACT NA SUSP: 2.0000 | Freq: Every day | NASAL | 0 refills | Status: DC

## 2023-02-04 MED ORDER — AZITHROMYCIN 250 MG PO TABS: ORAL_TABLET | ORAL | 0 refills | Status: AC

## 2023-02-04 NOTE — Progress Notes (Signed)
I have spent 5 minutes in review of e-visit questionnaire, review and updating patient chart, medical decision making and response to patient.  ° °Dorrell Mitcheltree W Lynnlee Revels, NP ° °  °

## 2023-02-04 NOTE — Progress Notes (Signed)
E-Visit for Cough  We are sorry that you are not feeling well.  Here is how we plan to help!  Based on your presentation I believe you most likely have BRONCHITIS   In addition you may use OVER THE COUNTER COUGH SYRUP CORICIDIN HBP  I have also sent an antibiotic for you to take for the next 5 days and a nose spray  From your responses in the eVisit questionnaire you describe inflammation in the upper respiratory tract which is causing a significant cough.  This is commonly called Bronchitis and has four common causes:   Allergies Viral Infections Acid Reflux Bacterial Infection Allergies, viruses and acid reflux are treated by controlling symptoms or eliminating the cause. An example might be a cough caused by taking certain blood pressure medications. You stop the cough by changing the medication. Another example might be a cough caused by acid reflux. Controlling the reflux helps control the cough.  USE OF BRONCHODILATOR ("RESCUE") INHALERS: There is a risk from using your bronchodilator too frequently.  The risk is that over-reliance on a medication which only relaxes the muscles surrounding the breathing tubes can reduce the effectiveness of medications prescribed to reduce swelling and congestion of the tubes themselves.  Although you feel brief relief from the bronchodilator inhaler, your asthma may actually be worsening with the tubes becoming more swollen and filled with mucus.  This can delay other crucial treatments, such as oral steroid medications. If you need to use a bronchodilator inhaler daily, several times per day, you should discuss this with your provider.  There are probably better treatments that could be used to keep your asthma under control.     HOME CARE Only take medications as instructed by your medical team. Complete the entire course of an antibiotic. Drink plenty of fluids and get plenty of rest. Avoid close contacts especially the very young and the  elderly Cover your mouth if you cough or cough into your sleeve. Always remember to wash your hands A steam or ultrasonic humidifier can help congestion.   GET HELP RIGHT AWAY IF: You develop worsening fever. You become short of breath You cough up blood. Your symptoms persist after you have completed your treatment plan MAKE SURE YOU  Understand these instructions. Will watch your condition. Will get help right away if you are not doing well or get worse.    Thank you for choosing an e-visit.  Your e-visit answers were reviewed by a board certified advanced clinical practitioner to complete your personal care plan. Depending upon the condition, your plan could have included both over the counter or prescription medications.  Please review your pharmacy choice. Make sure the pharmacy is open so you can pick up prescription now. If there is a problem, you may contact your provider through [employer] and have the prescription routed to another pharmacy.  Your safety is important to [place]. If you have drug allergies check your prescription carefully.   For the next 24 hours you can use MyChart to ask questions about today's visit, request a non-urgent call back, or ask for a work or school excuse. You will get an email in the next two days asking about your experience. I hope that your e-visit has been valuable and will speed your recovery.

## 2023-03-22 ENCOUNTER — Ambulatory Visit: Payer: Medicare PPO | Admitting: Family Medicine

## 2023-03-22 ENCOUNTER — Encounter: Payer: Self-pay | Admitting: Family Medicine

## 2023-03-22 VITALS — BP 138/72 | HR 62 | Temp 97.6°F | Ht 65.0 in | Wt 208.0 lb

## 2023-03-22 DIAGNOSIS — R0982 Postnasal drip: Secondary | ICD-10-CM

## 2023-03-22 DIAGNOSIS — R0981 Nasal congestion: Secondary | ICD-10-CM | POA: Diagnosis not present

## 2023-03-22 DIAGNOSIS — J309 Allergic rhinitis, unspecified: Secondary | ICD-10-CM

## 2023-03-22 MED ORDER — AZELASTINE HCL 0.1 % NA SOLN: 2.0000 | Freq: Two times a day (BID) | NASAL | 1 refills | Status: AC

## 2023-03-22 MED ORDER — LEVOCETIRIZINE DIHYDROCHLORIDE 5 MG PO TABS: 5.0000 mg | ORAL_TABLET | Freq: Every evening | ORAL | 1 refills | Status: AC

## 2023-03-22 NOTE — Progress Notes (Signed)
Subjective:  Kelly Bell is a 82 y.o. female who presents for a several week hx of bilateral ear pain, nasal congestion, post nasal drainage and mild cough.   Denies fever, chills, dizziness, headache, ST, chest pain, palpitations, shortness of breath, abdominal pain, N/V/D, LE edema.    ROS as in subjective.   Objective: Vitals:   03/22/23 1052  BP: 138/72  Pulse: 62  Temp: 97.6 F (36.4 C)  SpO2: 98%    General appearance: Alert, WD/WN, no distress                             Skin: warm, no rash                           Head: no sinus tenderness                            Eyes: conjunctiva normal, corneas clear, PERRLA                            Ears: pearly TMs, external ear canals normal                          Nose: septum midline, turbinates swollen, with pallor and clear discharge             Mouth/throat: MMM, tongue normal, mild pharyngeal erythema                           Neck: supple, no adenopathy, no thyromegaly, nontender                          Heart: RRR                          Lungs: CTA bilaterally, no wheezes, rales, or rhonchi      Assessment: Allergic rhinitis, unspecified seasonality, unspecified trigger - Plan: levocetirizine (XYZAL) 5 MG tablet, azelastine (ASTELIN) 0.1 % nasal spray  Post-nasal drainage  Nasal congestion - Plan: azelastine (ASTELIN) 0.1 % nasal spray   Plan: Reviewed notes from E-visit. She completed a course of antibiotics without relief of current symptoms.  Discussed allergy treatment. Prescribed Xyzal and Astelin nasal spray. No sign of infection.  Call/return in 1-2 weeks if symptoms aren't resolving.

## 2023-03-22 NOTE — Patient Instructions (Addendum)
Your symptoms appear to be related to allergies.   I prescribed an allergy medication to take by mouth as well as a nasal spray.  If these are cheaper to buy over-the-counter than your prescription then buy them over-the-counter.  Follow-up if you are not improving with this treatment.

## 2023-04-20 ENCOUNTER — Other Ambulatory Visit: Payer: Self-pay | Admitting: Internal Medicine

## 2023-04-20 ENCOUNTER — Other Ambulatory Visit: Payer: Self-pay

## 2023-05-10 ENCOUNTER — Ambulatory Visit
Admission: RE | Admit: 2023-05-10 | Discharge: 2023-05-10 | Disposition: A | Discharge status: Home / Self Care | Payer: Medicare PPO | Source: Ambulatory Visit | Attending: Internal Medicine | Admitting: Internal Medicine

## 2023-05-10 DIAGNOSIS — Z853 Personal history of malignant neoplasm of breast: Secondary | ICD-10-CM | POA: Diagnosis not present

## 2023-05-10 DIAGNOSIS — R921 Mammographic calcification found on diagnostic imaging of breast: Secondary | ICD-10-CM

## 2023-05-13 ENCOUNTER — Other Ambulatory Visit: Payer: Self-pay | Admitting: Internal Medicine

## 2023-05-15 ENCOUNTER — Other Ambulatory Visit: Payer: Self-pay

## 2023-05-19 ENCOUNTER — Other Ambulatory Visit: Payer: Self-pay | Admitting: Internal Medicine

## 2023-05-19 DIAGNOSIS — R921 Mammographic calcification found on diagnostic imaging of breast: Secondary | ICD-10-CM

## 2023-05-22 ENCOUNTER — Other Ambulatory Visit: Payer: Self-pay | Admitting: Family Medicine

## 2023-05-22 DIAGNOSIS — J309 Allergic rhinitis, unspecified: Secondary | ICD-10-CM

## 2023-05-24 ENCOUNTER — Other Ambulatory Visit: Payer: Self-pay | Admitting: Internal Medicine

## 2023-06-29 ENCOUNTER — Encounter (INDEPENDENT_AMBULATORY_CARE_PROVIDER_SITE_OTHER): Payer: Self-pay

## 2023-07-04 ENCOUNTER — Encounter: Payer: Self-pay | Admitting: Internal Medicine

## 2023-07-04 ENCOUNTER — Ambulatory Visit (INDEPENDENT_AMBULATORY_CARE_PROVIDER_SITE_OTHER): Payer: Medicare PPO | Admitting: Internal Medicine

## 2023-07-04 VITALS — BP 130/82 | HR 50 | Temp 98.0°F | Ht 65.0 in | Wt 207.0 lb

## 2023-07-04 DIAGNOSIS — E559 Vitamin D deficiency, unspecified: Secondary | ICD-10-CM

## 2023-07-04 DIAGNOSIS — I10 Essential (primary) hypertension: Secondary | ICD-10-CM | POA: Diagnosis not present

## 2023-07-04 DIAGNOSIS — J309 Allergic rhinitis, unspecified: Secondary | ICD-10-CM | POA: Diagnosis not present

## 2023-07-04 DIAGNOSIS — E538 Deficiency of other specified B group vitamins: Secondary | ICD-10-CM | POA: Diagnosis not present

## 2023-07-04 DIAGNOSIS — E7800 Pure hypercholesterolemia, unspecified: Secondary | ICD-10-CM

## 2023-07-04 DIAGNOSIS — E1165 Type 2 diabetes mellitus with hyperglycemia: Secondary | ICD-10-CM | POA: Diagnosis not present

## 2023-07-04 LAB — LIPID PANEL
Cholesterol: 220 mg/dL — ABNORMAL HIGH (ref 0–200)
HDL: 41.5 mg/dL (ref >39.00)
LDL Cholesterol: 141 mg/dL — ABNORMAL HIGH (ref 0–99)
NonHDL: 178.94
Total CHOL/HDL Ratio: 5
Triglycerides: 188 mg/dL — ABNORMAL HIGH (ref 0.0–149.0)
VLDL: 37.6 mg/dL (ref 0.0–40.0)

## 2023-07-04 LAB — BASIC METABOLIC PANEL
BUN: 12 mg/dL (ref 6–23)
CO2: 29 mEq/L (ref 19–32)
Calcium: 10.1 mg/dL (ref 8.4–10.5)
Chloride: 105 mEq/L (ref 96–112)
Creatinine, Ser: 0.71 mg/dL (ref 0.40–1.20)
GFR: 78.61 mL/min (ref >60.00)
Glucose, Bld: 124 mg/dL — ABNORMAL HIGH (ref 70–99)
Potassium: 4.4 mEq/L (ref 3.5–5.1)
Sodium: 140 mEq/L (ref 135–145)

## 2023-07-04 LAB — HEMOGLOBIN A1C: Hgb A1c MFr Bld: 6.2 % (ref 4.6–6.5)

## 2023-07-04 LAB — VITAMIN D 25 HYDROXY (VIT D DEFICIENCY, FRACTURES): VITD: 42.89 ng/mL (ref 30.00–100.00)

## 2023-07-04 NOTE — Progress Notes (Signed)
The test results show that your current treatment is OK, as the tests are stable.  Please continue the same plan.  There is no other need for change of treatment or further evaluation based on these results, at this time.  thanks 

## 2023-07-04 NOTE — Patient Instructions (Signed)
Please continue all other medications as before, and refills have been done if requested.  Please have the pharmacy call with any other refills you may need.  Please continue your efforts at being more active, low cholesterol diet, and weight control  Please keep your appointments with your specialists as you may have planned  Please go to the LAB at the blood drawing area for the tests to be done  You will be contacted by phone if any changes need to be made immediately.  Otherwise, you will receive a letter about your results with an explanation, but please check with MyChart first.  Please remember to sign up for MyChart if you have not done so, as this will be important to you in the future with finding out test results, communicating by private email, and scheduling acute appointments online when needed.  Please make an Appointment to return in 6 months, or sooner if needed 

## 2023-07-04 NOTE — Progress Notes (Signed)
Patient ID: Kelly Bell, female   DOB: May 31, 1941, 82 y.o.   MRN: [id number]        Chief Complaint: follow up HTN, DM, low vit d, allergies, left ear eustachian dysfunction       HPI:  Kelly Bell is a 82 y.o. female here Does have several wks ongoing nasal allergy symptoms with clearish congestion, itch and sneezing, without fever, pain, ST, cough, swelling or wheezing.  Has left ear popping and crackling.  Only taking astelin prn and not very often. Wt less with less appetite, not really trying, gets full faster it seems. Denies worsening reflux, abd pain, dysphagia, n/v, bowel change or blood.hsa ongoing right knee arthritis, not quite bad enough for another cortisone per pt, last shot with Dr Ophelia Charter over 3 yrs ago.  Currently taking 3 x per wk zetia.  Pt denies chest pain, increased sob or doe, wheezing, orthopnea, PND, increased LE swelling, palpitations, dizziness or syncope.   Pt denies polydipsia, polyuria, or new focal neuro s/s.     Wt Readings from Last 3 Encounters:  07/04/23 207 lb (93.9 kg)  03/22/23 208 lb (94.3 kg)  01/26/23 216 lb (98 kg)   BP Readings from Last 3 Encounters:  07/04/23 130/82  03/22/23 138/72  01/26/23 132/82         Past Medical History:  Diagnosis Date   Allergy    Arthritis    knees   Breast cancer (HCC)    Right   HTN (hypertension)    Hyperlipidemia    Hypertension    Irritable bladder    Knee pain    Bilateral   Personal history of radiation therapy    SVD (spontaneous vaginal delivery)    x 2   Type II or unspecified type diabetes mellitus without mention of complication, not stated as uncontrolled    Past Surgical History:  Procedure Laterality Date   ABDOMINAL HYSTERECTOMY  1975   BREAST LUMPECTOMY Right 1987   COLONOSCOPY  05/10/05, 2011   hx polyps   MASTECTOMY Left 2016   MASTECTOMY PARTIAL / LUMPECTOMY W/ AXILLARY LYMPHADENECTOMY  07/1986   MASTECTOMY W/ SENTINEL NODE BIOPSY Left 10/28/2015   Procedure: LEFT MASTECTOMY WITH  SENTINEL LYMPH NODE BIOPSY;  Surgeon: Almond Lint, MD;  Location: [facility];  Service: General;  Laterality: Left;   SHOULDER ARTHROSCOPY  2000    reports that she has never smoked. She has never used smokeless tobacco. She reports current alcohol use. She reports that she does not use drugs. family history includes Breast cancer in her sister; Breast cancer (age of onset: 52) in her sister; Cancer in her brother and cousin; Corneal Dystrophy in her son; Diabetes (age of onset: 74) in her father and mother; Esophageal cancer in her maternal grandfather; Healthy in her daughter; Heart disease in her father; Heart failure in her father; Pancreatic cancer in her mother; Rectal cancer in her maternal grandfather; Stomach cancer in her maternal grandfather. Allergies  Allergen Reactions   Hydrocodone-Acetaminophen Other (See Comments)    Mental confusion   Metformin And Related Diarrhea   Pravastatin Other (See Comments)    Severe muscle cramps.   Lisinopril     cough   Current Outpatient Medications on File Prior to Visit  Medication Sig Dispense Refill   ACCU-CHEK AVIVA PLUS test strip USE TO CHECK BLOOD SUGAR ONCE DAILY. Dx: E11.9 100 each 0   aspirin 325 MG tablet Take 325 mg by mouth daily.  azelastine (ASTELIN) 0.1 % nasal spray Place 2 sprays into both nostrils 2 (two) times daily. Use in each nostril as directed 30 mL 1   Blood Glucose Monitoring Suppl (ACCU-CHEK AVIVA PLUS) w/Device KIT USE AS DIRECTED TO CHECK GLUCOSE 1 kit 0   Calcium Carbonate-Vitamin D 600-400 MG-UNIT tablet Take 1 tablet by mouth 2 (two) times daily.     ezetimibe (ZETIA) 10 MG tablet TAKE 1 TABLET BY MOUTH EVERY OTHER DAY 45 tablet 3   levocetirizine (XYZAL) 5 MG tablet Take 1 tablet (5 mg total) by mouth every evening. 30 tablet 1   losartan-hydrochlorothiazide (HYZAAR) 50-12.5 MG tablet Take 1 tablet by mouth once daily 90 tablet 0   metoprolol succinate (TOPROL-XL) 25 MG 24 hr tablet Take 1  tablet by mouth once daily 90 tablet 3   nabumetone (RELAFEN) 500 MG tablet TAKE 1 TABLET BY MOUTH TWICE DAILY AS NEEDED AFTER A MEAL FOR ARTHRITIS 60 tablet 0   oxybutynin (DITROPAN) 5 MG tablet Take 1 tablet by mouth 4 times daily 360 tablet 3   No current facility-administered medications on file prior to visit.        ROS:  All others reviewed and negative.  Objective        PE:  BP 130/82 (BP Location: Left Arm, Patient Position: Sitting, Cuff Size: Normal)   Pulse (!) 50   Temp 98 F (36.7 C) (Oral)   Ht 5\' 5"  (1.651 m)   Wt 207 lb (93.9 kg)   SpO2 98%   BMI 34.45 kg/m                 Constitutional: Pt appears in NAD               HENT: Head: NCAT.                Right Ear: External ear normal.                 Left Ear: External ear normal. Bilat tm's with mild erythema.  Max sinus areas non tender.  Pharynx with mild erythema, no exudate               Eyes: . Pupils are equal, round, and reactive to light. Conjunctivae and EOM are normal               Nose: without d/c or deformity               Neck: Neck supple. Gross normal ROM               Cardiovascular: Normal rate and regular rhythm.                 Pulmonary/Chest: Effort normal and breath sounds without rales or wheezing.                Abd:  Soft, NT, ND, + BS, no organomegaly               Neurological: Pt is alert. At baseline orientation, motor grossly intact               Skin: Skin is warm. No rashes, no other new lesions, LE edema - none               Psychiatric: Pt behavior is normal without agitation   Micro: none  Cardiac tracings I have personally interpreted today:  none  Pertinent Radiological findings (summarize): none   Lab Results  Component  Value Date   WBC 4.4 01/24/2023   HGB 14.3 01/24/2023   HCT 42.5 01/24/2023   PLT 207.0 01/24/2023   GLUCOSE 124 (H) 07/04/2023   CHOL 220 (H) 07/04/2023   TRIG 188.0 (H) 07/04/2023   HDL 41.50 07/04/2023   LDLDIRECT 111.5 01/01/2007   LDLCALC  141 (H) 07/04/2023   ALT 13 01/24/2023   AST 16 01/24/2023   NA 140 07/04/2023   K 4.4 07/04/2023   CL 105 07/04/2023   CREATININE 0.71 07/04/2023   BUN 12 07/04/2023   CO2 29 07/04/2023   TSH 1.67 01/24/2023   HGBA1C 6.2 07/04/2023   MICROALBUR 1.0 01/24/2023   Assessment/Plan:  Kelly Bell is a 82 y.o. Black or African American [2] female with  has a past medical history of Allergy, Arthritis, Breast cancer (HCC), HTN (hypertension), Hyperlipidemia, Hypertension, Irritable bladder, Knee pain, Personal history of radiation therapy, SVD (spontaneous vaginal delivery), and Type II or unspecified type diabetes mellitus without mention of complication, not stated as uncontrolled.  Diabetes type 2, controlled (HCC) Lab Results  Component Value Date   HGBA1C 6.2 07/04/2023   Stable, pt to continue current medical treatment  - diet,wt controlled   Essential hypertension BP Readings from Last 3 Encounters:  07/04/23 130/82  03/22/23 138/72  01/26/23 132/82   Stable, pt to continue medical treatment hyzaar 50 12.5 every day, toprol xl 25 qd   Vitamin D deficiency Last vitamin D Lab Results  Component Value Date   VD25OH 42.89 07/04/2023   Stable, cont oral replacement   Allergic rhinitis Mild to mod, for astelin daily instead of prn, decilines other steroid tx at this time,to f/u any worsening symptoms or concerns  B12 deficiency Lab Results  Component Value Date   VITAMINB12 >1500 (H) 01/24/2023   Stable, cont oral replacement - b12 1000 mcg qd   Hyperlipidemia Lab Results  Component Value Date   LDLCALC 141 (H) 07/04/2023   Uncontrolled, for lower chol diet, pt to cont zetia 3 x weekly as this is all she will take, declines other change  Followup: Return in about 6 months (around 01/04/2024).  Oliver Barre, MD 07/07/2023 9:16 PM [facility]

## 2023-07-07 ENCOUNTER — Encounter: Payer: Self-pay | Admitting: Internal Medicine

## 2023-07-07 NOTE — Assessment & Plan Note (Signed)
Last vitamin D Lab Results  Component Value Date   VD25OH 42.89 07/04/2023   Stable, cont oral replacement

## 2023-07-07 NOTE — Assessment & Plan Note (Signed)
Mild to mod, for astelin daily instead of prn, decilines other steroid tx at this time,to f/u any worsening symptoms or concerns

## 2023-07-07 NOTE — Assessment & Plan Note (Signed)
Lab Results  Component Value Date   HGBA1C 6.2 07/04/2023   Stable, pt to continue current medical treatment  - diet,wt controlled

## 2023-07-07 NOTE — Assessment & Plan Note (Signed)
Lab Results  Component Value Date   LDLCALC 141 (H) 07/04/2023   Uncontrolled, for lower chol diet, pt to cont zetia 3 x weekly as this is all she will take, declines other change

## 2023-07-07 NOTE — Assessment & Plan Note (Signed)
Lab Results  Component Value Date   VITAMINB12 >1500 (H) 01/24/2023   Stable, cont oral replacement - b12 1000 mcg qd

## 2023-07-07 NOTE — Assessment & Plan Note (Signed)
BP Readings from Last 3 Encounters:  07/04/23 130/82  03/22/23 138/72  01/26/23 132/82   Stable, pt to continue medical treatment hyzaar 50 12.5 every day, toprol xl 25 qd

## 2023-07-21 ENCOUNTER — Other Ambulatory Visit: Payer: Self-pay | Admitting: Family Medicine

## 2023-07-21 ENCOUNTER — Other Ambulatory Visit: Payer: Self-pay

## 2023-07-21 ENCOUNTER — Other Ambulatory Visit: Payer: Self-pay | Admitting: Internal Medicine

## 2023-07-21 DIAGNOSIS — J309 Allergic rhinitis, unspecified: Secondary | ICD-10-CM

## 2023-07-24 ENCOUNTER — Ambulatory Visit (INDEPENDENT_AMBULATORY_CARE_PROVIDER_SITE_OTHER): Payer: Medicare PPO

## 2023-07-24 VITALS — Ht 65.0 in | Wt 207.0 lb

## 2023-07-24 DIAGNOSIS — Z0000 Encounter for general adult medical examination without abnormal findings: Secondary | ICD-10-CM | POA: Diagnosis not present

## 2023-07-24 NOTE — Patient Instructions (Signed)
Kelly Bell , Thank you for taking time to come for your Medicare Wellness Visit. I appreciate your ongoing commitment to your health goals. Please review the following plan we discussed and let me know if I can assist you in the future.   Referrals/Orders/Follow-Ups/Clinician Recommendations: No  This is a list of the screening recommended for you and due dates:  Health Maintenance  Topic Date Due   Zoster (Shingles) Vaccine (1 of 2) 01/19/1959   Flu Shot  06/15/2023   COVID-19 Vaccine (4 - 2023-24 season) 07/16/2023   Eye exam for diabetics  12/27/2023   Hemoglobin A1C  01/04/2024   Yearly kidney health urinalysis for diabetes  01/24/2024   Yearly kidney function blood test for diabetes  07/03/2024   Complete foot exam   07/03/2024   Medicare Annual Wellness Visit  07/23/2024   DTaP/Tdap/Td vaccine (3 - Td or Tdap) 05/08/2029   Pneumonia Vaccine  Completed   DEXA scan (bone density measurement)  Completed   HPV Vaccine  Aged Out    Advanced directives: (Declined) Advance directive discussed with you today. Even though you declined this today, please call our office should you change your mind, and we can give you the proper paperwork for you to fill out.  Next Medicare Annual Wellness Visit scheduled for next year: Yes

## 2023-07-24 NOTE — Progress Notes (Signed)
Subjective:   Kelly Bell is a 82 y.o. female who presents for Medicare Annual (Subsequent) preventive examination.  Visit Complete: Virtual  I connected with  Kelly Bell on 07/24/23 by a audio enabled telemedicine application and verified that I am speaking with the correct person using two identifiers.  Patient Location: Home  Provider Location: Office/Clinic  I discussed the limitations of evaluation and management by telemedicine. The patient expressed understanding and agreed to proceed.  Vital Signs: Because this visit was a virtual/telehealth visit, some criteria may be missing or patient reported. Any vitals not documented were not able to be obtained and vitals that have been documented are patient reported.   Patient provided his/her weight during this virtual phone visit.  Review of Systems     Cardiac Risk Factors include: advanced age (>2men, >70 women);diabetes mellitus;dyslipidemia;family history of premature cardiovascular disease;hypertension;obesity (BMI >30kg/m2)     Objective:    Today's Vitals   07/24/23 1537  Weight: 207 lb (93.9 kg)  Height: 5\' 5"  (1.651 m)  PainSc: 0-No pain   Body mass index is 34.45 kg/m.     07/24/2023    3:38 PM 07/20/2022    3:36 PM 06/16/2021   11:30 AM 10/08/2018   11:20 AM 02/12/2018    1:34 PM 06/14/2016    1:27 PM 12/09/2015    1:54 PM  Advanced Directives  Does Patient Have a Medical Advance Directive? No No No No No No No  Does patient want to make changes to medical advance directive?    Yes (ED - Information included in AVS)     Would patient like information on creating a medical advance directive? No - Patient declined No - Patient declined Yes (MAU/Ambulatory/Procedural Areas - Information given)    No - patient declined information    Current Medications (verified) Outpatient Encounter Medications as of 07/24/2023  Medication Sig   ACCU-CHEK AVIVA PLUS test strip USE TO CHECK BLOOD SUGAR ONCE DAILY. Dx: E11.9    aspirin 325 MG tablet Take 325 mg by mouth daily.   azelastine (ASTELIN) 0.1 % nasal spray Place 2 sprays into both nostrils 2 (two) times daily. Use in each nostril as directed   Blood Glucose Monitoring Suppl (ACCU-CHEK AVIVA PLUS) w/Device KIT USE AS DIRECTED TO CHECK GLUCOSE   Calcium Carbonate-Vitamin D 600-400 MG-UNIT tablet Take 1 tablet by mouth 2 (two) times daily.   ezetimibe (ZETIA) 10 MG tablet TAKE 1 TABLET BY MOUTH EVERY OTHER DAY   levocetirizine (XYZAL) 5 MG tablet Take 1 tablet (5 mg total) by mouth every evening.   losartan-hydrochlorothiazide (HYZAAR) 50-12.5 MG tablet Take 1 tablet by mouth once daily   metoprolol succinate (TOPROL-XL) 25 MG 24 hr tablet Take 1 tablet by mouth once daily   nabumetone (RELAFEN) 500 MG tablet TAKE 1 TABLET BY MOUTH TWICE DAILY AS NEEDED AFTER A MEAL FOR ARTHRITIS   oxybutynin (DITROPAN) 5 MG tablet Take 1 tablet by mouth 4 times daily   No facility-administered encounter medications on file as of 07/24/2023.    Allergies (verified) Hydrocodone-acetaminophen, Metformin and related, Pravastatin, and Lisinopril   History: Past Medical History:  Diagnosis Date   Allergy    Arthritis    knees   Breast cancer (HCC)    Right   HTN (hypertension)    Hyperlipidemia    Hypertension    Irritable bladder    Knee pain    Bilateral   Personal history of radiation therapy    SVD (  spontaneous vaginal delivery)    x 2   Type II or unspecified type diabetes mellitus without mention of complication, not stated as uncontrolled    Past Surgical History:  Procedure Laterality Date   ABDOMINAL HYSTERECTOMY  1975   BREAST LUMPECTOMY Right 1987   COLONOSCOPY  05/10/05, 2011   hx polyps   MASTECTOMY Left 2016   MASTECTOMY PARTIAL / LUMPECTOMY W/ AXILLARY LYMPHADENECTOMY  07/1986   MASTECTOMY W/ SENTINEL NODE BIOPSY Left 10/28/2015   Procedure: LEFT MASTECTOMY WITH SENTINEL LYMPH NODE BIOPSY;  Surgeon: Almond Lint, MD;  Location: [facility];  Service: General;  Laterality: Left;   SHOULDER ARTHROSCOPY  2000   Family History  Problem Relation Age of Onset   Diabetes Mother 58       Deceased   Pancreatic cancer Mother        dx. early 11s   Diabetes Father 73       Deceased   Heart failure Father    Heart disease Father        CHF   Breast cancer Sister 64   Cancer Brother        oral cancer - metastasis to lungs; smoker   Corneal Dystrophy Son        bilateral   Healthy Daughter        x1   Breast cancer Sister        dx. 60s   Esophageal cancer Maternal Grandfather    Rectal cancer Maternal Grandfather    Stomach cancer Maternal Grandfather    Cancer Cousin        oral cancer dx. 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 Married 1965. 1 son - '68, 1 daughter-'72; 3 grandsons. Retired - mfg, Technical sales engineer. SO - pretty good health.               Social Determinants of Health   Financial Resource Strain: Low Risk  (07/24/2023)   Overall Financial Resource Strain (CARDIA)    Difficulty of Paying Living Expenses: Not hard at all  Food Insecurity: No Food Insecurity (07/24/2023)   Hunger Vital Sign    Worried About Running Out of Food in the Last Year: Never true    Ran Out of Food in the Last Year: Never true  Transportation  Needs: No Transportation Needs (07/24/2023)   PRAPARE - Administrator, Civil Service (Medical): No    Lack of Transportation (Non-Medical): No  Physical Activity: Sufficiently Active (07/24/2023)   Exercise Vital Sign    Days of Exercise per Week: 5 days    Minutes of Exercise per Session: 30 min  Stress: No Stress Concern Present (07/24/2023)   [employer]  of Occupational Health - Occupational Stress Questionnaire    Feeling of Stress : Not at all  Social Connections: Socially Integrated (07/24/2023)   Social Connection and Isolation Panel [NHANES]    Frequency of Communication with Friends and Family: More than three times a week    Frequency of Social Gatherings with Friends and Family: More than three times a week    Attends Religious Services: More than 4 times per year    Active Member of [employer] or Organizations: Yes    Attends Engineer, structural: More than 4 times per year    Marital Status: Married    Tobacco Counseling Counseling given: Not Answered   Clinical Intake:  Pre-visit preparation completed: Yes  Pain : No/denies pain Pain Score: 0-No pain     BMI - recorded: 34.45 Nutritional Status: BMI > 30  Obese Nutritional Risks: None Diabetes: Yes CBG done?: No Did pt. bring in CBG monitor from home?: No  How often do you need to have someone help you when you read instructions, pamphlets, or other written materials from your doctor or pharmacy?: 1 - Never What is the last grade level you completed in school?: HSG  Interpreter Needed?: No  Information entered by :: Vern Guerette N. Allani Reber, LPN.   Activities of Daily Living    07/24/2023    3:40 PM  In your present state of health, do you have any difficulty performing the following activities:  Hearing? 0  Vision? 0  Difficulty concentrating or making decisions? 0  Walking or climbing stairs? 0  Dressing or bathing? 0  Doing errands, shopping? 0  Preparing Food and eating ? N  Using  the Toilet? N  In the past six months, have you accidently leaked urine? Y  Do you have problems with loss of bowel control? N  Managing your Medications? N  Managing your Finances? N  Housekeeping or managing your Housekeeping? N    Patient Care Team: Corwin Levins, MD as PCP - General (Internal Medicine) Eldred Manges, MD (Orthopedic Surgery) Almond Lint, MD as Consulting Physician (General Surgery) Magrinat, Valentino Hue, MD (Inactive) as Consulting Physician (Oncology) Dorothy Puffer, MD as Consulting Physician (Radiation Oncology) Donnelly Angelica, RN as Registered Nurse Pershing Proud, RN as Registered Nurse Damita Lack Marcy Salvo, NP as Nurse Practitioner (Hematology and Oncology) Conley Rolls, My [place], [place] as Referring Physician (Optometry)  Indicate any recent Medical Services you may have received from other than [facility] providers in the past year (date may be approximate).     Assessment:   This is a routine wellness examination for Kelly Bell.  Hearing/Vision screen Hearing Screening - Comments:: Patient denied any hearing difficulty.   No hearing aids.  Vision Screening - Comments:: Patient does wear corrective lenses/contacts.  Annual eye exam done by: [facility], OD.    Goals Addressed   None   Depression Screen    07/24/2023    3:39 PM 07/04/2023   11:13 AM 01/26/2023   11:44 AM 07/27/2022   10:37 AM 07/20/2022    3:58 PM 07/20/2022    3:38 PM 01/25/2022   11:14 AM  PHQ 2/9 Scores  PHQ - 2 Score 0 0 0  0 0 0  PHQ- 9 Score 0        Exception Documentation    Patient refusal       Fall Risk    07/24/2023    3:39 PM 07/04/2023   11:13 AM 01/26/2023   11:44 AM 07/27/2022  10:37 AM 07/20/2022    3:37 PM  Fall Risk   Falls in the past year? 0 0 0 0 0  Number falls in past yr: 0 0 0  0  Injury with Fall? 0 0 0 0 0  Risk for fall due to : No Fall Risks No Fall Risks  No Fall Risks Impaired balance/gait;Medication side effect  Follow up Falls prevention discussed Falls evaluation  completed  Falls evaluation completed Falls prevention discussed    MEDICARE RISK AT HOME: Medicare Risk at Home Any stairs in or around the home?: No If so, are there any without handrails?: No Home free of loose throw rugs in walkways, pet beds, electrical cords, etc?: Yes Adequate lighting in your home to reduce risk of falls?: Yes Life alert?: No Use of a cane, walker or w/c?: Yes (trouble ankle) Grab bars in the bathroom?: Yes Shower chair or bench in shower?: No Elevated toilet seat or a handicapped toilet?: No  TIMED UP AND GO:  Was the test performed?  No    Cognitive Function:    10/08/2018   12:13 PM  MMSE - Mini Mental State Exam  Orientation to time 5  Orientation to Place 5  Registration 3  Attention/ Calculation 5  Recall 1  Language- name 2 objects 2  Language- repeat 1  Language- follow 3 step command 3  Language- read & follow direction 1  Write a sentence 1  Copy design 1  Total score 28        07/24/2023    4:01 PM 07/20/2022    3:38 PM  6CIT Screen  What Year? 0 points 0 points  What month? 0 points 0 points  What time? 0 points 0 points  Count back from 20 0 points 0 points  Months in reverse 0 points 0 points  Repeat phrase 0 points 0 points  Total Score 0 points 0 points    Immunizations Immunization History  Administered Date(s) Administered   Fluad Quad(high Dose 65+) 08/12/2019, 12/30/2020, 07/26/2021, 07/27/2022   Influenza Split 08/03/2011, 09/13/2012   Influenza Whole 08/12/2008, 08/12/2009, 08/19/2010   Influenza, High Dose Seasonal PF 09/05/2013, 09/07/2016, 09/06/2017, 09/28/2018   Influenza,inj,Quad PF,6+ Mos 08/18/2014, 08/07/2015   PFIZER(Purple Top)SARS-COV-2 Vaccination 11/15/2019, 12/12/2019, 08/26/2020   Pneumococcal Conjugate-13 08/18/2014   Pneumococcal Polysaccharide-23 09/10/2007, 07/07/2020   Td 10/21/2008   Tdap 05/09/2019   Zoster, Live 02/04/2010    TDAP status: Up to date  Flu Vaccine status: Due,  Education has been provided regarding the importance of this vaccine. Advised may receive this vaccine at local pharmacy or Health Dept. Aware to provide a copy of the vaccination record if obtained from local pharmacy or Health Dept. Verbalized acceptance and understanding.  Pneumococcal vaccine status: Up to date  Covid-19 vaccine status: Completed vaccines  Qualifies for Shingles Vaccine? Yes   Zostavax completed No   Shingrix Completed?: No.    Education has been provided regarding the importance of this vaccine. Patient has been advised to call insurance company to determine out of pocket expense if they have not yet received this vaccine. Advised may also receive vaccine at local pharmacy or Health Dept. Verbalized acceptance and understanding.  Screening Tests Health Maintenance  Topic Date Due   Zoster Vaccines- Shingrix (1 of 2) 01/19/1960   INFLUENZA VACCINE  06/15/2023   COVID-19 Vaccine (4 - 2023-24 season) 07/16/2023   OPHTHALMOLOGY EXAM  12/27/2023   HEMOGLOBIN A1C  01/04/2024   Diabetic kidney evaluation - Urine  ACR  01/24/2024   Diabetic kidney evaluation - eGFR measurement  07/03/2024   FOOT EXAM  07/03/2024   Medicare Annual Wellness (AWV)  07/23/2024   DTaP/Tdap/Td (3 - Td or Tdap) 05/08/2029   Pneumonia Vaccine 65+ Years old  Completed   DEXA SCAN  Completed   HPV VACCINES  Aged Out    Health Maintenance  Health Maintenance Due  Topic Date Due   Zoster Vaccines- Shingrix (1 of 2) 01/19/1960   INFLUENZA VACCINE  06/15/2023   COVID-19 Vaccine (4 - 2023-24 season) 07/16/2023    Colorectal cancer screening: No longer required.   Mammogram status: Completed 05/10/2023. Repeat every year  Bone Density status: Completed 08/22/2022. Results reflect: Bone density results: OSTEOPENIA. Repeat every 2-3 years.  Lung Cancer Screening: (Low Dose CT Chest recommended if Age 43-80 years, 20 pack-year currently smoking OR have quit w/in 15years.) does not qualify.   Lung  Cancer Screening Referral: no  Additional Screening:  Hepatitis C Screening: does not qualify.  Vision Screening: Recommended annual ophthalmology exams for early detection of glaucoma and other disorders of the eye. Is the patient up to date with their annual eye exam?  Yes  Who is the provider or what is the name of the office in which the patient attends annual eye exams? [facility], [place]. If pt is not established with a provider, would they like to be referred to a provider to establish care? No .   Dental Screening: Recommended annual dental exams for proper oral hygiene  Diabetic Foot Exam: Diabetic Foot Exam: Completed 07/04/2023  Community Resource Referral / Chronic Care Management: CRR required this visit?  No   CCM required this visit?  No     Plan:     I have personally reviewed and noted the following in the patient's chart:   Medical and social history Use of alcohol, tobacco or illicit drugs  Current medications and supplements including opioid prescriptions. Patient is not currently taking opioid prescriptions. Functional ability and status Nutritional status Physical activity Advanced directives List of other physicians Hospitalizations, surgeries, and ER visits in previous 12 months Vitals Screenings to include cognitive, depression, and falls Referrals and appointments  In addition, I have reviewed and discussed with patient certain preventive protocols, quality metrics, and best practice recommendations. A written personalized care plan for preventive services as well as general preventive health recommendations were provided to patient.     Mickeal Needy, LPN   04/17/8755   After Visit Summary: (Mail) Due to this being a telephonic visit, the after visit summary with patients personalized plan was offered to patient via mail   Nurse Notes: Normal cognitive status assessed by direct observation via telephone conversation by this Nurse Health  Advisor. No abnormalities found.  Patient provided his/her weight during this virtual phone visit.

## 2023-08-30 ENCOUNTER — Other Ambulatory Visit: Payer: Self-pay | Admitting: Family Medicine

## 2023-08-30 DIAGNOSIS — J309 Allergic rhinitis, unspecified: Secondary | ICD-10-CM

## 2023-08-30 DIAGNOSIS — R0981 Nasal congestion: Secondary | ICD-10-CM

## 2023-11-13 DIAGNOSIS — R001 Bradycardia, unspecified: Secondary | ICD-10-CM | POA: Diagnosis not present

## 2023-11-13 DIAGNOSIS — I499 Cardiac arrhythmia, unspecified: Secondary | ICD-10-CM | POA: Diagnosis not present

## 2023-11-13 DIAGNOSIS — J301 Allergic rhinitis due to pollen: Secondary | ICD-10-CM | POA: Diagnosis not present

## 2023-11-13 DIAGNOSIS — M1990 Unspecified osteoarthritis, unspecified site: Secondary | ICD-10-CM | POA: Diagnosis not present

## 2023-11-13 DIAGNOSIS — E669 Obesity, unspecified: Secondary | ICD-10-CM | POA: Diagnosis not present

## 2023-11-13 DIAGNOSIS — E785 Hyperlipidemia, unspecified: Secondary | ICD-10-CM | POA: Diagnosis not present

## 2023-11-13 DIAGNOSIS — Z8249 Family history of ischemic heart disease and other diseases of the circulatory system: Secondary | ICD-10-CM | POA: Diagnosis not present

## 2023-11-13 DIAGNOSIS — K219 Gastro-esophageal reflux disease without esophagitis: Secondary | ICD-10-CM | POA: Diagnosis not present

## 2023-11-13 DIAGNOSIS — I10 Essential (primary) hypertension: Secondary | ICD-10-CM | POA: Diagnosis not present

## 2024-01-16 ENCOUNTER — Telehealth: Payer: Self-pay

## 2024-01-16 DIAGNOSIS — E559 Vitamin D deficiency, unspecified: Secondary | ICD-10-CM

## 2024-01-16 DIAGNOSIS — E119 Type 2 diabetes mellitus without complications: Secondary | ICD-10-CM

## 2024-01-16 DIAGNOSIS — E7800 Pure hypercholesterolemia, unspecified: Secondary | ICD-10-CM

## 2024-01-16 DIAGNOSIS — E538 Deficiency of other specified B group vitamins: Secondary | ICD-10-CM

## 2024-01-16 NOTE — Telephone Encounter (Signed)
 Copied from CRM [id number]. Topic: Clinical - Request for Lab/Test Order >> Jan 16, 2024  4:31 PM Irine Seal wrote: Reason for CRM:  Lab Work for [employer]  The patient is scheduled for a 47-month follow-up on 01/23/2024. She would like to have lab work done, but there are no orders on file. She would like to know if the lab work can be scheduled and if she should fast prior to the appointment. Please respond via MyChart.

## 2024-01-17 NOTE — Telephone Encounter (Signed)
Ok orders done 

## 2024-01-18 NOTE — Telephone Encounter (Signed)
Sent message via My chart as requested

## 2024-01-23 ENCOUNTER — Ambulatory Visit: Admitting: Internal Medicine

## 2024-01-30 ENCOUNTER — Other Ambulatory Visit: Payer: Self-pay | Admitting: Internal Medicine

## 2024-01-30 ENCOUNTER — Other Ambulatory Visit: Payer: Self-pay

## 2024-02-05 ENCOUNTER — Ambulatory Visit: Admitting: Internal Medicine

## 2024-02-05 ENCOUNTER — Encounter: Payer: Self-pay | Admitting: Internal Medicine

## 2024-02-05 VITALS — BP 126/78 | HR 70 | Temp 98.7°F | Ht 65.0 in | Wt 211.0 lb

## 2024-02-05 DIAGNOSIS — E7800 Pure hypercholesterolemia, unspecified: Secondary | ICD-10-CM | POA: Diagnosis not present

## 2024-02-05 DIAGNOSIS — K219 Gastro-esophageal reflux disease without esophagitis: Secondary | ICD-10-CM | POA: Diagnosis not present

## 2024-02-05 DIAGNOSIS — E538 Deficiency of other specified B group vitamins: Secondary | ICD-10-CM | POA: Diagnosis not present

## 2024-02-05 DIAGNOSIS — E559 Vitamin D deficiency, unspecified: Secondary | ICD-10-CM | POA: Diagnosis not present

## 2024-02-05 DIAGNOSIS — I10 Essential (primary) hypertension: Secondary | ICD-10-CM

## 2024-02-05 DIAGNOSIS — H6121 Impacted cerumen, right ear: Secondary | ICD-10-CM | POA: Diagnosis not present

## 2024-02-05 DIAGNOSIS — E119 Type 2 diabetes mellitus without complications: Secondary | ICD-10-CM

## 2024-02-05 DIAGNOSIS — Z0000 Encounter for general adult medical examination without abnormal findings: Secondary | ICD-10-CM

## 2024-02-05 DIAGNOSIS — M1711 Unilateral primary osteoarthritis, right knee: Secondary | ICD-10-CM

## 2024-02-05 DIAGNOSIS — Z0001 Encounter for general adult medical examination with abnormal findings: Secondary | ICD-10-CM

## 2024-02-05 DIAGNOSIS — H6120 Impacted cerumen, unspecified ear: Secondary | ICD-10-CM | POA: Insufficient documentation

## 2024-02-05 LAB — BASIC METABOLIC PANEL
BUN: 11 mg/dL (ref 6–23)
CO2: 28 meq/L (ref 19–32)
Calcium: 10.2 mg/dL (ref 8.4–10.5)
Chloride: 104 meq/L (ref 96–112)
Creatinine, Ser: 0.76 mg/dL (ref 0.40–1.20)
GFR: 72.14 mL/min (ref >60.00)
Glucose, Bld: 111 mg/dL — ABNORMAL HIGH (ref 70–99)
Potassium: 3.8 meq/L (ref 3.5–5.1)
Sodium: 139 meq/L (ref 135–145)

## 2024-02-05 LAB — CBC WITH DIFFERENTIAL/PLATELET
Basophils Absolute: 0 10*3/uL (ref 0.0–0.1)
Basophils Relative: 0.8 % (ref 0.0–3.0)
Eosinophils Absolute: 0.1 10*3/uL (ref 0.0–0.7)
Eosinophils Relative: 2.2 % (ref 0.0–5.0)
HCT: 44 % (ref 36.0–46.0)
Hemoglobin: 14.8 g/dL (ref 12.0–15.0)
Lymphocytes Relative: 47.5 % — ABNORMAL HIGH (ref 12.0–46.0)
Lymphs Abs: 1.6 10*3/uL (ref 0.7–4.0)
MCHC: 33.6 g/dL (ref 30.0–36.0)
MCV: 94.1 fl (ref 78.0–100.0)
Monocytes Absolute: 0.2 10*3/uL (ref 0.1–1.0)
Monocytes Relative: 4.9 % (ref 3.0–12.0)
Neutro Abs: 1.5 10*3/uL (ref 1.4–7.7)
Neutrophils Relative %: 44.6 % (ref 43.0–77.0)
Platelets: 214 10*3/uL (ref 150.0–400.0)
RBC: 4.68 Mil/uL (ref 3.87–5.11)
RDW: 12.7 % (ref 11.5–15.5)
WBC: 3.3 10*3/uL — ABNORMAL LOW (ref 4.0–10.5)

## 2024-02-05 LAB — URINALYSIS, ROUTINE W REFLEX MICROSCOPIC
Bilirubin Urine: NEGATIVE
Hgb urine dipstick: NEGATIVE
Ketones, ur: NEGATIVE
Leukocytes,Ua: NEGATIVE
Nitrite: NEGATIVE
RBC / HPF: NONE SEEN (ref 0–?)
Specific Gravity, Urine: 1.01 (ref 1.000–1.030)
Total Protein, Urine: NEGATIVE
Urine Glucose: NEGATIVE
Urobilinogen, UA: 0.2 (ref 0.0–1.0)
pH: 7.5 (ref 5.0–8.0)

## 2024-02-05 LAB — VITAMIN D 25 HYDROXY (VIT D DEFICIENCY, FRACTURES): VITD: 46.31 ng/mL (ref 30.00–100.00)

## 2024-02-05 LAB — VITAMIN B12: Vitamin B-12: 1235 pg/mL — ABNORMAL HIGH (ref 211–911)

## 2024-02-05 LAB — LIPID PANEL
Cholesterol: 225 mg/dL — ABNORMAL HIGH (ref 0–200)
HDL: 48.3 mg/dL (ref >39.00)
LDL Cholesterol: 143 mg/dL — ABNORMAL HIGH (ref 0–99)
NonHDL: 176.83
Total CHOL/HDL Ratio: 5
Triglycerides: 167 mg/dL — ABNORMAL HIGH (ref 0.0–149.0)
VLDL: 33.4 mg/dL (ref 0.0–40.0)

## 2024-02-05 LAB — HEPATIC FUNCTION PANEL
ALT: 12 U/L (ref 0–35)
AST: 15 U/L (ref 0–37)
Albumin: 4.6 g/dL (ref 3.5–5.2)
Alkaline Phosphatase: 57 U/L (ref 39–117)
Bilirubin, Direct: 0.1 mg/dL (ref 0.0–0.3)
Total Bilirubin: 0.9 mg/dL (ref 0.2–1.2)
Total Protein: 7.6 g/dL (ref 6.0–8.3)

## 2024-02-05 LAB — MICROALBUMIN / CREATININE URINE RATIO
Creatinine,U: 42.6 mg/dL
Microalb Creat Ratio: 18.1 mg/g (ref 0.0–30.0)
Microalb, Ur: 0.8 mg/dL (ref 0.0–1.9)

## 2024-02-05 LAB — HEMOGLOBIN A1C: Hgb A1c MFr Bld: 6.2 % (ref 4.6–6.5)

## 2024-02-05 LAB — TSH: TSH: 0.94 u[IU]/mL (ref 0.35–5.50)

## 2024-02-05 MED ORDER — PANTOPRAZOLE SODIUM 40 MG PO TBEC: 40.0000 mg | DELAYED_RELEASE_TABLET | Freq: Every day | ORAL | 3 refills | Status: AC

## 2024-02-05 NOTE — Assessment & Plan Note (Signed)
 Worsening recently, for restart volt gel prn, and asked pt to f/u with sports medicine

## 2024-02-05 NOTE — Progress Notes (Signed)
 Patient ID: Kelly Bell, female   DOB: 03-16-1941, 83 y.o.   MRN: [id number]         Chief Complaint:: wellness exam and Medical Management of Chronic Issues (6 month follow up has been having ear ache in both ears , need handicap form ) , gerd, right cerumen impaction, knee arthritis       HPI:  Kelly Bell is a 83 y.o. female here for wellness exam; for eye exam sched'd apr 2025 yearly, declines flu shot, for shingrix at pharmacy, o/w up to date                        Also with statin myopathy hx.  Pt denies chest pain, increased sob or doe, wheezing, orthopnea, PND, increased LE swelling, palpitations, dizziness or syncope.  Pt has had mild worsening reflux, but no other abd pain, dysphagia, n/v, bowel change or blood.   Pt denies polydipsia, polyuria, or new focal neuro s/s.    Pt denies fever, wt loss, night sweats, loss of appetite, or other constitutional symptoms  has mild reduced hearing right ear.  Also has bilateral knee pain arthritic worsening again mild in the past month, right > left.     Wt Readings from Last 3 Encounters:  02/05/24 211 lb (95.7 kg)  07/24/23 207 lb (93.9 kg)  07/04/23 207 lb (93.9 kg)   BP Readings from Last 3 Encounters:  02/05/24 126/78  07/04/23 130/82  03/22/23 138/72   Immunization History  Administered Date(s) Administered   Fluad Quad(high Dose 65+) 08/12/2019, 12/30/2020, 07/26/2021, 07/27/2022   Influenza Split 08/03/2011, 09/13/2012   Influenza Whole 08/12/2008, 08/12/2009, 08/19/2010   Influenza, High Dose Seasonal PF 09/05/2013, 09/07/2016, 09/06/2017, 09/28/2018   Influenza,inj,Quad PF,6+ Mos 08/18/2014, 08/07/2015   PFIZER(Purple Top)SARS-COV-2 Vaccination 11/15/2019, 12/12/2019, 08/26/2020   Pneumococcal Conjugate-13 08/18/2014   Pneumococcal Polysaccharide-23 09/10/2007, 07/07/2020   Td 10/21/2008   Tdap 05/09/2019   Zoster, Live 02/04/2010   Health Maintenance Due  Topic Date Due   Zoster Vaccines- Shingrix (1 of 2) 01/19/1959    OPHTHALMOLOGY EXAM  12/27/2023   HEMOGLOBIN A1C  01/04/2024   Diabetic kidney evaluation - Urine ACR  01/24/2024      Past Medical History:  Diagnosis Date   Allergy    Arthritis    knees   Breast cancer (HCC)    Right   HTN (hypertension)    Hyperlipidemia    Hypertension    Irritable bladder    Knee pain    Bilateral   Personal history of radiation therapy    SVD (spontaneous vaginal delivery)    x 2   Type II or unspecified type diabetes mellitus without mention of complication, not stated as uncontrolled    Past Surgical History:  Procedure Laterality Date   ABDOMINAL HYSTERECTOMY  1975   BREAST LUMPECTOMY Right 1987   COLONOSCOPY  05/10/05, 2011   hx polyps   MASTECTOMY Left 2016   MASTECTOMY PARTIAL / LUMPECTOMY W/ AXILLARY LYMPHADENECTOMY  07/1986   MASTECTOMY W/ SENTINEL NODE BIOPSY Left 10/28/2015   Procedure: LEFT MASTECTOMY WITH SENTINEL LYMPH NODE BIOPSY;  Surgeon: Almond Lint, MD;  Location: [facility];  Service: General;  Laterality: Left;   SHOULDER ARTHROSCOPY  2000    reports that she has never smoked. She has never used smokeless tobacco. She reports current alcohol use. She reports that she does not use drugs. family history includes Breast cancer in her sister; Breast  cancer (age of onset: 3) in her sister; Cancer in her brother and cousin; Corneal Dystrophy in her son; Diabetes (age of onset: 90) in her father and mother; Esophageal cancer in her maternal grandfather; Healthy in her daughter; Heart disease in her father; Heart failure in her father; Pancreatic cancer in her mother; Rectal cancer in her maternal grandfather; Stomach cancer in her maternal grandfather. Allergies  Allergen Reactions   Hydrocodone-Acetaminophen Other (See Comments)    Mental confusion   Metformin And Related Diarrhea   Pravastatin Other (See Comments)    Severe muscle cramps.   Lisinopril     cough   Current Outpatient Medications on File Prior to Visit   Medication Sig Dispense Refill   ACCU-CHEK AVIVA PLUS test strip USE TO CHECK BLOOD SUGAR ONCE DAILY. Dx: E11.9 100 each 0   aspirin 325 MG tablet Take 325 mg by mouth daily.     azelastine (ASTELIN) 0.1 % nasal spray Place 2 sprays into both nostrils 2 (two) times daily. Use in each nostril as directed 30 mL 1   Blood Glucose Monitoring Suppl (ACCU-CHEK AVIVA PLUS) w/Device KIT USE AS DIRECTED TO CHECK GLUCOSE 1 kit 0   Calcium Carbonate-Vitamin D 600-400 MG-UNIT tablet Take 1 tablet by mouth 2 (two) times daily.     ezetimibe (ZETIA) 10 MG tablet TAKE 1 TABLET BY MOUTH EVERY OTHER DAY 45 tablet 0   levocetirizine (XYZAL) 5 MG tablet Take 1 tablet (5 mg total) by mouth every evening. 30 tablet 1   losartan-hydrochlorothiazide (HYZAAR) 50-12.5 MG tablet Take 1 tablet by mouth once daily 90 tablet 3   metoprolol succinate (TOPROL-XL) 25 MG 24 hr tablet Take 1 tablet by mouth once daily 90 tablet 3   nabumetone (RELAFEN) 500 MG tablet TAKE 1 TABLET BY MOUTH TWICE DAILY AS NEEDED AFTER A MEAL FOR ARTHRITIS 60 tablet 0   oxybutynin (DITROPAN) 5 MG tablet Take 1 tablet by mouth 4 times daily 360 tablet 3   No current facility-administered medications on file prior to visit.        ROS:  All others reviewed and negative.  Objective        PE:  BP 126/78 (BP Location: Left Arm, Patient Position: Sitting, Cuff Size: Normal)   Pulse 70   Temp 98.7 F (37.1 C) (Oral)   Ht 5\' 5"  (1.651 m)   Wt 211 lb (95.7 kg)   SpO2 96%   BMI 35.11 kg/m                 Constitutional: Pt appears in NAD               HENT: Head: NCAT.                Right Ear: External ear normal.  Mild cerumen impaction noted on right               Left Ear: External ear normal.                Eyes: . Pupils are equal, round, and reactive to light. Conjunctivae and EOM are normal               Nose: without d/c or deformity               Neck: Neck supple. Gross normal ROM               Cardiovascular: Normal rate and  regular rhythm.  Pulmonary/Chest: Effort normal and breath sounds without rales or wheezing.                Abd:  Soft, NT, ND, + BS, no organomegaly               Neurological: Pt is alert. At baseline orientation, motor grossly intact               Skin: Skin is warm. No rashes, no other new lesions, LE edema - none               Psychiatric: Pt behavior is normal without agitation   Micro: none  Cardiac tracings I have personally interpreted today:  none  Pertinent Radiological findings (summarize): none   Lab Results  Component Value Date   WBC 4.4 01/24/2023   HGB 14.3 01/24/2023   HCT 42.5 01/24/2023   PLT 207.0 01/24/2023   GLUCOSE 124 (H) 07/04/2023   CHOL 220 (H) 07/04/2023   TRIG 188.0 (H) 07/04/2023   HDL 41.50 07/04/2023   LDLDIRECT 111.5 01/01/2007   LDLCALC 141 (H) 07/04/2023   ALT 13 01/24/2023   AST 16 01/24/2023   NA 140 07/04/2023   K 4.4 07/04/2023   CL 105 07/04/2023   CREATININE 0.71 07/04/2023   BUN 12 07/04/2023   CO2 29 07/04/2023   TSH 1.67 01/24/2023   HGBA1C 6.2 07/04/2023   MICROALBUR 1.0 01/24/2023   Assessment/Plan:  Kelly Bell is a 83 y.o. Black or African American [2] female with  has a past medical history of Allergy, Arthritis, Breast cancer (HCC), HTN (hypertension), Hyperlipidemia, Hypertension, Irritable bladder, Knee pain, Personal history of radiation therapy, SVD (spontaneous vaginal delivery), and Type II or unspecified type diabetes mellitus without mention of complication, not stated as uncontrolled.  Encounter for well adult exam with abnormal findings Age and sex appropriate education and counseling updated with regular exercise and diet Referrals for preventative services - for eye exam apr 2025 per pt Immunizations addressed - declines flu shot, for shingrix at pharmacy Smoking counseling  - none needed Evidence for depression or other mood disorder - none significant Most recent labs reviewed. I have  personally reviewed and have noted: 1) the patient's medical and social history 2) The patient's current medications and supplements 3) The patient's height, weight, and BMI have been recorded in the chart   Vitamin D deficiency Last vitamin D Lab Results  Component Value Date   VD25OH 42.89 07/04/2023   Stable, cont oral replacement   Hyperlipidemia Lab Results  Component Value Date   LDLCALC 141 (H) 07/04/2023   Uncontrolled, to restart zetia 10 every day, for lower chol diet, and f/u lipids today  GERD (gastroesophageal reflux disease) Mild to mod worsening, for protonix 40 every day,,  to f/u any worsening symptoms or concerns   Essential hypertension BP Readings from Last 3 Encounters:  02/05/24 126/78  07/04/23 130/82  03/22/23 138/72   Stable, pt to continue medical treatment hyzaar 50 12.5 every day, toprol xl 25 qd   Diabetes type 2, controlled (HCC) Lab Results  Component Value Date   HGBA1C 6.2 07/04/2023   Stable, pt to continue current wt and diet control, for f/u lab today   Cerumen impaction Mild to mod, for OTC Debrox tx, to f/u any worsening symptoms or concerns   B12 deficiency Lab Results  Component Value Date   VITAMINB12 >1500 (H) 01/24/2023   Stable, cont oral replacement - b12 1000 mcg qd  Arthritis of right knee Worsening recently, for restart volt gel prn, and asked pt to f/u with sports medicine  Followup: Return in about 6 months (around 08/07/2024).  Oliver Barre, MD 02/05/2024 12:06 PM [facility]

## 2024-02-05 NOTE — Assessment & Plan Note (Signed)
 Lab Results  Component Value Date   HGBA1C 6.2 07/04/2023   Stable, pt to continue current wt and diet control, for f/u lab today

## 2024-02-05 NOTE — Patient Instructions (Addendum)
 Please have your Shingrix (shingles) shots done at your local pharmacy.  Please take all new medication as prescribed - the protonix 40 mg per day for acid reflux  Ok to use DEBROX (Otc) for the right ear wax  Please continue all other medications as before, and refills have been done if requested.  Please have the pharmacy call with any other refills you may need.  Please continue your efforts at being more active, low cholesterol diet, and weight control.  You are otherwise up to date with prevention measures today.  Please keep your appointments with your specialists as you may have planned  Please go to the LAB at the blood drawing area for the tests to be done  You will be contacted by phone if any changes need to be made immediately.  Otherwise, you will receive a letter about your results with an explanation, but please check with MyChart first.  Please make an Appointment to return in 6 months, or sooner if needed

## 2024-02-05 NOTE — Progress Notes (Signed)
 The test results show that your current treatment is OK, as the tests are stable.  Please continue the same plan.  There is no other need for change of treatment or further evaluation based on these results, at this time.  thanks

## 2024-02-05 NOTE — Assessment & Plan Note (Signed)
 Mild to mod worsening, for protonix 40 every day,,  to f/u any worsening symptoms or concerns

## 2024-02-05 NOTE — Assessment & Plan Note (Signed)
 Lab Results  Component Value Date   LDLCALC 141 (H) 07/04/2023   Uncontrolled, to restart zetia 10 every day, for lower chol diet, and f/u lipids today

## 2024-02-05 NOTE — Assessment & Plan Note (Signed)
 Mild to mod, for OTC Debrox tx, to f/u any worsening symptoms or concerns

## 2024-02-05 NOTE — Assessment & Plan Note (Signed)
Last vitamin D Lab Results  Component Value Date   VD25OH 42.89 07/04/2023   Stable, cont oral replacement

## 2024-02-05 NOTE — Assessment & Plan Note (Signed)
 Age and sex appropriate education and counseling updated with regular exercise and diet Referrals for preventative services - for eye exam apr 2025 per pt Immunizations addressed - declines flu shot, for shingrix at pharmacy Smoking counseling  - none needed Evidence for depression or other mood disorder - none significant Most recent labs reviewed. I have personally reviewed and have noted: 1) the patient's medical and social history 2) The patient's current medications and supplements 3) The patient's height, weight, and BMI have been recorded in the chart

## 2024-02-05 NOTE — Assessment & Plan Note (Signed)
 BP Readings from Last 3 Encounters:  02/05/24 126/78  07/04/23 130/82  03/22/23 138/72   Stable, pt to continue medical treatment hyzaar 50 12.5 every day, toprol xl 25 qd

## 2024-02-05 NOTE — Assessment & Plan Note (Signed)
Lab Results  Component Value Date   VITAMINB12 >1500 (H) 01/24/2023   Stable, cont oral replacement - b12 1000 mcg qd

## 2024-02-16 ENCOUNTER — Other Ambulatory Visit: Payer: Self-pay

## 2024-02-16 ENCOUNTER — Other Ambulatory Visit: Payer: Self-pay | Admitting: Internal Medicine

## 2024-03-12 ENCOUNTER — Other Ambulatory Visit: Payer: Self-pay | Admitting: Internal Medicine

## 2024-03-13 ENCOUNTER — Ambulatory Visit: Payer: Self-pay | Admitting: Internal Medicine

## 2024-03-13 NOTE — Telephone Encounter (Signed)
 Copied from CRM [id number]. Topic: Clinical - Medication Question >> Mar 13, 2024 11:53 AM Jenice Mitts wrote: Reason for CRM: Patient is calling because she would like in detail why her medication was refused fluticasone  (FLONASE )

## 2024-03-14 ENCOUNTER — Other Ambulatory Visit: Payer: Self-pay | Admitting: Internal Medicine

## 2024-03-14 MED ORDER — FLUTICASONE PROPIONATE 50 MCG/ACT NA SUSP: 2.0000 | Freq: Every day | NASAL | 6 refills | Status: AC

## 2024-03-14 MED ORDER — FLUTICASONE PROPIONATE 50 MCG/ACT NA SUSP: 2.0000 | Freq: Every day | NASAL | 6 refills | Status: DC

## 2024-03-14 NOTE — Telephone Encounter (Signed)
 Ok done erx

## 2024-03-14 NOTE — Telephone Encounter (Signed)
 Flonase  done erx

## 2024-04-12 ENCOUNTER — Other Ambulatory Visit: Payer: Self-pay | Admitting: Internal Medicine

## 2024-05-01 ENCOUNTER — Other Ambulatory Visit: Payer: Self-pay

## 2024-05-01 ENCOUNTER — Other Ambulatory Visit: Payer: Self-pay | Admitting: Internal Medicine

## 2024-07-13 ENCOUNTER — Other Ambulatory Visit: Payer: Self-pay | Admitting: Internal Medicine

## 2024-07-24 ENCOUNTER — Ambulatory Visit: Payer: Medicare PPO

## 2024-08-02 ENCOUNTER — Other Ambulatory Visit: Payer: Self-pay | Admitting: Internal Medicine

## 2024-08-05 ENCOUNTER — Other Ambulatory Visit

## 2024-08-05 ENCOUNTER — Ambulatory Visit: Admitting: Internal Medicine

## 2024-08-07 ENCOUNTER — Ambulatory Visit (INDEPENDENT_AMBULATORY_CARE_PROVIDER_SITE_OTHER): Admitting: Internal Medicine

## 2024-08-07 ENCOUNTER — Ambulatory Visit: Payer: Self-pay | Admitting: Internal Medicine

## 2024-08-07 ENCOUNTER — Other Ambulatory Visit

## 2024-08-07 VITALS — BP 142/90 | HR 55 | Temp 97.6°F | Wt 207.5 lb

## 2024-08-07 DIAGNOSIS — I10 Essential (primary) hypertension: Secondary | ICD-10-CM | POA: Diagnosis not present

## 2024-08-07 DIAGNOSIS — E559 Vitamin D deficiency, unspecified: Secondary | ICD-10-CM

## 2024-08-07 DIAGNOSIS — E538 Deficiency of other specified B group vitamins: Secondary | ICD-10-CM | POA: Diagnosis not present

## 2024-08-07 DIAGNOSIS — Z23 Encounter for immunization: Secondary | ICD-10-CM | POA: Diagnosis not present

## 2024-08-07 DIAGNOSIS — E7800 Pure hypercholesterolemia, unspecified: Secondary | ICD-10-CM

## 2024-08-07 DIAGNOSIS — E1165 Type 2 diabetes mellitus with hyperglycemia: Secondary | ICD-10-CM | POA: Diagnosis not present

## 2024-08-07 DIAGNOSIS — H6992 Unspecified Eustachian tube disorder, left ear: Secondary | ICD-10-CM

## 2024-08-07 DIAGNOSIS — K5900 Constipation, unspecified: Secondary | ICD-10-CM | POA: Diagnosis not present

## 2024-08-07 LAB — LIPID PANEL
Cholesterol: 206 mg/dL — ABNORMAL HIGH (ref 0–200)
HDL: 48.4 mg/dL (ref >39.00)
LDL Cholesterol: 131 mg/dL — ABNORMAL HIGH (ref 0–99)
NonHDL: 157.6
Total CHOL/HDL Ratio: 4
Triglycerides: 132 mg/dL (ref 0.0–149.0)
VLDL: 26.4 mg/dL (ref 0.0–40.0)

## 2024-08-07 LAB — BASIC METABOLIC PANEL WITH GFR
BUN: 10 mg/dL (ref 6–23)
CO2: 29 meq/L (ref 19–32)
Calcium: 10.1 mg/dL (ref 8.4–10.5)
Chloride: 106 meq/L (ref 96–112)
Creatinine, Ser: 0.76 mg/dL (ref 0.40–1.20)
GFR: 71.89 mL/min (ref >60.00)
Glucose, Bld: 125 mg/dL — ABNORMAL HIGH (ref 70–99)
Potassium: 4.2 meq/L (ref 3.5–5.1)
Sodium: 140 meq/L (ref 135–145)

## 2024-08-07 LAB — HEMOGLOBIN A1C: Hgb A1c MFr Bld: 6.5 % (ref 4.6–6.5)

## 2024-08-07 LAB — HEPATIC FUNCTION PANEL
ALT: 21 U/L (ref 0–35)
AST: 21 U/L (ref 0–37)
Albumin: 4.3 g/dL (ref 3.5–5.2)
Alkaline Phosphatase: 63 U/L (ref 39–117)
Bilirubin, Direct: 0.1 mg/dL (ref 0.0–0.3)
Total Bilirubin: 0.6 mg/dL (ref 0.2–1.2)
Total Protein: 6.9 g/dL (ref 6.0–8.3)

## 2024-08-07 NOTE — Progress Notes (Unsigned)
 Patient ID: Kelly Bell, female   DOB: [birth year], 83 y.o.   MRN: [id number]        Chief Complaint: follow up left eustachian tube dysfxn, constipation, low vit d, hld, htn, b12 deficiency, dm       HPI:  Kelly Bell is a 83 y.o. female here overall doing ok, but has 1 wk worsening left ear fullness and clogged up feeling, hard to pop' wth valsalva not working.  No fever or worsening pain.Does have several wks ongoing nasal allergy symptoms with clearish congestion, itch and sneezing, without fever, pain, ST, cough, swelling or wheezing.  BP has been controlled at home.   Denies worsening reflux, abd pain, dysphagia, n/v, or blood but has mild intermittent but near daily constipation.  Pt does not want statin, but would consider repatha.  Pt denies chest pain, increased sob or doe, wheezing, orthopnea, PND, increased LE swelling, palpitations,   Pt denies polydipsia, polyuria, or new focal neuro s/s.   Due for flu shot Wt Readings from Last 3 Encounters:  08/07/24 207 lb 8 oz (94.1 kg)  02/05/24 211 lb (95.7 kg)  07/24/23 207 lb (93.9 kg)   BP Readings from Last 3 Encounters:  08/07/24 (!) 142/90  02/05/24 126/78  07/04/23 130/82         Past Medical History:  Diagnosis Date   Allergy    Arthritis    knees   Breast cancer (HCC)    Right   HTN (hypertension)    Hyperlipidemia    Hypertension    Irritable bladder    Knee pain    Bilateral   Personal history of radiation therapy    SVD (spontaneous vaginal delivery)    x 2   Type II or unspecified type diabetes mellitus without mention of complication, not stated as uncontrolled    Past Surgical History:  Procedure Laterality Date   ABDOMINAL HYSTERECTOMY  1975   BREAST LUMPECTOMY Right 1987   COLONOSCOPY  05/10/05, 2011   hx polyps   MASTECTOMY Left 2016   MASTECTOMY PARTIAL / LUMPECTOMY W/ AXILLARY LYMPHADENECTOMY  07/1986   MASTECTOMY W/ SENTINEL NODE BIOPSY Left 10/28/2015   Procedure: LEFT MASTECTOMY WITH SENTINEL LYMPH  NODE BIOPSY;  Surgeon: Jina Nephew, MD;  Location: [facility];  Service: General;  Laterality: Left;   SHOULDER ARTHROSCOPY  2000    reports that she has never smoked. She has never used smokeless tobacco. She reports current alcohol use. She reports that she does not use drugs. family history includes Breast cancer in her sister; Breast cancer (age of onset: 57) in her sister; Cancer in her brother and cousin; Corneal Dystrophy in her son; Diabetes (age of onset: 24) in her father and mother; Esophageal cancer in her maternal grandfather; Healthy in her daughter; Heart disease in her father; Heart failure in her father; Pancreatic cancer in her mother; Rectal cancer in her maternal grandfather; Stomach cancer in her maternal grandfather. Allergies  Allergen Reactions   Hydrocodone -Acetaminophen  Other (See Comments)    Mental confusion   Metformin  And Related Diarrhea   Pravastatin  Other (See Comments)    Severe muscle cramps.   Lisinopril      cough   Current Outpatient Medications on File Prior to Visit  Medication Sig Dispense Refill   aspirin 325 MG tablet Take 325 mg by mouth daily.     azelastine  (ASTELIN ) 0.1 % nasal spray Place 2 sprays into both nostrils 2 (two) times daily. Use in each  nostril as directed 30 mL 1   Blood Glucose Monitoring Suppl (ACCU-CHEK AVIVA PLUS) w/Device KIT USE AS DIRECTED TO CHECK GLUCOSE 1 kit 0   Calcium Carbonate-Vitamin D  600-400 MG-UNIT tablet Take 1 tablet by mouth 2 (two) times daily.     ezetimibe  (ZETIA ) 10 MG tablet TAKE 1 TABLET BY MOUTH EVERY OTHER DAY 45 tablet 0   fluticasone  (FLONASE ) 50 MCG/ACT nasal spray Place 2 sprays into both nostrils daily. 16 g 6   levocetirizine (XYZAL ) 5 MG tablet Take 1 tablet (5 mg total) by mouth every evening. 30 tablet 1   losartan -hydrochlorothiazide  (HYZAAR) 50-12.5 MG tablet Take 1 tablet by mouth once daily 90 tablet 3   metoprolol  succinate (TOPROL -XL) 25 MG 24 hr tablet Take 1 tablet by  mouth once daily 90 tablet 0   nabumetone  (RELAFEN ) 500 MG tablet TAKE 1 TABLET BY MOUTH TWICE DAILY AS NEEDED AFTER A MEAL FOR ARTHRITIS 60 tablet 0   oxybutynin  (DITROPAN ) 5 MG tablet Take 1 tablet by mouth 4 times daily 360 tablet 0   pantoprazole  (PROTONIX ) 40 MG tablet Take 1 tablet (40 mg total) by mouth daily. 90 tablet 3   ACCU-CHEK AVIVA PLUS test strip USE TO CHECK BLOOD SUGAR ONCE DAILY. Dx: E11.9 100 each 0   No current facility-administered medications on file prior to visit.        ROS:  All others reviewed and negative.  Objective        PE:  BP (!) 142/90   Pulse (!) 55   Temp 97.6 F (36.4 C) (Temporal)   Wt 207 lb 8 oz (94.1 kg)   SpO2 95%   BMI 34.53 kg/m                 Constitutional: Pt appears in NAD               HENT: Head: NCAT.                Right Ear: External ear normal.                 Left Ear: External ear normal. Left TM with mild erythema and effusion.  Max sinus areas mild tender.  Pharynx with mild erythema, no exudate               Eyes: . Pupils are equal, round, and reactive to light. Conjunctivae and EOM are normal               Nose: without d/c or deformity               Neck: Neck supple. Gross normal ROM               Cardiovascular: Normal rate and regular rhythm.                 Pulmonary/Chest: Effort normal and breath sounds without rales or wheezing.                Abd:  Soft, NT, ND, + BS, no organomegaly               Neurological: Pt is alert. At baseline orientation, motor grossly intact               Skin: Skin is warm. No rashes, no other new lesions, LE edema - none               Psychiatric: Pt behavior is normal without agitation  Micro: none  Cardiac tracings I have personally interpreted today:  none  Pertinent Radiological findings (summarize): none   Lab Results  Component Value Date   WBC 3.3 (L) 02/05/2024   HGB 14.8 02/05/2024   HCT 44.0 02/05/2024   PLT 214.0 02/05/2024   GLUCOSE 125 (H) 08/07/2024    CHOL 206 (H) 08/07/2024   TRIG 132.0 08/07/2024   HDL 48.40 08/07/2024   LDLDIRECT 111.5 01/01/2007   LDLCALC 131 (H) 08/07/2024   ALT 21 08/07/2024   AST 21 08/07/2024   NA 140 08/07/2024   K 4.2 08/07/2024   CL 106 08/07/2024   CREATININE 0.76 08/07/2024   BUN 10 08/07/2024   CO2 29 08/07/2024   TSH 0.94 02/05/2024   HGBA1C 6.5 08/07/2024   MICROALBUR 0.8 02/05/2024   Assessment/Plan:  AYLIANA CASCIANO is a 83 y.o. Black or African American [2] female with  has a past medical history of Allergy, Arthritis, Breast cancer (HCC), HTN (hypertension), Hyperlipidemia, Hypertension, Irritable bladder, Knee pain, Personal history of radiation therapy, SVD (spontaneous vaginal delivery), and Type II or unspecified type diabetes mellitus without mention of complication, not stated as uncontrolled.  Vitamin D  deficiency Last vitamin D  Lab Results  Component Value Date   VD25OH 46.31 02/05/2024   Stable, cont oral replacement   Hyperlipidemia Lab Results  Component Value Date   LDLCALC 131 (H) 08/07/2024   Uncontrolled, pt to continue zetia  10 mg every day, but may be willing for repatha soon per pt   Essential hypertension BP Readings from Last 3 Encounters:  08/07/24 (!) 142/90  02/05/24 126/78  07/04/23 130/82   Uncontrolled here, but pt states controlled at home, declines any change in tx, pt to continue medical treatment topro xl 25 mg every day, hyzaar 50 12.5 mg qd   Diabetes type 2, controlled (HCC) Lab Results  Component Value Date   HGBA1C 6.5 08/07/2024  With hyperglycemia; Stable, pt to continue current medical treatment  - diet, wt control   B12 deficiency Lab Results  Component Value Date   VITAMINB12 1,235 (H) 02/05/2024   Stable, cont oral replacement - b12 1000 mcg qd   Acute dysfunction of left eustachian tube Mild to mod, for mucinex bid prn,  to f/u any worsening symptoms or concerns  Constipation Mild to mod, for miralax 17 gm every day asd,   to f/u any worsening symptoms or concerns  Followup: Return in about 6 months (around 02/04/2025).  Lynwood Rush, MD 08/09/2024 6:11 AM [facility]

## 2024-08-07 NOTE — Patient Instructions (Signed)
 You had the flu shot today  Ok to try the Mucinex OTC for the left ear being clogged up  Ok to try the Miralax OTC for constipation  Please continue all other medications as before, and refills have been done if requested.  Please have the pharmacy call with any other refills you may need.  Please continue your efforts at being more active, low cholesterol diet, and weight control.  Please keep your appointments with your specialists as you may have planned  Please go to the LAB at the blood drawing area for the tests to be done  You will be contacted by phone if any changes need to be made immediately.  Otherwise, you will receive a letter about your results with an explanation, but please check with MyChart first.  Please make an Appointment to return in 6 months, or sooner if needed

## 2024-08-07 NOTE — Progress Notes (Signed)
 The test results show that your current treatment is OK, as the tests are stable.  Please continue the same plan.  There is no other need for change of treatment or further evaluation based on these results, at this time.  thanks

## 2024-08-09 ENCOUNTER — Encounter: Payer: Self-pay | Admitting: Internal Medicine

## 2024-08-09 DIAGNOSIS — K5900 Constipation, unspecified: Secondary | ICD-10-CM | POA: Insufficient documentation

## 2024-08-09 DIAGNOSIS — H6992 Unspecified Eustachian tube disorder, left ear: Secondary | ICD-10-CM | POA: Insufficient documentation

## 2024-08-09 NOTE — Assessment & Plan Note (Addendum)
 Lab Results  Component Value Date   LDLCALC 131 (H) 08/07/2024   Uncontrolled, pt to continue zetia  10 mg every day, but may be willing for repatha soon per pt

## 2024-08-09 NOTE — Assessment & Plan Note (Signed)
 BP Readings from Last 3 Encounters:  08/07/24 (!) 142/90  02/05/24 126/78  07/04/23 130/82   Uncontrolled here, but pt states controlled at home, declines any change in tx, pt to continue medical treatment topro xl 25 mg every day, hyzaar 50 12.5 mg qd

## 2024-08-09 NOTE — Assessment & Plan Note (Signed)
 Last vitamin D  Lab Results  Component Value Date   VD25OH 46.31 02/05/2024   Stable, cont oral replacement

## 2024-08-09 NOTE — Assessment & Plan Note (Addendum)
 Lab Results  Component Value Date   HGBA1C 6.5 08/07/2024  With hyperglycemia; Stable, pt to continue current medical treatment  - diet, wt control

## 2024-08-09 NOTE — Assessment & Plan Note (Signed)
 Lab Results  Component Value Date   VITAMINB12 1,235 (H) 02/05/2024   Stable, cont oral replacement - b12 1000 mcg qd

## 2024-08-09 NOTE — Assessment & Plan Note (Signed)
Mild to mod, for mucinex bid prn,  to f/u any worsening symptoms or concerns 

## 2024-08-09 NOTE — Assessment & Plan Note (Signed)
 Mild to mod, for miralax 17 gm every day asd,  to f/u any worsening symptoms or concerns

## 2024-08-22 DIAGNOSIS — Z833 Family history of diabetes mellitus: Secondary | ICD-10-CM | POA: Diagnosis not present

## 2024-08-22 DIAGNOSIS — E669 Obesity, unspecified: Secondary | ICD-10-CM | POA: Diagnosis not present

## 2024-08-22 DIAGNOSIS — E785 Hyperlipidemia, unspecified: Secondary | ICD-10-CM | POA: Diagnosis not present

## 2024-08-22 DIAGNOSIS — J301 Allergic rhinitis due to pollen: Secondary | ICD-10-CM | POA: Diagnosis not present

## 2024-08-22 DIAGNOSIS — M1990 Unspecified osteoarthritis, unspecified site: Secondary | ICD-10-CM | POA: Diagnosis not present

## 2024-08-22 DIAGNOSIS — Z8249 Family history of ischemic heart disease and other diseases of the circulatory system: Secondary | ICD-10-CM | POA: Diagnosis not present

## 2024-08-22 DIAGNOSIS — N182 Chronic kidney disease, stage 2 (mild): Secondary | ICD-10-CM | POA: Diagnosis not present

## 2024-08-22 DIAGNOSIS — K219 Gastro-esophageal reflux disease without esophagitis: Secondary | ICD-10-CM | POA: Diagnosis not present

## 2024-08-22 DIAGNOSIS — I129 Hypertensive chronic kidney disease with stage 1 through stage 4 chronic kidney disease, or unspecified chronic kidney disease: Secondary | ICD-10-CM | POA: Diagnosis not present

## 2024-09-13 ENCOUNTER — Ambulatory Visit

## 2024-10-08 ENCOUNTER — Ambulatory Visit

## 2024-10-08 VITALS — Ht 65.0 in | Wt 207.0 lb

## 2024-10-08 DIAGNOSIS — Z0000 Encounter for general adult medical examination without abnormal findings: Secondary | ICD-10-CM | POA: Diagnosis not present

## 2024-10-08 NOTE — Patient Instructions (Addendum)
 Kelly Bell,  Thank you for taking the time for your Medicare Wellness Visit. I appreciate your continued commitment to your health goals. Please review the care plan we discussed, and feel free to reach out if I can assist you further.  Please note that Annual Wellness Visits do not include a physical exam. Some assessments may be limited, especially if the visit was conducted virtually. If needed, we may recommend an in-person follow-up with your provider.  Ongoing Care Seeing your primary care provider every 3 to 6 months helps us  monitor your health and provide consistent, personalized care.   Referrals If a referral was made during today's visit and you haven't received any updates within two weeks, please contact the referred provider directly to check on the status.  Recommended Screenings:  Health Maintenance  Topic Date Due   Zoster (Shingles) Vaccine (1 of 2) [birth year]19/1960   Yearly kidney health urinalysis for diabetes  02/04/2025   Complete foot exam   02/04/2025   Hemoglobin A1C  02/04/2025   Yearly kidney function blood test for diabetes  08/07/2025   Eye exam for diabetics  08/26/2025   Medicare Annual Wellness Visit  10/08/2025   DTaP/Tdap/Td vaccine (3 - Td or Tdap) 05/08/2029   Pneumococcal Vaccine for age over 71  Completed   Flu Shot  Completed   Osteoporosis screening with Bone Density Scan  Completed   Meningitis B Vaccine  Aged Out   COVID-19 Vaccine  Discontinued       10/08/2024   10:33 AM  Advanced Directives  Does Patient Have a Medical Advance Directive? No  Would patient like information on creating a medical advance directive? No - Patient declined    Vision: Annual vision screenings are recommended for early detection of glaucoma, cataracts, and diabetic retinopathy. These exams can also reveal signs of chronic conditions such as diabetes and high blood pressure.  Dental: Annual dental screenings help detect early signs of oral cancer, gum disease,  and other conditions linked to overall health, including heart disease and diabetes.

## 2024-10-08 NOTE — Progress Notes (Signed)
 Chief Complaint  Patient presents with   Medicare Wellness     Subjective:  Please attest and cosign this visit due to patients primary care provider not being in the office at the time the visit was completed.  (Pt of Dr Lynwood Rush)   Kelly Bell is a 83 y.o. female who presents for a Medicare Annual Wellness Visit.  I connected with  Kelly Bell on 10/08/24 by a audio enabled telemedicine application and verified that I am speaking with the correct person using two identifiers.  Patient Location: Home  Provider Location: Office/Clinic  Persons Participating in Visit: Patient.  I discussed the limitations of evaluation and management by telemedicine. The patient expressed understanding and agreed to proceed.  Vital Signs: Because this visit was a virtual/telehealth visit, some criteria may be missing or patient reported. Any vitals not documented were not able to be obtained and vitals that have been documented are patient reported.  Allergies (verified) Hydrocodone -acetaminophen , Metformin  and related, Pravastatin , and Lisinopril    History: Past Medical History:  Diagnosis Date   Allergy    Arthritis    knees   Breast cancer (HCC)    Right   HTN (hypertension)    Hyperlipidemia    Hypertension    Irritable bladder    Knee pain    Bilateral   Personal history of radiation therapy    SVD (spontaneous vaginal delivery)    x 2   Type II or unspecified type diabetes mellitus without mention of complication, not stated as uncontrolled    Past Surgical History:  Procedure Laterality Date   ABDOMINAL HYSTERECTOMY  1975   BREAST LUMPECTOMY Right 1987   COLONOSCOPY  05/10/05, 2011   hx polyps   MASTECTOMY Left 2016   MASTECTOMY PARTIAL / LUMPECTOMY W/ AXILLARY LYMPHADENECTOMY  07/1986   MASTECTOMY W/ SENTINEL NODE BIOPSY Left 10/28/2015   Procedure: LEFT MASTECTOMY WITH SENTINEL LYMPH NODE BIOPSY;  Surgeon: Jina Nephew, MD;  Location: [facility];   Service: General;  Laterality: Left;   SHOULDER ARTHROSCOPY  2000   Family History  Problem Relation Age of Onset   Diabetes Mother 8       Deceased   Pancreatic cancer Mother        dx. early 72s   Diabetes Father 12       Deceased   Heart failure Father    Heart disease Father        CHF   Breast cancer Sister 4   Cancer Brother        oral cancer - metastasis to lungs; smoker   Corneal Dystrophy Son        bilateral   Healthy Daughter        x1   Breast cancer Sister        dx. 60s   Esophageal cancer Maternal Grandfather    Rectal cancer Maternal Grandfather    Stomach cancer Maternal Grandfather    Cancer Cousin        oral cancer dx. 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 [employer]  Tobacco Use   Smoking status: Never   Smokeless tobacco: Never  Vaping Use   Vaping status: Never Used  Substance and Sexual Activity   Alcohol use: Yes    Comment: occasional wine   Drug use: No   Sexual activity: Not Currently  Birth control/protection: Post-menopausal    Comment: Hysterectomy   Tobacco Counseling Counseling given: Not Answered  SDOH Screenings   Food Insecurity: No Food Insecurity (10/08/2024)  Housing: Unknown (10/08/2024)  Transportation Needs: No Transportation Needs (10/08/2024)  Utilities: Not At Risk (10/08/2024)  Alcohol Screen: Low Risk  (07/20/2022)  Depression (PHQ2-9): Low Risk  (10/08/2024)  Financial Resource Strain: Low Risk  (07/24/2023)  Physical Activity: Inactive (10/08/2024)  Social Connections: Moderately Integrated (10/08/2024)  Stress: No Stress Concern Present (10/08/2024)  Tobacco Use: Low Risk  (10/08/2024)  Health Literacy: Adequate Health Literacy (10/08/2024)   See flowsheets for full screening details  Depression Screen PHQ 2 & 9 Depression Scale- Over the past 2 weeks, how often have you  been bothered by any of the following problems? Little interest or pleasure in doing things: 0 Feeling down, depressed, or hopeless (PHQ Adolescent also includes...irritable): 0 PHQ-2 Total Score: 0 Trouble falling or staying asleep, or sleeping too much: 0 Feeling tired or having little energy: 0 Poor appetite or overeating (PHQ Adolescent also includes...weight loss): 0 Feeling bad about yourself - or that you are a failure or have let yourself or your family down: 0 Trouble concentrating on things, such as reading the newspaper or watching television (PHQ Adolescent also includes...like school work): 0 Moving or speaking so slowly that other people could have noticed. Or the opposite - being so fidgety or restless that you have been moving around a lot more than usual: 0 Thoughts that you would be better off dead, or of hurting yourself in some way: 0 PHQ-9 Total Score: 0 If you checked off any problems, how difficult have these problems made it for you to do your work, take care of things at home, or get along with other people?: Not difficult at all  Depression Treatment Depression Interventions/Treatment : EYV7-0 Score <4 Follow-up Not Indicated     Goals Addressed               This Visit's Progress     Patient Stated (pt-stated)        Patient stated she plans to restart exercising and join the [facility] in 2026       Visit info / Clinical Intake: Medicare Wellness Visit Type:: Subsequent Annual Wellness Visit Persons participating in visit:: patient Medicare Wellness Visit Mode:: Telephone If telephone:: video declined Because this visit was a virtual/telehealth visit:: vitals recorded from last visit If Telephone or Video please confirm:: I connected with the patient using audio enabled telemedicine application and verified that I am speaking with the correct person using two identifiers; I discussed the limitations of evaluation and management by telemedicine; The patient  expressed understanding and agreed to proceed Patient Location:: Home Provider Location:: Office Information given by:: patient Interpreter Needed?: No Pre-visit prep was completed: yes AWV questionnaire completed by patient prior to visit?: no Living arrangements:: with family/others (Son/Grandson) Patient's Overall Health Status Rating: good Typical amount of pain: none Does pain affect daily life?: no Are you currently prescribed opioids?: no  Dietary Habits and Nutritional Risks How many meals a day?: 3 Eats fruit and vegetables daily?: yes Most meals are obtained by: preparing own meals In the last 2 weeks, have you had any of the following?: none Diabetic:: (!) yes Any non-healing wounds?: no How often do you check your BS?: 0 Would you like to be referred to a Nutritionist or for Diabetic Management? : no  Functional Status Activities of Daily Living (to include ambulation/medication): Independent Ambulation: Independent  with device- listed below Home Assistive Devices/Equipment: Eyeglasses; Dentures (specify type); Cane Medication Administration: Independent Home Management: Independent Manage your own finances?: yes Primary transportation is: driving Concerns about vision?: no *vision screening is required for WTM* Concerns about hearing?: no  Fall Screening Falls in the past year?: 0 Number of falls in past year: 0 Was there an injury with Fall?: 0 Fall Risk Category Calculator: 0 Patient Fall Risk Level: Low Fall Risk  Fall Risk Patient at Risk for Falls Due to: No Fall Risks Fall risk Follow up: Falls evaluation completed; Falls prevention discussed  Home and Transportation Safety: All rugs have non-skid backing?: N/A, no rugs All stairs or steps have railings?: yes (outside) Grab bars in the bathtub or shower?: (!) no Have non-skid surface in bathtub or shower?: yes Good home lighting?: yes Regular seat belt use?: yes  Cognitive Assessment Difficulty  concentrating, remembering, or making decisions? : no Will 6CIT or Mini Cog be Completed: yes What year is it?: 0 points What month is it?: 0 points Give patient an address phrase to remember (5 components): [address] [place], [place] About what time is it?: 0 points Count backwards from 20 to 1: 0 points Say the months of the year in reverse: 0 points Repeat the address phrase from earlier: 0 points 6 CIT Score: 0 points  Advance Directives (For Healthcare) Does Patient Have a Medical Advance Directive?: No Would patient like information on creating a medical advance directive?: No - Patient declined  Reviewed/Updated  Reviewed/Updated: Reviewed All (Medical, Surgical, Family, Medications, Allergies, Care Teams, Patient Goals)        Objective:    Today's Vitals   10/08/24 1031  Weight: 207 lb (93.9 kg)  Height: 5' 5 (1.651 m)   Body mass index is 34.45 kg/m.  Current Medications (verified) Outpatient Encounter Medications as of 10/08/2024  Medication Sig   aspirin 325 MG tablet Take 325 mg by mouth daily.   azelastine  (ASTELIN ) 0.1 % nasal spray Place 2 sprays into both nostrils 2 (two) times daily. Use in each nostril as directed   Blood Glucose Monitoring Suppl (ACCU-CHEK AVIVA PLUS) w/Device KIT USE AS DIRECTED TO CHECK GLUCOSE   Calcium Carbonate-Vitamin D  600-400 MG-UNIT tablet Take 1 tablet by mouth 2 (two) times daily.   ezetimibe  (ZETIA ) 10 MG tablet TAKE 1 TABLET BY MOUTH EVERY OTHER DAY   fluticasone  (FLONASE ) 50 MCG/ACT nasal spray Place 2 sprays into both nostrils daily.   levocetirizine (XYZAL ) 5 MG tablet Take 1 tablet (5 mg total) by mouth every evening.   losartan -hydrochlorothiazide  (HYZAAR) 50-12.5 MG tablet Take 1 tablet by mouth once daily   metoprolol  succinate (TOPROL -XL) 25 MG 24 hr tablet Take 1 tablet by mouth once daily   nabumetone  (RELAFEN ) 500 MG tablet TAKE 1 TABLET BY MOUTH TWICE DAILY AS NEEDED AFTER A MEAL FOR ARTHRITIS   oxybutynin   (DITROPAN ) 5 MG tablet Take 1 tablet by mouth 4 times daily   pantoprazole  (PROTONIX ) 40 MG tablet Take 1 tablet (40 mg total) by mouth daily.   No facility-administered encounter medications on file as of 10/08/2024.   Hearing/Vision screen Hearing Screening - Comments:: Denies hearing difficulties   Vision Screening - Comments:: Wears rx glasses - up to date with routine eye exams with [facility] Immunizations and Health Maintenance Health Maintenance  Topic Date Due   Zoster Vaccines- Shingrix (1 of 2) [birth year]19/1960   Diabetic kidney evaluation - Urine ACR  02/04/2025   FOOT EXAM  02/04/2025   HEMOGLOBIN A1C  02/04/2025   Diabetic kidney evaluation - eGFR measurement  08/07/2025   OPHTHALMOLOGY EXAM  08/26/2025   Medicare Annual Wellness (AWV)  10/08/2025   DTaP/Tdap/Td (3 - Td or Tdap) 05/08/2029   Pneumococcal Vaccine: 50+ Years  Completed   Influenza Vaccine  Completed   Bone Density Scan  Completed   Meningococcal B Vaccine  Aged Out   COVID-19 Vaccine  Discontinued        Assessment/Plan:  This is a routine wellness examination for Kelly Bell.  Patient Care Team: Norleen Lynwood ORN, MD as PCP - General (Internal Medicine) Barbarann Oneil BROCKS, MD (Inactive) (Orthopedic Surgery) Aron Shoulders, MD as Consulting Physician (General Surgery) Dewey Norleen, MD as Consulting Physician (Radiation Oncology) Tyree Nanetta SAILOR, RN as Registered Nurse Moses, Powell Hummer, NP as Nurse Practitioner (Hematology and Oncology) [place], My [place], [place] as Referring Physician (Optometry)  I have personally reviewed and noted the following in the patient's chart:   Medical and social history Use of alcohol, tobacco or illicit drugs  Current medications and supplements including opioid prescriptions. Functional ability and status Nutritional status Physical activity Advanced directives List of other physicians Hospitalizations, surgeries, and ER visits in previous 12 months Vitals Screenings to  include cognitive, depression, and falls Referrals and appointments  No orders of the defined types were placed in this encounter.  In addition, I have reviewed and discussed with patient certain preventive protocols, quality metrics, and best practice recommendations. A written personalized care plan for preventive services as well as general preventive health recommendations were provided to patient.   Kelly Bell, CMA   10/08/2024   Return in 1 year (on 10/08/2025).  After Visit Summary: (MyChart) Due to this being a telephonic visit, the after visit summary with patients personalized plan was offered to patient via MyChart   Nurse Notes: Scheduled 2026 AWV appt.

## 2024-10-31 ENCOUNTER — Other Ambulatory Visit: Payer: Self-pay | Admitting: Internal Medicine

## 2024-10-31 ENCOUNTER — Other Ambulatory Visit: Payer: Self-pay

## 2025-02-04 ENCOUNTER — Ambulatory Visit: Admitting: Internal Medicine

## 2025-10-14 ENCOUNTER — Ambulatory Visit
